# Patient Record
Sex: Female | Born: 1960 | Race: Black or African American | Hispanic: No | Marital: Single | Smoking: Current every day smoker
Health system: Southern US, Community
[De-identification: ages and names within clinical notes are randomized; demographics above are authoritative.]

## PROBLEM LIST (undated history)

## (undated) DIAGNOSIS — F329 Major depressive disorder, single episode, unspecified: Secondary | ICD-10-CM

## (undated) DIAGNOSIS — F419 Anxiety disorder, unspecified: Secondary | ICD-10-CM

## (undated) DIAGNOSIS — C50212 Malignant neoplasm of upper-inner quadrant of left female breast: Secondary | ICD-10-CM

## (undated) DIAGNOSIS — C50919 Malignant neoplasm of unspecified site of unspecified female breast: Secondary | ICD-10-CM

## (undated) DIAGNOSIS — F32A Depression, unspecified: Secondary | ICD-10-CM

## (undated) DIAGNOSIS — I10 Essential (primary) hypertension: Secondary | ICD-10-CM

## (undated) HISTORY — DX: Malignant neoplasm of unspecified site of unspecified female breast: C50.919

## (undated) HISTORY — DX: Malignant neoplasm of upper-inner quadrant of left female breast: C50.212

## (undated) HISTORY — PX: BREAST SURGERY: SHX581

---

## 1997-09-09 ENCOUNTER — Inpatient Hospital Stay (HOSPITAL_COMMUNITY): Admission: AD | Admit: 1997-09-09 | Discharge: 1997-09-09 | Payer: Self-pay | Admitting: Obstetrics & Gynecology

## 1997-11-27 ENCOUNTER — Other Ambulatory Visit: Admission: RE | Admit: 1997-11-27 | Discharge: 1997-11-27 | Payer: Self-pay | Admitting: Obstetrics and Gynecology

## 1997-11-29 ENCOUNTER — Inpatient Hospital Stay (HOSPITAL_COMMUNITY): Admission: AD | Admit: 1997-11-29 | Discharge: 1997-11-29 | Payer: Self-pay

## 1997-12-13 ENCOUNTER — Inpatient Hospital Stay (HOSPITAL_COMMUNITY): Admission: AD | Admit: 1997-12-13 | Discharge: 1997-12-13 | Payer: Self-pay | Admitting: Obstetrics and Gynecology

## 1998-02-13 ENCOUNTER — Other Ambulatory Visit: Admission: RE | Admit: 1998-02-13 | Discharge: 1998-02-13 | Payer: Self-pay | Admitting: Obstetrics and Gynecology

## 1998-02-27 ENCOUNTER — Ambulatory Visit (HOSPITAL_COMMUNITY): Admission: RE | Admit: 1998-02-27 | Discharge: 1998-02-27 | Payer: Self-pay | Admitting: Obstetrics and Gynecology

## 1998-03-25 ENCOUNTER — Inpatient Hospital Stay (HOSPITAL_COMMUNITY): Admission: AD | Admit: 1998-03-25 | Discharge: 1998-03-25 | Payer: Self-pay | Admitting: Obstetrics and Gynecology

## 1998-04-02 ENCOUNTER — Inpatient Hospital Stay (HOSPITAL_COMMUNITY): Admission: AD | Admit: 1998-04-02 | Discharge: 1998-04-02 | Payer: Self-pay | Admitting: Obstetrics and Gynecology

## 1998-04-09 ENCOUNTER — Inpatient Hospital Stay (HOSPITAL_COMMUNITY): Admission: AD | Admit: 1998-04-09 | Discharge: 1998-04-13 | Payer: Self-pay | Admitting: Obstetrics and Gynecology

## 1998-11-03 ENCOUNTER — Other Ambulatory Visit: Admission: RE | Admit: 1998-11-03 | Discharge: 1998-11-03 | Payer: Self-pay | Admitting: Obstetrics & Gynecology

## 2000-11-17 ENCOUNTER — Emergency Department (HOSPITAL_COMMUNITY): Admission: EM | Admit: 2000-11-17 | Discharge: 2000-11-17 | Payer: Self-pay | Admitting: Emergency Medicine

## 2000-11-17 ENCOUNTER — Encounter: Payer: Self-pay | Admitting: Emergency Medicine

## 2001-03-21 ENCOUNTER — Emergency Department (HOSPITAL_COMMUNITY): Admission: EM | Admit: 2001-03-21 | Discharge: 2001-03-21 | Payer: Self-pay | Admitting: Emergency Medicine

## 2002-03-09 ENCOUNTER — Encounter (INDEPENDENT_AMBULATORY_CARE_PROVIDER_SITE_OTHER): Payer: Self-pay | Admitting: *Deleted

## 2002-03-09 ENCOUNTER — Encounter (INDEPENDENT_AMBULATORY_CARE_PROVIDER_SITE_OTHER): Payer: Self-pay | Admitting: Family Medicine

## 2002-11-27 ENCOUNTER — Emergency Department (HOSPITAL_COMMUNITY): Admission: EM | Admit: 2002-11-27 | Discharge: 2002-11-27 | Payer: Self-pay | Admitting: *Deleted

## 2002-11-27 ENCOUNTER — Encounter: Payer: Self-pay | Admitting: *Deleted

## 2003-02-22 ENCOUNTER — Encounter: Admission: RE | Admit: 2003-02-22 | Discharge: 2003-02-22 | Payer: Self-pay | Admitting: Family Medicine

## 2003-12-12 ENCOUNTER — Inpatient Hospital Stay (HOSPITAL_COMMUNITY): Admission: AD | Admit: 2003-12-12 | Discharge: 2003-12-12 | Payer: Self-pay | Admitting: Obstetrics & Gynecology

## 2003-12-16 ENCOUNTER — Inpatient Hospital Stay (HOSPITAL_COMMUNITY): Admission: AD | Admit: 2003-12-16 | Discharge: 2003-12-16 | Payer: Self-pay | Admitting: Obstetrics and Gynecology

## 2004-01-03 ENCOUNTER — Inpatient Hospital Stay (HOSPITAL_COMMUNITY): Admission: AD | Admit: 2004-01-03 | Discharge: 2004-01-03 | Payer: Self-pay | Admitting: *Deleted

## 2004-06-09 ENCOUNTER — Inpatient Hospital Stay (HOSPITAL_COMMUNITY): Admission: AD | Admit: 2004-06-09 | Discharge: 2004-06-09 | Payer: Self-pay | Admitting: *Deleted

## 2005-08-11 ENCOUNTER — Emergency Department (HOSPITAL_COMMUNITY): Admission: EM | Admit: 2005-08-11 | Discharge: 2005-08-11 | Payer: Self-pay | Admitting: Emergency Medicine

## 2006-10-06 DIAGNOSIS — M2390 Unspecified internal derangement of unspecified knee: Secondary | ICD-10-CM

## 2006-10-07 ENCOUNTER — Encounter (INDEPENDENT_AMBULATORY_CARE_PROVIDER_SITE_OTHER): Payer: Self-pay | Admitting: *Deleted

## 2006-11-10 ENCOUNTER — Ambulatory Visit: Payer: Self-pay | Admitting: Internal Medicine

## 2006-12-12 ENCOUNTER — Ambulatory Visit: Payer: Self-pay | Admitting: Internal Medicine

## 2006-12-19 ENCOUNTER — Ambulatory Visit: Payer: Self-pay | Admitting: Internal Medicine

## 2006-12-19 DIAGNOSIS — E785 Hyperlipidemia, unspecified: Secondary | ICD-10-CM

## 2006-12-19 DIAGNOSIS — E782 Mixed hyperlipidemia: Secondary | ICD-10-CM | POA: Insufficient documentation

## 2006-12-19 LAB — CONVERTED CEMR LAB
Cholesterol: 189 mg/dL
Creatinine, Ser: 0.91 mg/dL
HDL: 52 mg/dL
Triglycerides: 173 mg/dL

## 2007-01-06 ENCOUNTER — Ambulatory Visit: Payer: Self-pay | Admitting: Internal Medicine

## 2007-01-06 LAB — CONVERTED CEMR LAB: Platelets: 285 10*3/uL

## 2007-03-09 ENCOUNTER — Encounter (INDEPENDENT_AMBULATORY_CARE_PROVIDER_SITE_OTHER): Payer: Self-pay | Admitting: Internal Medicine

## 2007-03-09 DIAGNOSIS — F3160 Bipolar disorder, current episode mixed, unspecified: Secondary | ICD-10-CM | POA: Insufficient documentation

## 2007-03-09 DIAGNOSIS — F17200 Nicotine dependence, unspecified, uncomplicated: Secondary | ICD-10-CM | POA: Insufficient documentation

## 2007-03-09 DIAGNOSIS — R030 Elevated blood-pressure reading, without diagnosis of hypertension: Secondary | ICD-10-CM | POA: Insufficient documentation

## 2007-06-22 ENCOUNTER — Encounter (INDEPENDENT_AMBULATORY_CARE_PROVIDER_SITE_OTHER): Payer: Self-pay | Admitting: Family Medicine

## 2008-05-22 ENCOUNTER — Ambulatory Visit: Payer: Self-pay | Admitting: Family Medicine

## 2008-05-22 DIAGNOSIS — N644 Mastodynia: Secondary | ICD-10-CM

## 2008-05-22 DIAGNOSIS — M7120 Synovial cyst of popliteal space [Baker], unspecified knee: Secondary | ICD-10-CM | POA: Insufficient documentation

## 2008-05-23 ENCOUNTER — Encounter (INDEPENDENT_AMBULATORY_CARE_PROVIDER_SITE_OTHER): Payer: Self-pay | Admitting: Family Medicine

## 2008-05-23 ENCOUNTER — Ambulatory Visit (HOSPITAL_COMMUNITY): Admission: RE | Admit: 2008-05-23 | Discharge: 2008-05-23 | Payer: Self-pay | Admitting: Family Medicine

## 2008-05-24 ENCOUNTER — Ambulatory Visit: Payer: Self-pay | Admitting: Vascular Surgery

## 2008-05-24 ENCOUNTER — Encounter (INDEPENDENT_AMBULATORY_CARE_PROVIDER_SITE_OTHER): Payer: Self-pay | Admitting: Family Medicine

## 2008-05-24 ENCOUNTER — Ambulatory Visit (HOSPITAL_COMMUNITY): Admission: RE | Admit: 2008-05-24 | Discharge: 2008-05-24 | Payer: Self-pay | Admitting: Family Medicine

## 2008-05-24 ENCOUNTER — Telehealth (INDEPENDENT_AMBULATORY_CARE_PROVIDER_SITE_OTHER): Payer: Self-pay | Admitting: *Deleted

## 2008-06-11 ENCOUNTER — Encounter (INDEPENDENT_AMBULATORY_CARE_PROVIDER_SITE_OTHER): Payer: Self-pay | Admitting: Family Medicine

## 2008-06-13 ENCOUNTER — Telehealth (INDEPENDENT_AMBULATORY_CARE_PROVIDER_SITE_OTHER): Payer: Self-pay | Admitting: Internal Medicine

## 2008-06-27 ENCOUNTER — Emergency Department (HOSPITAL_COMMUNITY): Admission: EM | Admit: 2008-06-27 | Discharge: 2008-06-27 | Payer: Self-pay | Admitting: Emergency Medicine

## 2008-10-21 ENCOUNTER — Telehealth (INDEPENDENT_AMBULATORY_CARE_PROVIDER_SITE_OTHER): Payer: Self-pay | Admitting: *Deleted

## 2015-06-09 ENCOUNTER — Encounter: Payer: Self-pay | Admitting: *Deleted

## 2015-06-09 ENCOUNTER — Telehealth: Payer: Self-pay | Admitting: *Deleted

## 2015-06-09 DIAGNOSIS — Z170 Estrogen receptor positive status [ER+]: Secondary | ICD-10-CM

## 2015-06-09 DIAGNOSIS — C50212 Malignant neoplasm of upper-inner quadrant of left female breast: Secondary | ICD-10-CM

## 2015-06-09 HISTORY — DX: Malignant neoplasm of upper-inner quadrant of left female breast: C50.212

## 2015-06-09 NOTE — Telephone Encounter (Signed)
Left vm for pt to return call to give detailed instructions for [facility] on 11/2. Janett Billow at [place] confirmed appt with pt on 10/28 for 11/2 at 0830.

## 2015-06-10 ENCOUNTER — Telehealth: Payer: Self-pay | Admitting: *Deleted

## 2015-06-10 NOTE — Telephone Encounter (Signed)
Confirmed appt for [facility] on 11/2 at 0830. Gave instructions and directions.

## 2015-06-11 ENCOUNTER — Other Ambulatory Visit: Payer: Self-pay | Admitting: *Deleted

## 2015-06-11 ENCOUNTER — Encounter: Payer: Self-pay | Admitting: *Deleted

## 2015-06-11 ENCOUNTER — Ambulatory Visit (HOSPITAL_BASED_OUTPATIENT_CLINIC_OR_DEPARTMENT_OTHER): Payer: Medicaid Other | Admitting: Oncology

## 2015-06-11 ENCOUNTER — Encounter: Payer: Self-pay | Admitting: Oncology

## 2015-06-11 ENCOUNTER — Ambulatory Visit: Payer: Medicaid Other | Admitting: Physical Therapy

## 2015-06-11 ENCOUNTER — Ambulatory Visit
Admission: RE | Admit: 2015-06-11 | Discharge: 2015-06-11 | Disposition: A | Discharge status: Home / Self Care | Payer: Medicaid Other | Source: Ambulatory Visit | Attending: Radiation Oncology | Admitting: Radiation Oncology

## 2015-06-11 ENCOUNTER — Other Ambulatory Visit: Payer: Self-pay | Admitting: General Surgery

## 2015-06-11 ENCOUNTER — Other Ambulatory Visit (HOSPITAL_BASED_OUTPATIENT_CLINIC_OR_DEPARTMENT_OTHER): Payer: Medicaid Other

## 2015-06-11 ENCOUNTER — Encounter: Payer: Self-pay | Admitting: Skilled Nursing Facility1

## 2015-06-11 VITALS — BP 136/78 | HR 84 | Temp 98.5°F | Resp 18 | Ht 67.0 in | Wt 134.4 lb

## 2015-06-11 DIAGNOSIS — C50212 Malignant neoplasm of upper-inner quadrant of left female breast: Secondary | ICD-10-CM

## 2015-06-11 DIAGNOSIS — Z720 Tobacco use: Secondary | ICD-10-CM | POA: Diagnosis not present

## 2015-06-11 DIAGNOSIS — Z170 Estrogen receptor positive status [ER+]: Secondary | ICD-10-CM | POA: Diagnosis not present

## 2015-06-11 DIAGNOSIS — F3160 Bipolar disorder, current episode mixed, unspecified: Secondary | ICD-10-CM

## 2015-06-11 DIAGNOSIS — F319 Bipolar disorder, unspecified: Secondary | ICD-10-CM | POA: Diagnosis not present

## 2015-06-11 DIAGNOSIS — C50112 Malignant neoplasm of central portion of left female breast: Secondary | ICD-10-CM

## 2015-06-11 DIAGNOSIS — F17200 Nicotine dependence, unspecified, uncomplicated: Secondary | ICD-10-CM

## 2015-06-11 LAB — CBC WITH DIFFERENTIAL/PLATELET
BASO%: 0.4 % (ref 0.0–2.0)
Basophils Absolute: 0 10*3/uL (ref 0.0–0.1)
EOS%: 1.1 % (ref 0.0–7.0)
Eosinophils Absolute: 0.1 10*3/uL (ref 0.0–0.5)
HEMATOCRIT: 40.9 % (ref 34.8–46.6)
HEMOGLOBIN: 13.8 g/dL (ref 11.6–15.9)
LYMPH#: 3.5 10*3/uL — AB (ref 0.9–3.3)
LYMPH%: 46.9 % (ref 14.0–49.7)
MCH: 30 pg (ref 25.1–34.0)
MCHC: 33.8 g/dL (ref 31.5–36.0)
MCV: 88.6 fL (ref 79.5–101.0)
MONO#: 0.6 10*3/uL (ref 0.1–0.9)
MONO%: 8.6 % (ref 0.0–14.0)
NEUT#: 3.2 10*3/uL (ref 1.5–6.5)
NEUT%: 43 % (ref 38.4–76.8)
Platelets: 247 10*3/uL (ref 145–400)
RBC: 4.62 10*6/uL (ref 3.70–5.45)
RDW: 14.4 % (ref 11.2–14.5)
WBC: 7.4 10*3/uL (ref 3.9–10.3)

## 2015-06-11 LAB — COMPREHENSIVE METABOLIC PANEL (CC13)
ALBUMIN: 4.4 g/dL (ref 3.5–5.0)
ALK PHOS: 94 U/L (ref 40–150)
ALT: 17 U/L (ref 0–55)
AST: 16 U/L (ref 5–34)
Anion Gap: 9 mEq/L (ref 3–11)
BUN: 15.6 mg/dL (ref 7.0–26.0)
CALCIUM: 10.5 mg/dL — AB (ref 8.4–10.4)
CHLORIDE: 108 meq/L (ref 98–109)
CO2: 26 mEq/L (ref 22–29)
CREATININE: 0.9 mg/dL (ref 0.6–1.1)
EGFR: 83 mL/min/{1.73_m2} — ABNORMAL LOW (ref >90)
GLUCOSE: 122 mg/dL (ref 70–140)
Potassium: 3.7 mEq/L (ref 3.5–5.1)
Sodium: 143 mEq/L (ref 136–145)
Total Bilirubin: 0.37 mg/dL (ref 0.20–1.20)
Total Protein: 7.4 g/dL (ref 6.4–8.3)

## 2015-06-11 NOTE — Progress Notes (Signed)
[place]  Telephone:([phone] Fax:([phone]) [phone]     ID: LAKEYA MULKA DOB: Oct 07, 1960  MR#: [id number]  EGB#:[id number]  Patient Care Team: Mack Hook, MD as PCP - General [name], MD as Consulting Physician (General Surgery) Chauncey Cruel, MD as Consulting Physician (Oncology) Arloa Koh, MD as Consulting Physician (Radiation Oncology) Mauro Kaufmann, RN as Registered Nurse Rockwell Germany, RN as Registered Nurse PCP: Mack Hook, MD GYN: OTHER MD: Lucianne Lei MD  CHIEF COMPLAINT: Locally advanced breast cancer  CURRENT TREATMENT: Awaiting definitive surgery   BREAST CANCER HISTORY: Chloee tells me she has always had many breast cysts, and that the 1 in the left breast that turned out to be cancer didn't seem any different from any of the other ones. She did notice some skin dimpling so after a few months she brought it to the attention of Dr. Criss Rosales and on 05/28/2015 she underwent bilateral diagnostic mammography with tomosynthesis and left breast ultrasonography at [facility]. The most recent mammogram prior to this was 2009. The breast density was category C. In the left breast upper inner quadrant there was a 4 cm irregular mass associated with architectural distortion and nipple retraction. By ultrasound this measured 4 cm, and had micro-lobulated margins. There was a lymph node in the left axilla with a focus of cortical thickening.  On 06/05/2015 the patient underwent biopsy of the breast mass in question. This showed ([id number] [id number]) and invasive ductal carcinoma, grade 2 or 3, estrogen receptor 95% positive, progesterone receptor 40% positive, both with strong staining intensity, with an MIB-1 of 20%, and HER-2 equivocal for amplification, the signals ratio being 1.43 but the number per cell 4.38. Additional studies are pending to clarify the HER-2 status.  Biopsy of the abnormal lymph node in the left axilla was also scheduled for  06/05/2015 but the patient refused  The patient's subsequent history is as detailed below  INTERVAL HISTORY: Shauna was evaluated in the multidisciplinary breast cancer clinic 06/11/2015 accompanied by her parents and by her son Dallas Breeding.Her case was also presented in the multidisciplinary breast cancer conference that same morning. At that time a preliminary plan was proposed: Consideration of an MRI, neoadjuvant chemotherapy, and port placement with lymph node biopsy  REVIEW OF SYSTEMS: Aside from the mass itself, there were no specific symptoms leading to the original mammogram, which was routinely scheduled. The patient denies unusual headaches, visual changes, nausea, vomiting, stiff neck, dizziness, or gait imbalance. There has been no cough, phlegm production, or pleurisy, no chest pain or pressure, and no change in bowel or bladder habits. The patient denies fever, rash, bleeding, unexplained fatigue or unexplained weight loss. She does complain of hot flashes. She does not exercise regularly. She continues to smoke greater than a pack per day. A detailed review of systems was otherwise entirely negative.   PAST MEDICAL HISTORY: Past Medical History  Diagnosis Date  . Breast cancer of upper-inner quadrant of left female breast ([place]) 06/09/2015  . Breast cancer ([place])   . Bipolar disorder ([place])     PAST SURGICAL HISTORY: History reviewed. No pertinent past surgical history.  FAMILY HISTORY History reviewed. No pertinent family history. The patient's parents are in their mid to late 1s. [name] had 4 brothers, 2 sisters. There is a history of prostate and brain cancer on the maternal side but no history of ovarian or breast cancer  GYNECOLOGIC HISTORY:  No LMP recorded. Menarche age 3, first live birth age 70. The patient is  [place] P2. She stopped having periods at age 26. She did not take hormone replacement. She never used oral contraceptives.  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 At home is just she and her "baby" [name], 58, who is going to school. Son Osnabrock, 20, works for the [employer] and lives in [place]. The patient has 2 grandchildren. She had dense a local [employer] (harvest gathering".    ADVANCED DIRECTIVES: Not in place. At the initial clinic visit 06/11/2015 the patient was given the appropriate forms to complete and notarize at her discretion.     HEALTH MAINTENANCE: Social History  Substance Use Topics  . Smoking status: Current Every Day Smoker -- 1.00 packs/day    Types: Cigarettes  . Smokeless tobacco: None  . Alcohol Use: 0.6 oz/week    1 Glasses of wine per week     Colonoscopy: Never  PAP:  Bone density: Never  Lipid panel:  Allergies  Allergen Reactions  . Codeine     REACTION: unknown reaction    No current outpatient prescriptions on file.   No current facility-administered medications for this visit.    OBJECTIVE: Middle-aged African-American woman who appears stated age 54 Vitals:   06/11/15 0902  BP: 136/78  Pulse: 84  Temp: 98.5 F (36.9 C)  Resp: 18     Body mass index is 21.04 kg/(m^2).    ECOG FS:1 - Symptomatic but completely ambulatory  Ocular: Sclerae unicteric, extraocular movements intact Ear-nose-throat: Oropharynx clear and moist Lymphatic: No cervical or supraclavicular adenopathy Lungs no rales or rhonchi, good excursion bilaterally Heart regular rate and rhythm, no murmur appreciated Abd soft, nontender, positive bowel sounds, no masses appreciated MSK no focal spinal tenderness, no joint edema Neuro: non-focal, well-oriented, wary and distrustful alternating with appropriate affect Breasts: The right breast is unremarkable. There is an easily palpable mass in the left breast upper inner quadrant, measuring approximately 4 cm. There is no overlying erythema. There is mild skin dimpling. I do not palpate an axillary  lymph node.   LAB RESULTS:  CMP     Component Value Date/Time   NA 143 06/11/2015 0849   K 3.7 06/11/2015 0849   CO2 26 06/11/2015 0849   GLUCOSE 122 06/11/2015 0849   BUN 15.6 06/11/2015 0849   CREATININE 0.9 06/11/2015 0849   CREATININE 0.91 12/19/2006   CALCIUM 10.5* 06/11/2015 0849   PROT 7.4 06/11/2015 0849   ALBUMIN 4.4 06/11/2015 0849   AST 16 06/11/2015 0849   ALT 17 06/11/2015 0849   ALKPHOS 94 06/11/2015 0849   BILITOT 0.37 06/11/2015 0849    INo results found for: SPEP, UPEP  Lab Results  Component Value Date   WBC 7.4 06/11/2015   NEUTROABS 3.2 06/11/2015   HGB 13.8 06/11/2015   HCT 40.9 06/11/2015   MCV 88.6 06/11/2015   PLT 247 06/11/2015      Chemistry      Component Value Date/Time   NA 143 06/11/2015 0849   K 3.7 06/11/2015 0849   CO2 26 06/11/2015 0849   BUN 15.6 06/11/2015 0849   CREATININE 0.9 06/11/2015 0849   CREATININE 0.91 12/19/2006      Component Value Date/Time   CALCIUM 10.5* 06/11/2015 0849   ALKPHOS 94 06/11/2015 0849   AST 16 06/11/2015 0849   ALT 17 06/11/2015 0849   BILITOT 0.37 06/11/2015 0849       No results  found for: LABCA2  No components found for: WTUUE280  No results for input(s): INR in the last 168 hours.  Urinalysis No results found for: COLORURINE, APPEARANCEUR, LABSPEC, PHURINE, GLUCOSEU, HGBUR, BILIRUBINUR, KETONESUR, PROTEINUR, UROBILINOGEN, NITRITE, LEUKOCYTESUR  STUDIES: No results found.  ASSESSMENT: 54 y.o. [place] woman status post left breast biopsy 06/05/2015 for a clinical T2 NX, stage II or 3 invasive ductal carcinoma, estrogen and progesterone receptor positive, with an MIB-1 of 20%, and equivocal HER-2 amplification  (a) the patient refused biopsy of a suspicious left axillary lymph node  (1) definitive surgery planned as left mastectomy with sentinel lymph node sampling with concurrent port placement  (2) adjuvant chemotherapy with trastuzuma/ pertuzumab as appropriate to follow  surgery  (3) adjuvant radiation to follow chemotherapy as appropriate  (4) adjuvant anti-estrogens to follow local treatment  (5) bipolar disorder: The patient refuses medication at this time  (6) tobacco abuse disorder: The patient has been strongly advised to quit  PLAN: We spent the better part of today's hour-long appointment discussing the biology of breast cancer in general, and the specifics of the patient's tumor in particular. Shirlean Mylar understands she has locally advanced breast cancer. If she would allow [place] to biopsy her axilla, we would be able to give her a more definitive stage. However she is reluctant to have any procedures including needle sticks for access performed. This I think is going to impact our planning.   In general, we recommend lumpectomy if at all possible, because there is no survival advantage as compared to mastectomy so long as radiation follows. If there are positive margins we go back and do additional surgery. With Tuana I think were going to have one chance to cure her. If she has a lumpectomy with positive margins and refuses further surgery or if she has lumpectomy and refuses radiation, then the chance of cure would be compromised. Taking also into account the fact that the mass in her breast is large, and her breast itself is small, so that cosmetically we would not get a good result from a wide lumpectomy, my recommendation to her is to proceed with mastectomy and sentinel lymph node sampling. She can have the port placed at the same time This would be followed by chemotherapy, which assuming her echocardiogram is normal would consist of the standard doxorubicin/ cyclophosphamide in dose dense fashion followed by weekly paclitaxel 12. This would be followed by radiation is appropriate.  Once local treatment has been completed, we would start anti-estrogens, most likely with anastrozole. That will be continued for a minimum of 5 years.  In addition I think  she would benefit from a PET scan to make sure we are not dealing with stage IV disease. Again she is reluctant to undergo any further testing or any procedure that involves pain even as minimal as needle access. Nevertheless my recommendation to her is that we start by making sure she does not have stage IV disease.  I discussed antidepressants with Saniyyah. She tells me she has tried many in the past and not of the more. They make things worse instead of better. That is why she does not take them in why she does not want to see a psychiatrist or counselor. We also discussed smoking cessation. This is not something she feels she can do now because she is too anxious given her recent diagnosis. She understands this may affect her healing from the upcoming surgery  The patient has a good understanding of the overall plan,  Which was given to her in writing. At this point she agrees with it. She knows the goal of treatment in her case is cure. She will call with any problems that may develop before her next visit here.  Chauncey Cruel, MD   06/11/2015 12:34 PM Medical Oncology and Hematology [facility] [address] [place], [place] [address] Tel. [phone]    Fax. [phone]

## 2015-06-11 NOTE — Progress Notes (Signed)
Subjective:     Patient ID: Catherine [name], female   DOB: Sep 28, 1960, 54 y.o.   MRN: [id number]  HPI   Review of Systems     Objective:   Physical Exam For the patient to understand and be given the tools to implement a healthy plant based diet during their cancer diagnosis.     Assessment:     Dr. Barry Dienes indicates the pt would not allow her to examine her and the physical therapist also states the pt would not allow her to examine her. The pt is not in a place of understanding or listening. Upon entering the room the pt continued to stare off into the corner and had her body turned toward the corner. Pts labs: calcium 10.5. Pts ht 5'7'', 134 pounds, BMI 21.1.      Plan:     Dietitian offered the folder of nutrition information to the pts son which he accepted.

## 2015-06-11 NOTE — Progress Notes (Signed)
Chart note: The patient declined to see me in consultation.  I spoke briefly with her son and father, and they understand recommendations for surgery to be followed by adjuvant chemotherapy.  Post mastectomy radiation therapy may or may not be indicated.  This will depend on her pathologic stage.

## 2015-06-11 NOTE — Progress Notes (Signed)
Clinical Social Work [place] Psychosocial Distress Screening [place]  Patient did not completed distress screening protocol, but presented very anxious and overwhelmed.  Clinical Social Worker met with patient and patients family in [facility] to assess for distress and other psychosocial needs. Patient did not communicate with CSW, but her family was very open to resources education and information. CSW offered support to patient and family and discussed common feeling and emotions when being diagnosed with cancer, and the importance of support during treatment. CSW informed patient of the support team and support services at [facility]. CSW provided contact information and encouraged patient to call with any questions or concerns.  Johnnye Lana, MSW, LCSW, OSW-C Clinical Social Worker [facility] [phone]

## 2015-06-12 ENCOUNTER — Telehealth: Payer: Self-pay | Admitting: *Deleted

## 2015-06-12 NOTE — Telephone Encounter (Signed)
Pt called to express a deep apology for her actions during clinic yesterday. Relate "my mind was in the wrong place". Validated pt actions and expressed receiving a cancer dx is upsetting and we are here to take very good care of her. Pt relate she would like to speak to Dr. Barry Dienes concerning surgery. I have reached out to Dr. Barry Dienes for her to call pt at number [phone] Gave pt encouragement and emotional support. Informed pt to call with needs or questions. Received verbal understanding.

## 2015-06-16 ENCOUNTER — Telehealth: Payer: Self-pay | Admitting: *Deleted

## 2015-06-16 NOTE — Telephone Encounter (Signed)
Pt called in question of scan. Discussed PET scan and need. Pt relate she would like to speak to Dr. Barry Dienes concerning "my tumor is a lot small than when I had my mammagram'". Spoke to Dr. Barry Dienes with pt concerns. Called central scheduling to discuss calling pt at new number to schedule her PET scan. Denies further needs at this time. Encourage pt to call with questions or concerns. Received verbal understanding.

## 2015-06-17 ENCOUNTER — Telehealth: Payer: Self-pay | Admitting: Oncology

## 2015-06-17 NOTE — Telephone Encounter (Signed)
Appointments made for echo and called and left a message with date and time

## 2015-06-19 ENCOUNTER — Ambulatory Visit (HOSPITAL_COMMUNITY): Admission: RE | Admit: 2015-06-19 | Payer: Medicaid Other | Source: Ambulatory Visit

## 2015-06-20 ENCOUNTER — Telehealth: Payer: Self-pay | Admitting: *Deleted

## 2015-06-20 NOTE — Telephone Encounter (Signed)
Called pt to assess needs relate doing well and without complaints.

## 2015-06-23 ENCOUNTER — Telehealth: Payer: Self-pay | Admitting: *Deleted

## 2015-06-23 NOTE — Telephone Encounter (Signed)
Called pt to schedule Echo. Pt relate she would like to have it on the 18th around the time of her pre-op appt. Pt request return call tomorrow with appt.

## 2015-06-24 ENCOUNTER — Telehealth: Payer: Self-pay | Admitting: *Deleted

## 2015-06-24 NOTE — Telephone Encounter (Signed)
Scheduled and confirmed echo appt with pt for 11/18 at 10:00 at [facility]. Gave instructions and directions. Denies further needs at this time.

## 2015-06-27 ENCOUNTER — Encounter (HOSPITAL_COMMUNITY)
Admission: RE | Admit: 2015-06-27 | Discharge: 2015-06-27 | Disposition: A | Discharge status: Home / Self Care | Payer: Medicaid Other | Source: Ambulatory Visit | Attending: General Surgery | Admitting: General Surgery

## 2015-06-27 ENCOUNTER — Ambulatory Visit (HOSPITAL_COMMUNITY)
Admission: RE | Admit: 2015-06-27 | Discharge: 2015-06-27 | Disposition: A | Discharge status: Home / Self Care | Payer: Medicaid Other | Source: Ambulatory Visit | Attending: Oncology | Admitting: Oncology

## 2015-06-27 ENCOUNTER — Encounter (HOSPITAL_COMMUNITY): Payer: Self-pay

## 2015-06-27 ENCOUNTER — Encounter: Payer: Self-pay | Admitting: *Deleted

## 2015-06-27 DIAGNOSIS — Z01812 Encounter for preprocedural laboratory examination: Secondary | ICD-10-CM | POA: Insufficient documentation

## 2015-06-27 DIAGNOSIS — C50912 Malignant neoplasm of unspecified site of left female breast: Secondary | ICD-10-CM | POA: Diagnosis not present

## 2015-06-27 DIAGNOSIS — C50112 Malignant neoplasm of central portion of left female breast: Secondary | ICD-10-CM

## 2015-06-27 HISTORY — DX: Anxiety disorder, unspecified: F41.9

## 2015-06-27 HISTORY — DX: Depression, unspecified: F32.A

## 2015-06-27 HISTORY — DX: Major depressive disorder, single episode, unspecified: F32.9

## 2015-06-27 LAB — BASIC METABOLIC PANEL
Anion gap: 7 (ref 5–15)
BUN: 14 mg/dL (ref 6–20)
CHLORIDE: 110 mmol/L (ref 101–111)
CO2: 26 mmol/L (ref 22–32)
CREATININE: 0.94 mg/dL (ref 0.44–1.00)
Calcium: 10.1 mg/dL (ref 8.9–10.3)
GFR calc Af Amer: >60 mL/min (ref >60)
GFR calc non Af Amer: >60 mL/min (ref >60)
GLUCOSE: 122 mg/dL — AB (ref 65–99)
POTASSIUM: 3.6 mmol/L (ref 3.5–5.1)
SODIUM: 143 mmol/L (ref 135–145)

## 2015-06-27 LAB — CBC
HCT: 41.4 % (ref 36.0–46.0)
HEMOGLOBIN: 14.2 g/dL (ref 12.0–15.0)
MCH: 30.4 pg (ref 26.0–34.0)
MCHC: 34.3 g/dL (ref 30.0–36.0)
MCV: 88.7 fL (ref 78.0–100.0)
Platelets: 244 10*3/uL (ref 150–400)
RBC: 4.67 MIL/uL (ref 3.87–5.11)
RDW: 14.9 % (ref 11.5–15.5)
WBC: 8.6 10*3/uL (ref 4.0–10.5)

## 2015-06-27 NOTE — Pre-Procedure Instructions (Signed)
    Catherine [name]  06/27/2015      [facility], [place] [address] Phone: [phone] Fax: [phone]    Your procedure is scheduled on 07/01/15.  Report to [facility] Admitting at 8 A.M.  Call this number if you have problems the morning of surgery:  [phone]   Remember:  Do not eat food or drink liquids after midnight.  Take these medicines the morning of surgery with A SIP OF WATER --none   Do not wear jewelry, make-up or nail polish.  Do not wear lotions, powders, or perfumes.  You may wear deodorant.  Do not shave 48 hours prior to surgery.  Men may shave face and neck.  Do not bring valuables to the hospital.  [facility] is not responsible for any belongings or valuables.  Contacts, dentures or bridgework may not be worn into surgery.  Leave your suitcase in the car.  After surgery it may be brought to your room.  For patients admitted to the hospital, discharge time will be determined by your treatment team.  Patients discharged the day of surgery will not be allowed to drive home.   Name and phone number of your driver:   Special instructions:    Please read over the following fact sheets that you were given. Pain Booklet, Coughing and Deep Breathing and Surgical Site Infection Prevention

## 2015-06-27 NOTE — Progress Notes (Signed)
Pt called to relay her pre-admission testing was moved to 9:00AM and that she needed to make sure she would be able to have echo right afterwards. Relayed that I still saw her appt for 1100 but would call and make sure appt did indeed get changed. Pt relay she would not come back. Called pre-admitting and echo to discuss working pt in. Departments relayed they would try and work her in. Called pt to let her know that they were trying to work her in.

## 2015-06-28 NOTE — Progress Notes (Signed)
Patient called requesting wording on consent. Patient then got upset and started to cry on phone. I listed to patient and what she was currently going through. I advised patient that she was not alone in her journey. Patient mentioned she was very comfortable talking with her navigator Dawn and I advised patient to contact her or Vernie Shanks if she was feeling overwhelmed. Patient reports that she will call [place].

## 2015-06-30 ENCOUNTER — Encounter (HOSPITAL_COMMUNITY): Payer: Medicaid Other

## 2015-06-30 MED ORDER — CEFAZOLIN SODIUM-DEXTROSE 2-3 GM-% IV SOLR
2.0000 g | INTRAVENOUS | Status: AC
  Administered 2015-07-01: 2 g via INTRAVENOUS
  Filled 2015-06-30: qty 50

## 2015-07-01 ENCOUNTER — Encounter (HOSPITAL_COMMUNITY): Payer: Self-pay | Admitting: *Deleted

## 2015-07-01 ENCOUNTER — Ambulatory Visit (HOSPITAL_COMMUNITY): Payer: Medicaid Other | Admitting: Anesthesiology

## 2015-07-01 ENCOUNTER — Encounter (HOSPITAL_COMMUNITY)
Admission: RE | Disposition: A | Discharge status: Home / Self Care | Payer: Self-pay | Source: Ambulatory Visit | Attending: General Surgery

## 2015-07-01 ENCOUNTER — Inpatient Hospital Stay (HOSPITAL_COMMUNITY)
Admission: RE | Admit: 2015-07-01 | Discharge: 2015-07-04 | DRG: 580 | Disposition: A | Discharge status: Home / Self Care | Payer: Medicaid Other | Source: Ambulatory Visit | Attending: General Surgery | Admitting: General Surgery

## 2015-07-01 ENCOUNTER — Ambulatory Visit (HOSPITAL_COMMUNITY): Payer: Medicaid Other

## 2015-07-01 DIAGNOSIS — Y838 Other surgical procedures as the cause of abnormal reaction of the patient, or of later complication, without mention of misadventure at the time of the procedure: Secondary | ICD-10-CM | POA: Diagnosis not present

## 2015-07-01 DIAGNOSIS — E7800 Pure hypercholesterolemia, unspecified: Secondary | ICD-10-CM | POA: Diagnosis present

## 2015-07-01 DIAGNOSIS — F319 Bipolar disorder, unspecified: Secondary | ICD-10-CM | POA: Diagnosis present

## 2015-07-01 DIAGNOSIS — C50212 Malignant neoplasm of upper-inner quadrant of left female breast: Principal | ICD-10-CM | POA: Diagnosis present

## 2015-07-01 DIAGNOSIS — N63 Unspecified lump in unspecified breast: Secondary | ICD-10-CM

## 2015-07-01 DIAGNOSIS — N6453 Retraction of nipple: Secondary | ICD-10-CM | POA: Diagnosis present

## 2015-07-01 DIAGNOSIS — Z8249 Family history of ischemic heart disease and other diseases of the circulatory system: Secondary | ICD-10-CM

## 2015-07-01 DIAGNOSIS — F17210 Nicotine dependence, cigarettes, uncomplicated: Secondary | ICD-10-CM | POA: Diagnosis present

## 2015-07-01 DIAGNOSIS — D500 Iron deficiency anemia secondary to blood loss (chronic): Secondary | ICD-10-CM | POA: Diagnosis not present

## 2015-07-01 DIAGNOSIS — Z170 Estrogen receptor positive status [ER+]: Secondary | ICD-10-CM

## 2015-07-01 DIAGNOSIS — C50912 Malignant neoplasm of unspecified site of left female breast: Secondary | ICD-10-CM | POA: Diagnosis present

## 2015-07-01 DIAGNOSIS — I959 Hypotension, unspecified: Secondary | ICD-10-CM | POA: Diagnosis not present

## 2015-07-01 DIAGNOSIS — M96841 Postprocedural hematoma of a musculoskeletal structure following other procedure: Secondary | ICD-10-CM

## 2015-07-01 HISTORY — PX: MASTECTOMY W/ SENTINEL NODE BIOPSY: SHX2001

## 2015-07-01 HISTORY — PX: PORTACATH PLACEMENT: SHX2246

## 2015-07-01 SURGERY — MASTECTOMY WITH SENTINEL LYMPH NODE BIOPSY
Anesthesia: Regional

## 2015-07-01 MED ORDER — SUCCINYLCHOLINE CHLORIDE 20 MG/ML IJ SOLN
INTRAMUSCULAR | Status: DC | PRN
  Administered 2015-07-01: 100 mg via INTRAVENOUS

## 2015-07-01 MED ORDER — PHENYLEPHRINE HCL 10 MG/ML IJ SOLN
10.0000 mg | INTRAVENOUS | Status: DC | PRN
  Administered 2015-07-01: 20 ug/min via INTRAVENOUS

## 2015-07-01 MED ORDER — PHENYLEPHRINE 40 MCG/ML (10ML) SYRINGE FOR IV PUSH (FOR BLOOD PRESSURE SUPPORT)
PREFILLED_SYRINGE | INTRAVENOUS | Status: AC
  Filled 2015-07-01: qty 10

## 2015-07-01 MED ORDER — SODIUM CHLORIDE 0.9 % IJ SOLN
INTRAMUSCULAR | Status: DC | PRN
  Administered 2015-07-01: 70 mL via INTRAVENOUS

## 2015-07-01 MED ORDER — DEXAMETHASONE SODIUM PHOSPHATE 10 MG/ML IJ SOLN
INTRAMUSCULAR | Status: DC | PRN
  Administered 2015-07-01: 10 mg via INTRAVENOUS

## 2015-07-01 MED ORDER — SUCCINYLCHOLINE CHLORIDE 20 MG/ML IJ SOLN
INTRAMUSCULAR | Status: AC
  Filled 2015-07-01: qty 1

## 2015-07-01 MED ORDER — KETAMINE HCL 10 MG/ML IJ SOLN
INTRAMUSCULAR | Status: DC | PRN
  Administered 2015-07-01: 10 mg via INTRAVENOUS
  Administered 2015-07-01: 50 mg via INTRAVENOUS
  Administered 2015-07-01 (×2): 20 mg via INTRAVENOUS

## 2015-07-01 MED ORDER — ONDANSETRON HCL 4 MG/2ML IJ SOLN
4.0000 mg | Freq: Four times a day (QID) | INTRAMUSCULAR | Status: DC | PRN
  Administered 2015-07-01: 4 mg via INTRAVENOUS

## 2015-07-01 MED ORDER — FENTANYL CITRATE (PF) 250 MCG/5ML IJ SOLN
INTRAMUSCULAR | Status: AC
  Filled 2015-07-01: qty 5

## 2015-07-01 MED ORDER — ZOLPIDEM TARTRATE 5 MG PO TABS: 5.0000 mg | ORAL_TABLET | Freq: Every evening | ORAL | Status: DC | PRN

## 2015-07-01 MED ORDER — METHYLENE BLUE 1 % INJ SOLN
INTRAMUSCULAR | Status: AC
  Filled 2015-07-01: qty 10

## 2015-07-01 MED ORDER — LIDOCAINE HCL 4 % MT SOLN
OROMUCOSAL | Status: DC | PRN
  Administered 2015-07-01: 4 mL via TOPICAL

## 2015-07-01 MED ORDER — HYDROMORPHONE HCL 1 MG/ML IJ SOLN
INTRAMUSCULAR | Status: AC
  Filled 2015-07-01: qty 1

## 2015-07-01 MED ORDER — BUPIVACAINE-EPINEPHRINE 0.25% -1:200000 IJ SOLN
INTRAMUSCULAR | Status: DC | PRN
  Administered 2015-07-01: 30 mL

## 2015-07-01 MED ORDER — ACETAMINOPHEN 650 MG RE SUPP: 650.0000 mg | Freq: Four times a day (QID) | RECTAL | Status: DC | PRN

## 2015-07-01 MED ORDER — BUPIVACAINE-EPINEPHRINE (PF) 0.5% -1:200000 IJ SOLN
INTRAMUSCULAR | Status: DC | PRN
  Administered 2015-07-01: 30 mL

## 2015-07-01 MED ORDER — KCL IN DEXTROSE-NACL 20-5-0.45 MEQ/L-%-% IV SOLN
INTRAVENOUS | Status: AC
  Filled 2015-07-01: qty 1000

## 2015-07-01 MED ORDER — KCL IN DEXTROSE-NACL 20-5-0.45 MEQ/L-%-% IV SOLN
INTRAVENOUS | Status: DC
  Administered 2015-07-01: 100 mL/h via INTRAVENOUS
  Filled 2015-07-01 (×5): qty 1000

## 2015-07-01 MED ORDER — HYDROMORPHONE HCL 1 MG/ML IJ SOLN
0.2500 mg | INTRAMUSCULAR | Status: DC | PRN
Start: 2015-07-01 — End: 2015-07-01

## 2015-07-01 MED ORDER — LIDOCAINE HCL (CARDIAC) 20 MG/ML IV SOLN
INTRAVENOUS | Status: DC | PRN
  Administered 2015-07-01: 50 mg via INTRAVENOUS

## 2015-07-01 MED ORDER — DOCUSATE SODIUM 100 MG PO CAPS
100.0000 mg | ORAL_CAPSULE | Freq: Two times a day (BID) | ORAL | Status: DC
  Administered 2015-07-03: 100 mg via ORAL
  Filled 2015-07-01 (×4): qty 1

## 2015-07-01 MED ORDER — MEPERIDINE HCL 25 MG/ML IJ SOLN: 6.2500 mg | INTRAMUSCULAR | Status: DC | PRN

## 2015-07-01 MED ORDER — 0.9 % SODIUM CHLORIDE (POUR BTL) OPTIME
TOPICAL | Status: DC | PRN
  Administered 2015-07-01: 1000 mL

## 2015-07-01 MED ORDER — HEPARIN SOD (PORK) LOCK FLUSH 100 UNIT/ML IV SOLN
INTRAVENOUS | Status: DC | PRN
  Administered 2015-07-01: 500 [IU] via INTRAVENOUS

## 2015-07-01 MED ORDER — ONDANSETRON 4 MG PO TBDP
4.0000 mg | ORAL_TABLET | Freq: Four times a day (QID) | ORAL | Status: DC | PRN
  Filled 2015-07-01: qty 1

## 2015-07-01 MED ORDER — DIPHENHYDRAMINE HCL 12.5 MG/5ML PO ELIX: 12.5000 mg | ORAL_SOLUTION | Freq: Four times a day (QID) | ORAL | Status: DC | PRN

## 2015-07-01 MED ORDER — MIDAZOLAM HCL 2 MG/2ML IJ SOLN
INTRAMUSCULAR | Status: AC
  Filled 2015-07-01: qty 2

## 2015-07-01 MED ORDER — SODIUM CHLORIDE 0.9 % IV SOLN
INTRAVENOUS | Status: DC | PRN
  Administered 2015-07-01: 500 mL

## 2015-07-01 MED ORDER — OXYCODONE-ACETAMINOPHEN 5-325 MG PO TABS: 1.0000 | ORAL_TABLET | ORAL | Status: DC | PRN

## 2015-07-01 MED ORDER — LACTATED RINGERS IV SOLN
INTRAVENOUS | Status: DC
  Administered 2015-07-01: 09:00:00 via INTRAVENOUS

## 2015-07-01 MED ORDER — MIDAZOLAM HCL 2 MG/2ML IJ SOLN
INTRAMUSCULAR | Status: AC
  Administered 2015-07-01: 2 mg via INTRAVENOUS
  Filled 2015-07-01: qty 2

## 2015-07-01 MED ORDER — ONDANSETRON HCL 4 MG/2ML IJ SOLN
INTRAMUSCULAR | Status: AC
  Filled 2015-07-01: qty 2

## 2015-07-01 MED ORDER — GLYCOPYRROLATE 0.2 MG/ML IJ SOLN
INTRAMUSCULAR | Status: DC | PRN
  Administered 2015-07-01 (×2): 0.1 mg via INTRAVENOUS

## 2015-07-01 MED ORDER — MIDAZOLAM HCL 5 MG/5ML IJ SOLN
INTRAMUSCULAR | Status: DC | PRN
  Administered 2015-07-01: 2 mg via INTRAVENOUS

## 2015-07-01 MED ORDER — SODIUM CHLORIDE 0.9 % IJ SOLN
INTRAMUSCULAR | Status: DC | PRN
  Administered 2015-07-01: 2 mL via SUBCUTANEOUS

## 2015-07-01 MED ORDER — PROMETHAZINE HCL 25 MG/ML IJ SOLN
INTRAMUSCULAR | Status: AC
Start: 2015-07-01 — End: 2015-07-01
  Administered 2015-07-01: 6.25 mg via INTRAVENOUS
  Filled 2015-07-01: qty 1

## 2015-07-01 MED ORDER — HYDRALAZINE HCL 20 MG/ML IJ SOLN: 10.0000 mg | INTRAMUSCULAR | Status: DC | PRN

## 2015-07-01 MED ORDER — CEFAZOLIN SODIUM-DEXTROSE 2-3 GM-% IV SOLR
2.0000 g | Freq: Three times a day (TID) | INTRAVENOUS | Status: AC
  Administered 2015-07-01: 2 g via INTRAVENOUS
  Filled 2015-07-01: qty 50

## 2015-07-01 MED ORDER — PROPOFOL 10 MG/ML IV BOLUS
INTRAVENOUS | Status: AC
  Filled 2015-07-01: qty 20

## 2015-07-01 MED ORDER — FENTANYL CITRATE (PF) 100 MCG/2ML IJ SOLN
INTRAMUSCULAR | Status: AC
  Administered 2015-07-01: 100 ug via INTRAVENOUS
  Filled 2015-07-01: qty 2

## 2015-07-01 MED ORDER — PHENYLEPHRINE HCL 10 MG/ML IJ SOLN
INTRAMUSCULAR | Status: DC | PRN
  Administered 2015-07-01 (×5): 80 ug via INTRAVENOUS

## 2015-07-01 MED ORDER — MORPHINE SULFATE (PF) 2 MG/ML IV SOLN: 1.0000 mg | INTRAVENOUS | Status: DC | PRN

## 2015-07-01 MED ORDER — METHOCARBAMOL 500 MG PO TABS: 500.0000 mg | ORAL_TABLET | Freq: Four times a day (QID) | ORAL | Status: DC | PRN

## 2015-07-01 MED ORDER — BUPIVACAINE-EPINEPHRINE (PF) 0.25% -1:200000 IJ SOLN
INTRAMUSCULAR | Status: AC
  Filled 2015-07-01: qty 30

## 2015-07-01 MED ORDER — ACETAMINOPHEN 325 MG PO TABS: 650.0000 mg | ORAL_TABLET | Freq: Four times a day (QID) | ORAL | Status: DC | PRN

## 2015-07-01 MED ORDER — KETOROLAC TROMETHAMINE 15 MG/ML IJ SOLN
15.0000 mg | Freq: Four times a day (QID) | INTRAMUSCULAR | Status: DC
  Administered 2015-07-01 – 2015-07-02 (×3): 15 mg via INTRAVENOUS
  Filled 2015-07-01 (×4): qty 1

## 2015-07-01 MED ORDER — PROPOFOL 10 MG/ML IV BOLUS
INTRAVENOUS | Status: AC
  Filled 2015-07-01: qty 40

## 2015-07-01 MED ORDER — LACTATED RINGERS IV SOLN
INTRAVENOUS | Status: DC | PRN
  Administered 2015-07-01 (×2): via INTRAVENOUS

## 2015-07-01 MED ORDER — PROPOFOL 10 MG/ML IV BOLUS
INTRAVENOUS | Status: DC | PRN
  Administered 2015-07-01 (×2): 50 mg via INTRAVENOUS
  Administered 2015-07-01: 150 mg via INTRAVENOUS
  Administered 2015-07-01: 50 mg via INTRAVENOUS

## 2015-07-01 MED ORDER — FENTANYL CITRATE (PF) 100 MCG/2ML IJ SOLN
INTRAMUSCULAR | Status: DC | PRN
  Administered 2015-07-01: 100 ug via INTRAVENOUS
  Administered 2015-07-01 (×2): 50 ug via INTRAVENOUS
  Administered 2015-07-01: 100 ug via INTRAVENOUS

## 2015-07-01 MED ORDER — PROMETHAZINE HCL 25 MG/ML IJ SOLN
6.2500 mg | INTRAMUSCULAR | Status: DC | PRN
Start: 2015-07-01 — End: 2015-07-01
  Administered 2015-07-01: 6.25 mg via INTRAVENOUS

## 2015-07-01 MED ORDER — KETAMINE HCL 100 MG/ML IJ SOLN
INTRAMUSCULAR | Status: AC
  Filled 2015-07-01: qty 1

## 2015-07-01 MED ORDER — ONDANSETRON HCL 4 MG/2ML IJ SOLN
INTRAMUSCULAR | Status: DC | PRN
  Administered 2015-07-01 (×2): 4 mg via INTRAVENOUS

## 2015-07-01 MED ORDER — SIMETHICONE 80 MG PO CHEW: 40.0000 mg | CHEWABLE_TABLET | Freq: Four times a day (QID) | ORAL | Status: DC | PRN

## 2015-07-01 MED ORDER — HEPARIN SOD (PORK) LOCK FLUSH 100 UNIT/ML IV SOLN
INTRAVENOUS | Status: AC
  Filled 2015-07-01: qty 5

## 2015-07-01 MED ORDER — DIPHENHYDRAMINE HCL 50 MG/ML IJ SOLN: 12.5000 mg | Freq: Four times a day (QID) | INTRAMUSCULAR | Status: DC | PRN

## 2015-07-01 SURGICAL SUPPLY — 91 items
ADH SKN CLS LQ APL DERMABOND (GAUZE/BANDAGES/DRESSINGS) ×2
BAG DECANTER FOR FLEXI CONT (MISCELLANEOUS) ×4 IMPLANT
BINDER BREAST LRG (GAUZE/BANDAGES/DRESSINGS) ×2 IMPLANT
BINDER BREAST XLRG (GAUZE/BANDAGES/DRESSINGS) IMPLANT
BLADE SURG 11 STRL SS (BLADE) ×4 IMPLANT
BLADE SURG 15 STRL LF DISP TIS (BLADE) ×2 IMPLANT
BLADE SURG 15 STRL SS (BLADE) ×4
BNDG COHESIVE 4X5 TAN STRL (GAUZE/BANDAGES/DRESSINGS) ×4 IMPLANT
CANISTER SUCTION 2500CC (MISCELLANEOUS) ×8 IMPLANT
CHLORAPREP W/TINT 10.5 ML (MISCELLANEOUS) ×4 IMPLANT
CHLORAPREP W/TINT 26ML (MISCELLANEOUS) ×4 IMPLANT
CLIP TI LARGE 6 (CLIP) ×4 IMPLANT
CLIP TI MEDIUM 6 (CLIP) ×6 IMPLANT
CLIP TI WIDE RED SMALL 6 (CLIP) ×4 IMPLANT
CLOSURE WOUND 1/2 X4 (GAUZE/BANDAGES/DRESSINGS) ×1
CONT SPEC 4OZ CLIKSEAL STRL BL (MISCELLANEOUS) ×10 IMPLANT
COVER SURGICAL LIGHT HANDLE (MISCELLANEOUS) ×4 IMPLANT
COVER TRANSDUCER ULTRASND GEL (DRAPE) ×4 IMPLANT
CRADLE DONUT ADULT HEAD (MISCELLANEOUS) ×4 IMPLANT
DECANTER SPIKE VIAL GLASS SM (MISCELLANEOUS) ×8 IMPLANT
DERMABOND ADHESIVE PROPEN (GAUZE/BANDAGES/DRESSINGS) ×2
DERMABOND ADVANCED .7 DNX6 (GAUZE/BANDAGES/DRESSINGS) IMPLANT
DEVICE DISSECT PLASMABLAD 3.0S (MISCELLANEOUS) ×2 IMPLANT
DRAIN CHANNEL 19F RND (DRAIN) ×4 IMPLANT
DRAPE C-ARM 42X72 X-RAY (DRAPES) ×4 IMPLANT
DRAPE CHEST BREAST 15X10 FENES (DRAPES) ×4 IMPLANT
DRAPE UTILITY XL STRL (DRAPES) ×8 IMPLANT
DRAPE WARM FLUID 44X44 (DRAPE) IMPLANT
DRSG PAD ABDOMINAL 8X10 ST (GAUZE/BANDAGES/DRESSINGS) ×2 IMPLANT
ELECT CAUTERY BLADE 6.4 (BLADE) ×4 IMPLANT
ELECT COATED BLADE 2.86 ST (ELECTRODE) ×4 IMPLANT
ELECT REM PT RETURN 9FT ADLT (ELECTROSURGICAL) ×8
ELECTRODE REM PT RTRN 9FT ADLT (ELECTROSURGICAL) ×4 IMPLANT
EVACUATOR SILICONE 100CC (DRAIN) ×4 IMPLANT
GAUZE SPONGE 4X4 16PLY XRAY LF (GAUZE/BANDAGES/DRESSINGS) ×4 IMPLANT
GEL ULTRASOUND 20GR AQUASONIC (MISCELLANEOUS) IMPLANT
GLOVE BIO SURGEON STRL SZ 6 (GLOVE) ×6 IMPLANT
GLOVE BIO SURGEON STRL SZ8.5 (GLOVE) ×2 IMPLANT
GLOVE BIOGEL PI IND STRL 6.5 (GLOVE) ×2 IMPLANT
GLOVE BIOGEL PI IND STRL 7.5 (GLOVE) IMPLANT
GLOVE BIOGEL PI IND STRL 8.5 (GLOVE) IMPLANT
GLOVE BIOGEL PI INDICATOR 6.5 (GLOVE) ×4
GLOVE BIOGEL PI INDICATOR 7.5 (GLOVE) ×2
GLOVE BIOGEL PI INDICATOR 8.5 (GLOVE) ×2
GLOVE ECLIPSE 7.5 STRL STRAW (GLOVE) ×2 IMPLANT
GOWN STRL REUS W/ TWL LRG LVL3 (GOWN DISPOSABLE) ×4 IMPLANT
GOWN STRL REUS W/TWL 2XL LVL3 (GOWN DISPOSABLE) ×4 IMPLANT
GOWN STRL REUS W/TWL LRG LVL3 (GOWN DISPOSABLE) ×8
KIT BASIN OR (CUSTOM PROCEDURE TRAY) ×4 IMPLANT
KIT PORT POWER 8FR ISP CVUE (Catheter) ×2 IMPLANT
KIT ROOM TURNOVER OR (KITS) ×4 IMPLANT
LIGHT WAVEGUIDE WIDE FLAT (MISCELLANEOUS) ×2 IMPLANT
LIQUID BAND (GAUZE/BANDAGES/DRESSINGS) ×4 IMPLANT
MARKER SKIN DUAL TIP RULER LAB (MISCELLANEOUS) ×4 IMPLANT
NDL 18GX1X1/2 (RX/OR ONLY) (NEEDLE) ×2 IMPLANT
NDL HYPO 25GX1X1/2 BEV (NEEDLE) ×2 IMPLANT
NDL SPNL 22GX3.5 QUINCKE BK (NEEDLE) ×2 IMPLANT
NEEDLE 18GX1X1/2 (RX/OR ONLY) (NEEDLE) ×4 IMPLANT
NEEDLE HYPO 25GX1X1/2 BEV (NEEDLE) ×4 IMPLANT
NEEDLE SPNL 22GX3.5 QUINCKE BK (NEEDLE) ×4 IMPLANT
NS IRRIG 1000ML POUR BTL (IV SOLUTION) ×4 IMPLANT
PACK GENERAL/GYN (CUSTOM PROCEDURE TRAY) ×4 IMPLANT
PACK SURGICAL SETUP 50X90 (CUSTOM PROCEDURE TRAY) ×4 IMPLANT
PACK UNIVERSAL I (CUSTOM PROCEDURE TRAY) ×4 IMPLANT
PAD ARMBOARD 7.5X6 YLW CONV (MISCELLANEOUS) ×4 IMPLANT
PENCIL BUTTON HOLSTER BLD 10FT (ELECTRODE) ×4 IMPLANT
PLASMABLADE 3.0S (MISCELLANEOUS) ×4
SPECIMEN JAR X LARGE (MISCELLANEOUS) ×4 IMPLANT
SPONGE GAUZE 4X4 12PLY STER LF (GAUZE/BANDAGES/DRESSINGS) ×4 IMPLANT
SPONGE LAP 18X18 X RAY DECT (DISPOSABLE) ×2 IMPLANT
STAPLER VISISTAT 35W (STAPLE) ×4 IMPLANT
STOCKINETTE IMPERVIOUS 9X36 MD (GAUZE/BANDAGES/DRESSINGS) ×4 IMPLANT
STRIP CLOSURE SKIN 1/2X4 (GAUZE/BANDAGES/DRESSINGS) ×3 IMPLANT
SUT ETHILON 2 0 FS 18 (SUTURE) ×4 IMPLANT
SUT MON AB 4-0 PC3 18 (SUTURE) ×4 IMPLANT
SUT PROLENE 2 0 SH DA (SUTURE) ×8 IMPLANT
SUT SILK 2 0 (SUTURE) ×4
SUT SILK 2 0 FS (SUTURE) ×4 IMPLANT
SUT SILK 2-0 18XBRD TIE 12 (SUTURE) ×2 IMPLANT
SUT VIC AB 3-0 SH 27 (SUTURE) ×4
SUT VIC AB 3-0 SH 27X BRD (SUTURE) ×2 IMPLANT
SUT VIC AB 3-0 SH 8-18 (SUTURE) ×6 IMPLANT
SYR 20ML ECCENTRIC (SYRINGE) ×8 IMPLANT
SYR 50ML LL SCALE MARK (SYRINGE) ×4 IMPLANT
SYR 5ML LUER SLIP (SYRINGE) ×4 IMPLANT
SYR CONTROL 10ML LL (SYRINGE) ×4 IMPLANT
TOWEL OR 17X24 6PK STRL BLUE (TOWEL DISPOSABLE) ×4 IMPLANT
TOWEL OR 17X26 10 PK STRL BLUE (TOWEL DISPOSABLE) ×4 IMPLANT
TUBE CONNECTING 12'X1/4 (SUCTIONS) ×1
TUBE CONNECTING 12X1/4 (SUCTIONS) ×3 IMPLANT
YANKAUER SUCT BULB TIP NO VENT (SUCTIONS) IMPLANT

## 2015-07-01 NOTE — Interval H&P Note (Signed)
History and Physical Interval Note:  07/01/2015 9:22 AM  Catherine [name]  has presented today for surgery, with the diagnosis of LEFT BREAST CANCER  The various methods of treatment have been discussed with the patient and family. After consideration of risks, benefits and other options for treatment, the patient has consented to  Procedure(s): LEFT MASTECTOMY WITH SENTINEL LYMPH NODE BIOPSY AND POSSIBLE LYMPH NODE DISECTION (Left) INSERTION PORT-A-CATH (N/A) as a surgical intervention .  The patient's history has been reviewed, patient examined, no change in status, stable for surgery.  I have reviewed the patient's chart and labs.  Questions were answered to the patient's satisfaction.     Theus Espin

## 2015-07-01 NOTE — Op Note (Addendum)
Left Mastectomy with Sentinel Node Biopsy, Right subclavian port placement,   Indications: This patient presents with history of locally advanced left breast cancer with clinically suspicious axillary lymph node exam.  Pre-operative Diagnosis: left breast cancer, cT2N1M0  Post-operative Diagnosis: left breast cancer, same  Surgeon: [name]   Assistant:  Judyann Munson, RNFA  Anesthesia: General endotracheal anesthesia and Local anesthesia 0.25.% bupivacaine, with epinephrine  ASA Class: 2  Procedure Details  The patient was seen in the Holding Room. The risks, benefits, complications, treatment options, and expected outcomes were discussed with the patient. The possibilities of reaction to medication, pulmonary aspiration, bleeding, infection, the need for additional procedures, failure to diagnose a condition, and creating a complication requiring transfusion or operation were discussed with the patient. The patient concurred with the proposed plan, giving informed consent.  The site of surgery properly noted/marked. The patient was taken to Operating Room # 9, identified as Catherine [name] and the procedure verified as Left Mastectomy and Sentinel Node Biopsy, possible ALND, port placement. A Time Out was held and the above information confirmed.    Local anesthetic was administered over this   area at the angle of the clavicle.  The vein was accessed with 1 pass of the needle. There was good venous return and the wire passed easily with no ectopy.   Fluoroscopy was used to confirm that the wire was in the vena cava.      The patient was placed back level and the area for the pocket was anethetized   with local anesthetic.  A 3-cm transverse incision was made with a #15   blade.  Cautery was used to divide the subcutaneous tissues down to the   pectoralis muscle.  An Army[employer]Nav[employer] retractor was used to elevate the skin   while a pocket was created on top of the pectoralis fascia.  The  port   was placed into the pocket to confirm that it was of adequate size.  The   catheter was preattached to the port.  The port was then secured to the   pectoralis fascia with four 2-0 Prolene sutures.  These were clamped and   not tied down yet.    The catheter was tunneled through to the wire exit   site.  The catheter was placed along the wire to determine what length it should be to be in the SVC.  The catheter was cut at 17.5 cm.  The tunneler sheath and dilator were passed over the wire and the dilator and wire were removed.  The catheter was advanced through the tunneler sheath and the tunneler sheath was pulled away.  Care was taken to keep the catheter in the tunneler sheath as this occurred. This was advanced and the tunneler sheath was removed.  There was good venous   return and easy flush of the catheter.  The Prolene sutures were tied   down to the pectoral fascia.  The skin was reapproximated using 3-0   Vicryl interrupted deep dermal sutures.    Fluoroscopy was used to re-confirm good position of the catheter.  The skin   was then closed using 4-0 Monocryl in a subcuticular fashion.  The port was flushed with concentrated heparin flush as well.  The wounds were then cleaned, dried, and dressed with Dermabond.  The methylene blue was injected into the breast.  It was discovered that the technetium was not injected into the breast.  The borders of the breast were identified and marked.  The incisions of the breast was drawn out to make sure incision lines were equidistant in length.    The superior incision was made with the #10 blade.  The marcaine/saline mixture was infiltrated into the superior flap.  Mastectomy hooks were used to provide elevation of the skin edges, and the curved Mayo scissors were used to create the mastectomy flaps.  The dissection was taken to the fascia of the pectoralis major.  The penetrating vessels were clipped.  The superior flap was taken medially to  the lateral sternal border, superiorly to the inferior border of the clavicle.  The inferior flap was similarly created, inferiorly to the inframammary fold and laterally to the border of the latissimus.  The breast was taken off including the pectoralis fascia and the axillary tail marked.    Using palpation and visual inspection, 5 axillary sentinel nodes were identified.  Three level 2 axillary sentinel nodes were removed and submitted to pathology.  These were palpable.  Touch prep was negative.  Two additional SLN that were blue were located.  The findings are below.  The lymphovascular channels were clipped with metal clips.        The wound was irrigated.  One 19 Blake drain was placed laterally.   Hemostasis was achieved with cautery.  The wound was irrigated and closed with a 3-0 Vicryl deep dermal interrupted sutures and 4-0 Vicryl subcuticular closure in layers.    Sterile dressings were applied. At the end of the operation, all sponge, instrument, and needle counts were correct.  Findings: grossly clear surgical margins  Estimated Blood Loss:  less than 50 mL         Drains: One 19 Blake drain                Specimens: L breast and five axillary sentinel nodes         Complications:  None; patient tolerated the procedure well.         Disposition: PACU - hemodynamically stable.         Condition: stable

## 2015-07-01 NOTE — H&P (Signed)
Catherine [name] 06/11/2015 8:27 AM Location: [facility] Patient #: [id number] DOB: 05-15-1961 Undefined / Language: Catherine [name] / Race: Black or African American Female   History of Present Illness Catherine Klein MD; 06/11/2015 12:10 PM) The patient is a 54 year old female who presents with breast cancer. Pt is a 54 yo F referred by Dr. Amil [name] for consultation for a new diagnosis of a left breast cancer. The patient presented with a palpable mass on the left. She underwent diagnostic imaging that showed a 4 cm mass in the upper inner quadrant with nipple retraction. She also has a suspicious axillary lymph node. She was extraordinarily anxious and refused biopsy of the left axillary lymph node. She is very overwhelmed with her diagnosis and is refusing physical examination of her breast. She does not have any known family history of cancer.   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 ER/PR staining was positive. Her 2 was equivocal and has been reflexed to IHC. Ki 67 was 20%.   dx mammogram shows 4 cm irregular high density mass wtih spiculated margin and calcifications in the upper inner quadrant. Architectural distortion and nipple retraction is present.  She is accompanied by her adult son and her father. her parents are alive in their 80s. She smokes 1 ppd cigarettes and drinks beer occasionally. Menarche was at age 34. She is post menopausal. she has 2 children, with the first at age 52. She has not had a colonoscopy or bone density study. she has not had a Pap smear for quite a while.   History of bipolar reported, but patient not on medications. Son and father are unsure of this diagnosis.    Other Problems Catherine Slipper, RN; 06/11/2015 8:27 AM) Back Pain Breast Cancer Hypercholesterolemia Lump In Breast  Past Surgical History Catherine Slipper, RN; 06/11/2015 8:27 AM) Breast Biopsy Left. Cesarean Section -  1  Diagnostic Studies History Catherine Slipper, RN; 06/11/2015 8:27 AM) Colonoscopy never Mammogram within last year Pap Smear 1-5 years ago  Medication History Catherine Slipper, RN; 06/11/2015 8:28 AM) No Current Medications Medications Reconciled  Social History Catherine Slipper, RN; 06/11/2015 8:27 AM) Alcohol use Occasional alcohol use. Caffeine use Coffee. No drug use Tobacco use Current every day smoker.  Family History Catherine Slipper, RN; 06/11/2015 8:27 AM) Hypertension Mother. Seizure disorder Brother.  Pregnancy / Birth History Catherine Slipper, RN; 06/11/2015 8:27 AM) Age at menarche 30 years. Age of menopause 51-55 Contraceptive History Oral contraceptives. Gravida 5 Irregular periods Maternal age <15 Para 2    Review of Systems Catherine Slipper RN; 06/11/2015 8:27 AM) General Present- Night Sweats. Not Present- Appetite Loss, Chills, Fatigue, Fever, Weight Gain and Weight Loss. Skin Not Present- Change in Wart/Mole, Dryness, Hives, Jaundice, New Lesions, Non-Healing Wounds, Rash and Ulcer. HEENT Not Present- Earache, Hearing Loss, Hoarseness, Nose Bleed, Oral Ulcers, Ringing in the Ears, Seasonal Allergies, Sinus Pain, Sore Throat, Visual Disturbances, Wears glasses/contact lenses and Yellow Eyes. Respiratory Not Present- Bloody sputum, Chronic Cough, Difficulty Breathing, Snoring and Wheezing. Breast Present- Breast Mass, Breast Pain and Skin Changes. Not Present- Nipple Discharge. Cardiovascular Not Present- Chest Pain, Difficulty Breathing Lying Down, Leg Cramps, Palpitations, Rapid Heart Rate, Shortness of Breath and Swelling of Extremities. Gastrointestinal Present- Indigestion. Not Present- Abdominal Pain, Bloating, Bloody Stool, Change in Bowel Habits, Chronic diarrhea, Constipation, Difficulty Swallowing, Excessive gas, Gets full quickly at meals, Hemorrhoids, Nausea, Rectal Pain and Vomiting. Female Genitourinary Not Present- Frequency, Nocturia, Painful  Urination, Pelvic Pain  and Urgency. Musculoskeletal Present- Swelling of Extremities. Not Present- Back Pain, Joint Pain, Joint Stiffness, Muscle Pain and Muscle Weakness. Neurological Not Present- Decreased Memory, Fainting, Headaches, Numbness, Seizures, Tingling, Tremor, Trouble walking and Weakness. Psychiatric Present- Anxiety. Not Present- Bipolar, Change in Sleep Pattern, Depression, Fearful and Frequent crying. Endocrine Present- Hot flashes. Not Present- Cold Intolerance, Excessive Hunger, Hair Changes, Heat Intolerance and New Diabetes. Hematology Not Present- Easy Bruising, Excessive bleeding, Gland problems, HIV and Persistent Infections.  Vitals Catherine Klein MD; 06/11/2015 12:06 PM) 06/11/2015 12:05 PM Weight: 134.4 lb Height: 67in Body Surface Area: 1.71 m Body Mass Index: 21.05 kg/m  Temp.: 98.63F  Pulse: 84 (Regular)  Resp.: 18 (Unlabored)  BP: 136/78 (Sitting, Left Arm, Standard)       Physical Exam Catherine Klein MD; 06/11/2015 12:04 PM) General Mental Status-Alert. General Appearance-Consistent with stated age. Hydration-Well hydrated. Voice-Normal.  Head and Neck Note: normocephalic, atraumatic.   Eye Note: sclera anicteric, conjunctiva not injected.   Chest and Lung Exam Note: breathing comfortably.   Breast Note: not examined due to patient refusal.   Cardiovascular Note: rate regular   Neurologic Note: walks normally wtih no difficulty with gait.   Neuropsychiatric Note: patient very anxious. Not currently engaged in care.     Assessment & Plan Catherine Klein MD; 06/11/2015 12:09 PM) BREAST CANCER OF UPPER-INNER QUADRANT OF LEFT FEMALE BREAST (C50.212) Impression: Patient could have multiple options for care, but given the difficulty she has with follow up, transportation, and anxiety, we recommend proceeding directly with surgery for local control. We will plan left mastectomy with SLN bx and ALND if positive  as we expect. I will also place a port at the same time. I reviewed the risks with the patient and family. I discussed bleeding, infection, pain, possible need for additional surgery, possible pneumothorax.  She would need chemotherapy post operatively. Recommendation for radiation will be based on final pathology.  I advised the patient that I am happy to see her again before surgery if she has additional questions.  45 min spent in evaluation, examination, counseling, and coordination of care. >50% spent in counseling. Current Plans Pt Education - flb breast cancer surgery: discussed with patient and provided information. You are being scheduled for surgery - Our schedulers will call you.  You should hear from our office's scheduling department within 5 working days about the location, date, and time of surgery. We try to make accommodations for patient's preferences in scheduling surgery, but sometimes the OR schedule or the surgeon's schedule prevents [place] from making those accommodations.  If you have not heard from our office [phone]) in 5 working days, call the office and ask for your surgeon's nurse.  If you have other questions about your diagnosis, plan, or surgery, call the office and ask for your surgeon's nurse.    Signed by Catherine Klein, MD (06/11/2015 12:10 PM)

## 2015-07-01 NOTE — Anesthesia Procedure Notes (Addendum)
Anesthesia Regional Block:  Pectoralis block  Pre-Anesthetic Checklist: ,, timeout performed, Correct Patient, Correct Site, Correct Laterality, Correct Procedure, Correct Position, site marked, Risks and benefits discussed, Surgical consent,  Pre-op evaluation,  Post-op pain management  Laterality: Left  Prep: chloraprep       Needles:   Needle Type: Stimiplex     Needle Length: 9cm 9 cm     Additional Needles:  Procedures: ultrasound guided (picture in chart) Pectoralis block Narrative:  Injection made incrementally with aspirations every 5 mL.  Performed by: Personally  Anesthesiologist: GERMEROTH, JOHN  Additional Notes: Patient tolerated well. Good fascial spread noted.   Procedure Name: Intubation Date/Time: 07/01/2015 10:17 AM Performed by: O'LAUGHLIN, KAREN H Pre-anesthesia Checklist: Patient identified, Timeout performed, Emergency Drugs available, Suction available and Patient being monitored Patient Re-evaluated:Patient Re-evaluated prior to inductionOxygen Delivery Method: Circle system utilized Preoxygenation: Pre-oxygenation with 100% oxygen Intubation Type: IV induction Ventilation: Mask ventilation without difficulty Laryngoscope Size: Mac and 3 Grade View: Grade I Tube type: Oral Tube size: 7.0 mm Number of attempts: 1 Airway Equipment and Method: Stylet and LTA kit utilized Placement Confirmation: ETT inserted through vocal cords under direct vision,  positive ETCO2 and breath sounds checked- equal and bilateral Secured at: 22 cm Tube secured with: Tape Dental Injury: Teeth and Oropharynx as per pre-operative assessment      

## 2015-07-01 NOTE — Transfer of Care (Signed)
Immediate Anesthesia Transfer of Care Note  Patient: Catherine [name]  Procedure(s) Performed: Procedure(s): LEFT MASTECTOMY WITH SENTINEL LYMPH NODE BIOPSY (Left) INSERTION PORT-A-CATH (N/A)  Patient Location: PACU  Anesthesia Type:General  Level of Consciousness: awake and alert   Airway & Oxygen Therapy: Patient Spontanous Breathing and Patient connected to nasal cannula oxygen  Post-op Assessment: Report given to RN and Post -op Vital signs reviewed and stable  Post vital signs: Reviewed and stable  Last Vitals:  Filed Vitals:   07/01/15 1000 07/01/15 1300  BP:    Pulse: 82   Temp:  36.5 C  Resp: 14     Complications: No apparent anesthesia complications

## 2015-07-01 NOTE — Discharge Instructions (Signed)
[facility] °[phone] ° °MASTECTOMY: POST OP INSTRUCTIONS ° °Always review your discharge instruction sheet given to you by the facility where your surgery was performed. °IF YOU HAVE DISABILITY OR FAMILY LEAVE FORMS, YOU MUST BRING THEM TO THE OFFICE FOR PROCESSING.   °DO NOT GIVE THEM TO YOUR DOCTOR. °A prescription for pain medication may be given to you upon discharge.  Take your pain medication as prescribed, if needed.  If narcotic pain medicine is not needed, then you may take acetaminophen (Tylenol) or ibuprofen (Advil) as needed. °1. Take your usually prescribed medications unless otherwise directed. °2. If you need a refill on your pain medication, please contact your pharmacy.  They will contact our office to request authorization.  Prescriptions will not be filled after 5pm or on week-ends. °3. You should follow a light diet the first few days after arrival home, such as soup and crackers, etc.  Resume your normal diet the day after surgery. °4. Most patients will experience some swelling and bruising on the chest and underarm.  Ice packs will help.  Swelling and bruising can take several days to resolve.  °5. It is common to experience some constipation if taking pain medication after surgery.  Increasing fluid intake and taking a stool softener (such as Colace) will usually help or prevent this problem from occurring.  A mild laxative (Milk of Magnesia or Miralax) should be taken according to package instructions if there are no bowel movements after 48 hours. °6. Unless discharge instructions indicate otherwise, leave your bandage dry and in place until your next appointment in 3-5 days.  You may take a limited sponge bath.  No tube baths or showers until the drains are removed.  You may have steri-strips (small skin tapes) in place directly over the incision.  These strips should be left on the skin for 7-10 days.  If your surgeon used skin glue on the incision, you may  shower in 24 hours.  The glue will flake off over the next 2-3 weeks.  Any sutures or staples will be removed at the office during your follow-up visit. °7. DRAINS:  If you have drains in place, it is important to keep a list of the amount of drainage produced each day in your drains.  Before leaving the hospital, you should be instructed on drain care.  Call our office if you have any questions about your drains. °8. ACTIVITIES:  You may resume regular (light) daily activities beginning the next day--such as daily self-care, walking, climbing stairs--gradually increasing activities as tolerated.  You may have sexual intercourse when it is comfortable.  Refrain from any heavy lifting or straining until approved by your doctor. °a. You may drive when you are no longer taking prescription pain medication, you can comfortably wear a seatbelt, and you can safely maneuver your car and apply brakes. °b. RETURN TO WORK:  __________________________________________________________ °9. You should see your doctor in the office for a follow-up appointment approximately 3-5 days after your surgery.  Your doctor’s nurse will typically make your follow-up appointment when she calls you with your pathology report.  Expect your pathology report 2-3 business days after your surgery.  You may call to check if you do not hear from us after three days.   °10. OTHER INSTRUCTIONS: ______________________________________________________________________________________________ ____________________________________________________________________________________________ °WHEN TO CALL YOUR DOCTOR: °1. Fever over 101.0 °2. Nausea and/or vomiting °3. Extreme swelling or bruising °4. Continued bleeding from incision. °5. Increased pain, redness, or drainage from the incision. °  The clinic staff is available to answer your questions during regular business hours.  Please don’t hesitate to call and ask to speak to one of the nurses for clinical  concerns.  If you have a medical emergency, go to the nearest emergency room or call 911.  A surgeon from [facility] is always on call at the hospital. °[address], [address], [place], [place]  [address] ? [address] [address], [place], [place]   [address] °[phone] ? [phone] ? FAX [phone] °Web site: [email or web address] °

## 2015-07-01 NOTE — Anesthesia Preprocedure Evaluation (Addendum)
Anesthesia Evaluation  Patient identified by MRN, date of birth, ID band Patient awake    Reviewed: Allergy & Precautions, NPO status , Patient's Chart, lab work & pertinent test results  Airway Mallampati: II  TM Distance: >3 FB Neck ROM: Full    Dental no notable dental hx.    Pulmonary Current Smoker,    Pulmonary exam normal breath sounds clear to auscultation       Cardiovascular negative cardio ROS Normal cardiovascular exam Rhythm:Regular Rate:Normal     Neuro/Psych PSYCHIATRIC DISORDERS Anxiety Depression Bipolar Disorder negative neurological ROS     GI/Hepatic negative GI ROS, Neg liver ROS,   Endo/Other  negative endocrine ROS  Renal/GU negative Renal ROS     Musculoskeletal negative musculoskeletal ROS (+)   Abdominal   Peds  Hematology negative hematology ROS (+)   Anesthesia Other Findings   Reproductive/Obstetrics negative OB ROS                             Anesthesia Physical Anesthesia Plan  ASA: II  Anesthesia Plan: General and Regional   Post-op Pain Management:    Induction: Intravenous  Airway Management Planned: Oral ETT  Additional Equipment: None  Intra-op Plan:   Post-operative Plan: Extubation in OR  Informed Consent: I have reviewed the patients History and Physical, chart, labs and discussed the procedure including the risks, benefits and alternatives for the proposed anesthesia with the patient or authorized representative who has indicated his/her understanding and acceptance.   Dental advisory given  Plan Discussed with: CRNA  Anesthesia Plan Comments:        Anesthesia Quick Evaluation

## 2015-07-01 NOTE — Anesthesia Postprocedure Evaluation (Signed)
Anesthesia Post Note  Patient: Catherine [name]  Procedure(s) Performed: Procedure(s) (LRB): LEFT MASTECTOMY WITH SENTINEL LYMPH NODE BIOPSY (Left) INSERTION PORT-A-CATH (N/A)  Patient location during evaluation: PACU Anesthesia Type: General Level of consciousness: awake Pain management: pain level controlled Vital Signs Assessment: post-procedure vital signs reviewed and stable Respiratory status: spontaneous breathing Cardiovascular status: stable Anesthetic complications: no    Last Vitals:  Filed Vitals:   07/01/15 1330 07/01/15 1400  BP: 132/88 135/85  Pulse: 80 71  Temp:    Resp: 18 16    Last Pain:  Filed Vitals:   07/01/15 1407  PainSc: Asleep                 EDWARDS,Andren Bethea

## 2015-07-02 ENCOUNTER — Ambulatory Visit (HOSPITAL_COMMUNITY): Payer: Medicaid Other | Admitting: Certified Registered"

## 2015-07-02 ENCOUNTER — Other Ambulatory Visit: Payer: Self-pay | Admitting: *Deleted

## 2015-07-02 ENCOUNTER — Encounter (HOSPITAL_COMMUNITY)
Admission: RE | Disposition: A | Discharge status: Home / Self Care | Payer: Self-pay | Source: Ambulatory Visit | Attending: General Surgery

## 2015-07-02 HISTORY — PX: EVACUATION BREAST HEMATOMA: SHX1537

## 2015-07-02 LAB — POCT I-STAT 4, (NA,K, GLUC, HGB,HCT)
GLUCOSE: 130 mg/dL — AB (ref 65–99)
GLUCOSE: 134 mg/dL — AB (ref 65–99)
HCT: 10 % — ABNORMAL LOW (ref 36.0–46.0)
HEMATOCRIT: 11 % — AB (ref 36.0–46.0)
HEMOGLOBIN: 3.4 g/dL — AB (ref 12.0–15.0)
Hemoglobin: 3.7 g/dL — CL (ref 12.0–15.0)
Potassium: 2.9 mmol/L — ABNORMAL LOW (ref 3.5–5.1)
Potassium: 3.1 mmol/L — ABNORMAL LOW (ref 3.5–5.1)
SODIUM: 143 mmol/L (ref 135–145)
Sodium: 143 mmol/L (ref 135–145)

## 2015-07-02 LAB — BASIC METABOLIC PANEL
Anion gap: 7 (ref 5–15)
BUN: 10 mg/dL (ref 6–20)
CHLORIDE: 109 mmol/L (ref 101–111)
CO2: 24 mmol/L (ref 22–32)
CREATININE: 0.91 mg/dL (ref 0.44–1.00)
Calcium: 9.5 mg/dL (ref 8.9–10.3)
GFR calc Af Amer: >60 mL/min (ref >60)
GLUCOSE: 137 mg/dL — AB (ref 65–99)
POTASSIUM: 4.1 mmol/L (ref 3.5–5.1)
Sodium: 140 mmol/L (ref 135–145)

## 2015-07-02 LAB — PREPARE RBC (CROSSMATCH)

## 2015-07-02 LAB — CBC WITH DIFFERENTIAL/PLATELET
BASOS ABS: 0 10*3/uL (ref 0.0–0.1)
BASOS PCT: 0 %
EOS ABS: 0 10*3/uL (ref 0.0–0.7)
Eosinophils Relative: 0 %
HEMATOCRIT: 14.1 % — AB (ref 36.0–46.0)
HEMOGLOBIN: 4.9 g/dL — AB (ref 12.0–15.0)
Lymphocytes Relative: 36 %
Lymphs Abs: 2.4 10*3/uL (ref 0.7–4.0)
MCH: 30.2 pg (ref 26.0–34.0)
MCHC: 34.8 g/dL (ref 30.0–36.0)
MCV: 87 fL (ref 78.0–100.0)
MONOS PCT: 7 %
Monocytes Absolute: 0.5 10*3/uL (ref 0.1–1.0)
NEUTROS ABS: 3.8 10*3/uL (ref 1.7–7.7)
NEUTROS PCT: 57 %
Platelets: 88 10*3/uL — ABNORMAL LOW (ref 150–400)
RBC: 1.62 MIL/uL — AB (ref 3.87–5.11)
RDW: 15.4 % (ref 11.5–15.5)
WBC: 6.7 10*3/uL (ref 4.0–10.5)

## 2015-07-02 LAB — CBC
HCT: 31.7 % — ABNORMAL LOW (ref 36.0–46.0)
HCT: 27.7 % — ABNORMAL LOW (ref 36.0–46.0)
Hemoglobin: 10.8 g/dL — ABNORMAL LOW (ref 12.0–15.0)
Hemoglobin: 9.3 g/dL — ABNORMAL LOW (ref 12.0–15.0)
MCH: 30.3 pg (ref 26.0–34.0)
MCH: 29.8 pg (ref 26.0–34.0)
MCHC: 34.1 g/dL (ref 30.0–36.0)
MCHC: 33.6 g/dL (ref 30.0–36.0)
MCV: 88.8 fL (ref 78.0–100.0)
MCV: 88.8 fL (ref 78.0–100.0)
PLATELETS: 198 10*3/uL (ref 150–400)
PLATELETS: 119 10*3/uL — AB (ref 150–400)
RBC: 3.57 MIL/uL — AB (ref 3.87–5.11)
RBC: 3.12 MIL/uL — AB (ref 3.87–5.11)
RDW: 15.3 % (ref 11.5–15.5)
RDW: 14.3 % (ref 11.5–15.5)
WBC: 15.7 10*3/uL — ABNORMAL HIGH (ref 4.0–10.5)
WBC: 15.6 10*3/uL — AB (ref 4.0–10.5)

## 2015-07-02 LAB — PROTIME-INR
INR: 1.47 (ref 0.00–1.49)
PROTHROMBIN TIME: 17.9 s — AB (ref 11.6–15.2)

## 2015-07-02 LAB — APTT: APTT: 31 s (ref 24–37)

## 2015-07-02 LAB — HEMOGLOBIN AND HEMATOCRIT, BLOOD
HEMATOCRIT: 26.6 % — AB (ref 36.0–46.0)
HEMOGLOBIN: 9 g/dL — AB (ref 12.0–15.0)

## 2015-07-02 LAB — ABO/RH: ABO/RH(D): B POS

## 2015-07-02 LAB — GLUCOSE, CAPILLARY: GLUCOSE-CAPILLARY: 184 mg/dL — AB (ref 65–99)

## 2015-07-02 SURGERY — EVACUATION, HEMATOMA, BREAST
Anesthesia: General | Site: Chest | Laterality: Left

## 2015-07-02 MED ORDER — ROCURONIUM BROMIDE 100 MG/10ML IV SOLN
INTRAVENOUS | Status: DC | PRN
  Administered 2015-07-02: 20 mg via INTRAVENOUS
  Administered 2015-07-02: 10 mg via INTRAVENOUS

## 2015-07-02 MED ORDER — SUCCINYLCHOLINE CHLORIDE 20 MG/ML IJ SOLN
INTRAMUSCULAR | Status: DC | PRN
  Administered 2015-07-02: 60 mg via INTRAVENOUS

## 2015-07-02 MED ORDER — SODIUM CHLORIDE 0.9 % IV SOLN: Freq: Once | INTRAVENOUS | Status: DC

## 2015-07-02 MED ORDER — ACETAMINOPHEN 160 MG/5ML PO SOLN: 325.0000 mg | ORAL | Status: DC | PRN

## 2015-07-02 MED ORDER — ACETAMINOPHEN 325 MG PO TABS: 325.0000 mg | ORAL_TABLET | ORAL | Status: DC | PRN

## 2015-07-02 MED ORDER — HYDROMORPHONE HCL 1 MG/ML IJ SOLN: 0.2500 mg | INTRAMUSCULAR | Status: DC | PRN

## 2015-07-02 MED ORDER — 0.9 % SODIUM CHLORIDE (POUR BTL) OPTIME
TOPICAL | Status: DC | PRN
  Administered 2015-07-02: 1000 mL

## 2015-07-02 MED ORDER — PHENYLEPHRINE 40 MCG/ML (10ML) SYRINGE FOR IV PUSH (FOR BLOOD PRESSURE SUPPORT)
PREFILLED_SYRINGE | INTRAVENOUS | Status: AC
  Filled 2015-07-02: qty 10

## 2015-07-02 MED ORDER — OXYCODONE HCL 5 MG/5ML PO SOLN: 5.0000 mg | Freq: Once | ORAL | Status: DC | PRN

## 2015-07-02 MED ORDER — SODIUM CHLORIDE 0.9 % IV SOLN
INTRAVENOUS | Status: DC
  Administered 2015-07-02 – 2015-07-03 (×4): via INTRAVENOUS

## 2015-07-02 MED ORDER — FENTANYL CITRATE (PF) 250 MCG/5ML IJ SOLN
INTRAMUSCULAR | Status: AC
  Filled 2015-07-02: qty 5

## 2015-07-02 MED ORDER — PROPOFOL 10 MG/ML IV BOLUS
INTRAVENOUS | Status: AC
  Filled 2015-07-02: qty 20

## 2015-07-02 MED ORDER — SUGAMMADEX SODIUM 200 MG/2ML IV SOLN
INTRAVENOUS | Status: DC | PRN
  Administered 2015-07-02: 120.4 mg via INTRAVENOUS

## 2015-07-02 MED ORDER — OXYCODONE HCL 5 MG PO TABS: 5.0000 mg | ORAL_TABLET | Freq: Once | ORAL | Status: DC | PRN

## 2015-07-02 MED ORDER — MIDAZOLAM HCL 2 MG/2ML IJ SOLN
INTRAMUSCULAR | Status: AC
  Filled 2015-07-02: qty 2

## 2015-07-02 MED ORDER — MORPHINE SULFATE 15 MG PO TABS: 15.0000 mg | ORAL_TABLET | Freq: Four times a day (QID) | ORAL | Status: DC | PRN

## 2015-07-02 MED ORDER — FENTANYL CITRATE (PF) 100 MCG/2ML IJ SOLN
INTRAMUSCULAR | Status: DC | PRN
  Administered 2015-07-02: 100 ug via INTRAVENOUS
  Administered 2015-07-02: 50 ug via INTRAVENOUS

## 2015-07-02 MED ORDER — LACTATED RINGERS IV SOLN
INTRAVENOUS | Status: DC | PRN
  Administered 2015-07-02: 18:00:00 via INTRAVENOUS

## 2015-07-02 MED ORDER — POVIDONE-IODINE 10 % EX OINT
TOPICAL_OINTMENT | CUTANEOUS | Status: AC
  Filled 2015-07-02: qty 28.35

## 2015-07-02 MED ORDER — PHENYLEPHRINE HCL 10 MG/ML IJ SOLN
INTRAMUSCULAR | Status: DC | PRN
  Administered 2015-07-02 (×2): 80 ug via INTRAVENOUS
  Administered 2015-07-02 (×2): 120 ug via INTRAVENOUS

## 2015-07-02 MED ORDER — SODIUM CHLORIDE 0.9 % IV SOLN
Freq: Once | INTRAVENOUS | Status: AC
  Administered 2015-07-02: 20:00:00 via INTRAVENOUS

## 2015-07-02 MED ORDER — PROPOFOL 10 MG/ML IV BOLUS
INTRAVENOUS | Status: DC | PRN
  Administered 2015-07-02: 140 mg via INTRAVENOUS

## 2015-07-02 MED ORDER — SUGAMMADEX SODIUM 200 MG/2ML IV SOLN
INTRAVENOUS | Status: AC
  Filled 2015-07-02: qty 2

## 2015-07-02 MED ORDER — MIDAZOLAM HCL 5 MG/5ML IJ SOLN
INTRAMUSCULAR | Status: DC | PRN
  Administered 2015-07-02: 2 mg via INTRAVENOUS

## 2015-07-02 MED ORDER — ALBUMIN HUMAN 5 % IV SOLN
INTRAVENOUS | Status: DC | PRN
  Administered 2015-07-02 (×2): via INTRAVENOUS

## 2015-07-02 MED ORDER — ONDANSETRON HCL 4 MG/2ML IJ SOLN
INTRAMUSCULAR | Status: DC | PRN
  Administered 2015-07-02: 4 mg via INTRAVENOUS

## 2015-07-02 MED ORDER — KETOROLAC TROMETHAMINE 10 MG PO TABS
10.0000 mg | ORAL_TABLET | Freq: Four times a day (QID) | ORAL | Status: DC | PRN
  Administered 2015-07-02: 10 mg via ORAL
  Filled 2015-07-02 (×3): qty 1

## 2015-07-02 MED ORDER — CEFAZOLIN SODIUM-DEXTROSE 2-3 GM-% IV SOLR
INTRAVENOUS | Status: DC | PRN
  Administered 2015-07-02: 2 g via INTRAVENOUS

## 2015-07-02 MED ORDER — SUCCINYLCHOLINE CHLORIDE 20 MG/ML IJ SOLN
INTRAMUSCULAR | Status: AC
  Filled 2015-07-02: qty 1

## 2015-07-02 MED ORDER — SODIUM CHLORIDE 0.9 % IV SOLN
INTRAVENOUS | Status: DC | PRN
  Administered 2015-07-02: 18:00:00 via INTRAVENOUS

## 2015-07-02 SURGICAL SUPPLY — 74 items
APL SKNCLS STERI-STRIP NONHPOA (GAUZE/BANDAGES/DRESSINGS)
APPLIER CLIP 9.375 MED OPEN (MISCELLANEOUS)
APR CLP MED 9.3 20 MLT OPN (MISCELLANEOUS)
BENZOIN TINCTURE PRP APPL 2/3 (GAUZE/BANDAGES/DRESSINGS) ×1 IMPLANT
BINDER BREAST LRG (GAUZE/BANDAGES/DRESSINGS) IMPLANT
BINDER BREAST MEDIUM (GAUZE/BANDAGES/DRESSINGS) ×3 IMPLANT
BINDER BREAST XLRG (GAUZE/BANDAGES/DRESSINGS) IMPLANT
CANISTER SUCTION 2500CC (MISCELLANEOUS) ×4 IMPLANT
CHLORAPREP W/TINT 26ML (MISCELLANEOUS) ×2 IMPLANT
CLEANER TIP ELECTROSURG 2X2 (MISCELLANEOUS) ×1 IMPLANT
CLIP APPLIE 9.375 MED OPEN (MISCELLANEOUS) IMPLANT
CLOSURE WOUND 1/4X4 (GAUZE/BANDAGES/DRESSINGS)
CONT SPEC 4OZ CLIKSEAL STRL BL (MISCELLANEOUS) ×1 IMPLANT
COVER SURGICAL LIGHT HANDLE (MISCELLANEOUS) ×8 IMPLANT
DECANTER SPIKE VIAL GLASS SM (MISCELLANEOUS) ×1 IMPLANT
DEVICE DUBIN SPECIMEN MAMMOGRA (MISCELLANEOUS) IMPLANT
DRAIN CHANNEL 19F RND (DRAIN) ×4 IMPLANT
DRAPE LAPAROSCOPIC ABDOMINAL (DRAPES) ×1 IMPLANT
DRAPE PED LAPAROTOMY (DRAPES) ×1 IMPLANT
DRAPE PROXIMA HALF (DRAPES) ×4 IMPLANT
DRAPE UTILITY XL STRL (DRAPES) ×4 IMPLANT
DRSG OPSITE 4X5.5 SM (GAUZE/BANDAGES/DRESSINGS) IMPLANT
DRSG PAD ABDOMINAL 8X10 ST (GAUZE/BANDAGES/DRESSINGS) ×3 IMPLANT
ELECT CAUTERY BLADE 6.4 (BLADE) ×1 IMPLANT
ELECT REM PT RETURN 9FT ADLT (ELECTROSURGICAL) ×8
ELECTRODE REM PT RTRN 9FT ADLT (ELECTROSURGICAL) ×4 IMPLANT
EVACUATOR SILICONE 100CC (DRAIN) ×4 IMPLANT
GAUZE SPONGE 4X4 12PLY STRL (GAUZE/BANDAGES/DRESSINGS) ×4 IMPLANT
GAUZE SPONGE 4X4 16PLY XRAY LF (GAUZE/BANDAGES/DRESSINGS) ×1 IMPLANT
GAUZE XEROFORM 5X9 LF (GAUZE/BANDAGES/DRESSINGS) ×3 IMPLANT
GLOVE BIOGEL PI IND STRL 8 (GLOVE) ×4 IMPLANT
GLOVE BIOGEL PI INDICATOR 8 (GLOVE) ×4
GLOVE ECLIPSE 7.5 STRL STRAW (GLOVE) ×8 IMPLANT
GOWN STRL REUS W/ TWL LRG LVL3 (GOWN DISPOSABLE) ×7 IMPLANT
GOWN STRL REUS W/TWL LRG LVL3 (GOWN DISPOSABLE) ×8
HEMOSTAT SNOW SURGICEL 2X4 (HEMOSTASIS) ×6 IMPLANT
KIT BASIN OR (CUSTOM PROCEDURE TRAY) ×8 IMPLANT
KIT ROOM TURNOVER OR (KITS) ×8 IMPLANT
LIQUID BAND (GAUZE/BANDAGES/DRESSINGS) ×1 IMPLANT
MARGIN MAP 10MM (MISCELLANEOUS) IMPLANT
MARKER SKIN DUAL TIP RULER LAB (MISCELLANEOUS) ×1 IMPLANT
NDL 18GX1X1/2 (RX/OR ONLY) (NEEDLE) ×1 IMPLANT
NDL HYPO 25GX1X1/2 BEV (NEEDLE) ×1 IMPLANT
NEEDLE 18GX1X1/2 (RX/OR ONLY) (NEEDLE) IMPLANT
NEEDLE 22X1 1/2 (OR ONLY) (NEEDLE) IMPLANT
NEEDLE 27GAX1X1/2 (NEEDLE) IMPLANT
NEEDLE HYPO 25GX1X1/2 BEV (NEEDLE) IMPLANT
NS IRRIG 1000ML POUR BTL (IV SOLUTION) ×5 IMPLANT
PACK GENERAL/GYN (CUSTOM PROCEDURE TRAY) ×4 IMPLANT
PACK SURGICAL SETUP 50X90 (CUSTOM PROCEDURE TRAY) ×1 IMPLANT
PAD ARMBOARD 7.5X6 YLW CONV (MISCELLANEOUS) ×16 IMPLANT
PENCIL BUTTON HOLSTER BLD 10FT (ELECTRODE) ×1 IMPLANT
SPECIMEN JAR LARGE (MISCELLANEOUS) ×1 IMPLANT
SPONGE GAUZE 4X4 12PLY STER LF (GAUZE/BANDAGES/DRESSINGS) ×3 IMPLANT
STAPLER VISISTAT 35W (STAPLE) ×4 IMPLANT
STRIP CLOSURE SKIN 1/4X4 (GAUZE/BANDAGES/DRESSINGS) ×1 IMPLANT
SUT ETHILON 3 0 FSL (SUTURE) ×4 IMPLANT
SUT MON AB 4-0 PC3 18 (SUTURE) ×1 IMPLANT
SUT VIC AB 3-0 54X BRD REEL (SUTURE) ×1 IMPLANT
SUT VIC AB 3-0 BRD 54 (SUTURE)
SUT VIC AB 3-0 SH 27 (SUTURE) ×4
SUT VIC AB 3-0 SH 27XBRD (SUTURE) ×2 IMPLANT
SUT VIC AB 3-0 SH 8-18 (SUTURE) ×1 IMPLANT
SUT VIC AB 4-0 SH 27 (SUTURE)
SUT VIC AB 4-0 SH 27XBRD (SUTURE) ×1 IMPLANT
SUT VIC AB 5-0 P-3 18XBRD (SUTURE) ×1 IMPLANT
SUT VIC AB 5-0 P3 18 (SUTURE)
SYR CONTROL 10ML LL (SYRINGE) ×1 IMPLANT
TOWEL OR 17X24 6PK STRL BLUE (TOWEL DISPOSABLE) ×5 IMPLANT
TOWEL OR 17X26 10 PK STRL BLUE (TOWEL DISPOSABLE) ×8 IMPLANT
TUBE CONNECTING 12'X1/4 (SUCTIONS)
TUBE CONNECTING 12X1/4 (SUCTIONS) IMPLANT
WATER STERILE IRR 1000ML POUR (IV SOLUTION) ×1 IMPLANT
YANKAUER SUCT BULB TIP NO VENT (SUCTIONS) IMPLANT

## 2015-07-02 NOTE — Progress Notes (Signed)
Initiated first unit of FFP at 2050. Verified with 2 RNs at bedside. Will rapidly infuse both units of FFP due to low hgb. Patient stable, tolerating infusion well

## 2015-07-02 NOTE — Significant Event (Signed)
Rapid Response Event Note  Overview: Time Called: [place] Time: 1645 Event Type: Other (Comment) (bleeding from wound site)  Initial Focused Assessment: Patient with large hematoma at wound site, and bleeding from wound and into JP drains. Patient alert and oriented.  She is very dramatic with her movements and behavior. BP 137/102  ST 130  RR 20  O2 sat 98% on RA.  Interventions: IV team placed PIV, patient refused port accessed. Dr Brantley Stage and Dr Hulen Skains at bedside to speak with patient regarding surgery. HR decreased to 110 when patient resting quietly. Type and Cross ordered for blood transfusion, awaiting lab draw. Dr Hulen Skains spoke with patient son as well. Patient transported to OR   Event Summary: Name of Physician Notified: Dr Hulen Skains at [facility]    at          [place], Kelli Churn

## 2015-07-02 NOTE — Progress Notes (Signed)
Patient fell in room and called me. She said she was getting up to turn down the heat and she thinks she passed out.  Patient's call bell was beside her in the bed and she had been instructed on how to call for help.  Rapid response called. Dressing applied to JP site and incision site. New breast binder placed. JP drain stripped and emptied 350 cc. Dr. Brantley Stage notified and orders received.

## 2015-07-02 NOTE — Op Note (Signed)
OPERATIVE REPORT  DATE OF OPERATION:  07/02/2015  PATIENT:  Catherine [name]  54 y.o. female  PRE-OPERATIVE DIAGNOSIS:  Hematoma Left Breast  POST-OPERATIVE DIAGNOSIS:  Hematoma Left Breast  PROCEDURE:  Procedure(s): EVACUATION HEMATOMA LEFT Mastectomy Site and control of bleeding  SURGEON:  Surgeon(s): Judeth Horn, MD  ASSISTANT: None  ANESTHESIA:   general  EBL: <50 ml, but the patient had close to two units of clotted blood in the wound when we opened  BLOOD ADMINISTERED: 700 CC PRBC  DRAINS: (One) Blake drain(s) in the left axilla and breast flap   SPECIMEN:  No Specimen  COUNTS CORRECT:  YES  PROCEDURE DETAILS: The patient was taken to the operating room and placed on the table in supine position. After an adequate general endotracheal anesthetic was administered her left arm was placed on home towards writing maternal body the left chest wall was prepped and draped in usual sterile manner.  The [place] drain which was in place was removed prior to prepping the patient. A proper timeout was performed identifying the patient and procedure to be performed.  Her skin is being closed using a running subcuticular stitch of Monocryl. This suture was cut on his reopen the wound widely. Approximately 2 units of clotted blood within the mastectomy flaps superiorly and inferiorly and also into the left axilla.  We evacuated the hematoma then irrigated and saw the most bleeding was coming from the lateral aspect of her pectoralis muscle on the left side in the superior portion of the chest wall on the left side near the axilla. This was cauterized, a 3-0 Vicryl suture ligature was placed, and ultimately a piece of Surgicel snow was placed on the raw surface to help control bleeding. The second most common site of bleeding was on the medial aspect on the anterior chest wall and the pectoralis muscle itself. This was cauterized and Surgicel snow was placed serosal control bleeding.  A 19  mm Blake drain was replaced through the old drain site and secured in place with a 3-0 nylon suture. Was placed up towards the axilla then wrapped around the superior flap medially. All counts were correct including needles sponges and instruments. The skin was closed using stainless steel staples. A sterile dressing was applied.  PATIENT DISPOSITION:  PACU - guarded condition.   Catherine [name] 11/23/20167:20 PM

## 2015-07-02 NOTE — Progress Notes (Signed)
Dr. Hulen Skains called and Hgb results given, Hgb 9.3 at this time, per Dr. Hulen Skains, no need for further blood transfusions at this time, FFP completed in PACU, VSS, will continue to monitor.

## 2015-07-02 NOTE — Transfer of Care (Signed)
Immediate Anesthesia Transfer of Care Note  Patient: Catherine [name]  Procedure(s) Performed: Procedure(s): EVACUATION HEMATOMA LEFT Mastectomy Site (Left)  Patient Location: PACU  Anesthesia Type:General  Level of Consciousness: sedated and patient cooperative  Airway & Oxygen Therapy: Patient Spontanous Breathing and Patient connected to face mask oxygen  Post-op Assessment: Report given to RN and Post -op Vital signs reviewed and stable  Post vital signs: Reviewed and stable  Last Vitals:  Filed Vitals:   07/02/15 1718 07/02/15 1944  BP: 137/102 156/77  Pulse: 130   Temp:  36.4 C  Resp: 20 19    Complications: No apparent anesthesia complications

## 2015-07-02 NOTE — Progress Notes (Signed)
Called to see patient after  A fall in her room and to evaluate left mastectomy wound and hematoma.  Apparently she got up and fell.  She has been abusive to staff all day and upset.  She was quite appropriate and pleasant upon evaluation.  Her vitals were reviewed.  She has a moderate size hematoma left mastectomy wound noted.  Somme blood at  The incision noted but no active bleeding.  Vitals reviewed.  Hgb dropped from 10.8 to 9.0 tonight.  I recommended evacuation of the hematoma to the patient but she was unsure she wanted to do that.  I explained it would not heal well and she would continue to bleed.  She voiced understanding and said she would think about it.

## 2015-07-02 NOTE — Anesthesia Preprocedure Evaluation (Signed)
Anesthesia Evaluation  Patient identified by MRN, date of birth, ID band Patient awake    Reviewed: Unable to perform ROS - Chart review onlyPreop documentation limited or incomplete due to emergent nature of procedure.  Airway Mallampati: II  TM Distance: >3 FB Neck ROM: Full    Dental  (+) Teeth Intact   Pulmonary Current Smoker,    breath sounds clear to auscultation       Cardiovascular negative cardio ROS   Rate:Tachycardia     Neuro/Psych PSYCHIATRIC DISORDERS Anxiety Depression Bipolar Disorder negative neurological ROS     GI/Hepatic negative GI ROS, Neg liver ROS,   Endo/Other  negative endocrine ROS  Renal/GU negative Renal ROS     Musculoskeletal negative musculoskeletal ROS (+)   Abdominal   Peds  Hematology negative hematology ROS (+)   Anesthesia Other Findings   Reproductive/Obstetrics                             Anesthesia Physical Anesthesia Plan  ASA: III and emergent  Anesthesia Plan: General   Post-op Pain Management:    Induction: Intravenous, Rapid sequence and Cricoid pressure planned  Airway Management Planned: Oral ETT  Additional Equipment: Arterial line  Intra-op Plan:   Post-operative Plan: Extubation in OR  Informed Consent: I have reviewed the patients History and Physical, chart, labs and discussed the procedure including the risks, benefits and alternatives for the proposed anesthesia with the patient or authorized representative who has indicated his/her understanding and acceptance.   Dental advisory given and Only emergency history available  Plan Discussed with: CRNA and Surgeon  Anesthesia Plan Comments:         Anesthesia Quick Evaluation

## 2015-07-02 NOTE — Progress Notes (Signed)
Patient arrived to floor from PACU, verbally abusive to staff, patient stating she needed to use bathroom, however, began yelling and refused to allow staff to ambulate to bathroom, risks of fall discussed with patient, staff assisted to bathroom, however, patient pulling at IV pole and throwing up her hands very aggressively, refusing night medications and VS monitoring upon arrival, patient finally agreed to have BP taken and agreeable to cardiac monitoring, patient with bouts of yelling and anger followed by please and thank you with smiles, at this time patient requesting all staff to leave room and her alone, small amount of bleeding noted at incision site, dressing reinforced, dry and intact at this time, bed alarm on and maintained, though, patient upset about having alarm on, curtain closed for privacy per request, will continue frequent checks and close monitoring.

## 2015-07-02 NOTE — Progress Notes (Signed)
Dr. Brantley Stage notifed Patient's hematoma is getting larger and bruised.  I marked the hematoma.  And order for H and H received.

## 2015-07-02 NOTE — Progress Notes (Signed)
Patient constantly yelling aloud. Voicing her frustrations about this hospital stay, leaving AMA, and wanting a doctor to come see her. Per son "she's upset that she doesn't know the plan". Multiple attempts by healthcare workers to update patient,  provide care, and share the plan. Son is agreeable to another attempt for communication.  The DD and AD in to see patient. Extensive conversation with patient and patient's son re: the need for continued nursing care. Patient states she did not know a a hematoma could develop. Apologized for her not knowing and the change in plan re: discharge today. Reviewed with patient and son, Dr. Gala Lewandowsky progress note. The plan to remain in the hospital today, NPO after MN and Dr. Barry Dienes to examine tomorrow for further treatment. Patient able to verbalize understanding. Also explained to patient the necessity of nursing staff being allowed to complete assessments, do vital signs, and have her cooperation throughout this process. Patient tearful, apologetic and agreeable at this moment. Will continue to monitor.

## 2015-07-02 NOTE — Progress Notes (Signed)
Called to pt.'s. room..moderate size hematoma l chest above incision site. Non-tender however discolored. Pt. very upset, tearful. MD on call notified. Requested VS not be taken @ this time.

## 2015-07-02 NOTE — Progress Notes (Signed)
Patient ID: Catherine [name], female   DOB: 1961/02/13, 54 y.o.   MRN: [id number]  [facility]  POD#: 1  Subjective: Patient very agitated, angry.  Has dismissed more than one nurse.  Pain well controlled with morphine IV.  Very concerned about post op hematoma that has formed.  Objective: Vital signs in last 24 hours: Temp:  [97.7 F (36.5 C)-98.1 F (36.7 C)] 98 F (36.7 C) (11/22 2117) Pulse Rate:  [64-90] 85 (11/22 2117) Resp:  [14-24] 18 (11/22 2117) BP: (124-174)/(77-109) 144/83 mmHg (11/22 2150) SpO2:  [95 %-100 %] 100 % (11/22 2117) Weight:  [60.192 kg (132 lb 11.2 oz)] 60.192 kg (132 lb 11.2 oz) (11/22 1541)    Intake/Output from previous day: 11/22 0701 - 11/23 0700 In: [id number] [P.O.:120; I.V.:1500] Out: 1430 [Urine:1340; Drains:40; Blood:50] Intake/Output this shift:    Physical Exam: HEENT - sclerae clear, mucous membranes moist Neck - soft Chest - clear bilaterally; port site right upper chest wall clear, dry , and intact Cor - RRR Breast - binder on; dressings dry; wound intact with steristrips in place; moderate soft hematoma along entire anterior chest wall and upper axilla; soft, not tight; skin viable; serosanguinous in JP drain Ext - no edema, non-tender  Lab Results:   Recent Labs  07/02/15 0630  WBC 15.7*  HGB 10.8*  HCT 31.7*  PLT 198   BMET  Recent Labs  07/02/15 0630  NA 140  K 4.1  CL 109  CO2 24  GLUCOSE 137*  BUN 10  CREATININE 0.91  CALCIUM 9.5   PT/INR No results for input(s): LABPROT, INR in the last 72 hours. Comprehensive Metabolic Panel:    Component Value Date/Time   NA 140 07/02/2015 0630   NA 143 06/27/2015 0907   NA 143 06/11/2015 0849   K 4.1 07/02/2015 0630   K 3.6 06/27/2015 0907   K 3.7 06/11/2015 0849   CL 109 07/02/2015 0630   CL 110 06/27/2015 0907   CO2 24 07/02/2015 0630   CO2 26 06/27/2015 0907   CO2 26 06/11/2015 0849   BUN 10 07/02/2015 0630   BUN 14 06/27/2015  0907   BUN 15.6 06/11/2015 0849   CREATININE 0.91 07/02/2015 0630   CREATININE 0.94 06/27/2015 0907   CREATININE 0.9 06/11/2015 0849   GLUCOSE 137* 07/02/2015 0630   GLUCOSE 122* 06/27/2015 0907   GLUCOSE 122 06/11/2015 0849   CALCIUM 9.5 07/02/2015 0630   CALCIUM 10.1 06/27/2015 0907   CALCIUM 10.5* 06/11/2015 0849   AST 16 06/11/2015 0849   ALT 17 06/11/2015 0849   ALKPHOS 94 06/11/2015 0849   BILITOT 0.37 06/11/2015 0849   PROT 7.4 06/11/2015 0849   ALBUMIN 4.4 06/11/2015 0849    Studies/Results: Dg Fluoro Guide Cv Line-no Report  07/01/2015  CLINICAL DATA:  FLOURO GUIDE CV LINE Fluoroscopy was utilized by the requesting physician.  No radiographic interpretation.    Anti-infectives: Anti-infectives    Start     Dose/Rate Route Frequency Ordered Stop   07/01/15 1830  ceFAZolin (ANCEF) IVPB 2 g/50 mL premix     2 g 100 mL/hr over 30 Minutes Intravenous 3 times per day 07/01/15 1543 07/01/15 1926   07/01/15 0930  ceFAZolin (ANCEF) IVPB 2 g/50 mL premix     2 g 100 mL/hr over 30 Minutes Intravenous To [facility] 06/30/15 1309 07/01/15 1040      Assessment & Plans: Status post left mastectomy  Moderate hematoma formation  Patient upset and agitated  Pain well controlled  Will contact Dr. Barry Dienes - decision about return to OR versus observation - patient desires discharge home today  Discussed with nursing staff in the patient's room during my exam  Earnstine Regal, MD, [facility] Office: [place] 07/02/2015

## 2015-07-02 NOTE — Anesthesia Postprocedure Evaluation (Signed)
Anesthesia Post Note  Patient: Catherine [name]  Procedure(s) Performed: Procedure(s) (LRB): EVACUATION HEMATOMA LEFT Mastectomy Site (Left)  Anesthesia Post Evaluation  Last Vitals:  Filed Vitals:   07/02/15 1718 07/02/15 1944  BP: 137/102 156/77  Pulse: 130   Temp:  36.4 C  Resp: 20 19    Last Pain:  Filed Vitals:   07/02/15 1947  PainSc: Asleep                 Rynn Markiewicz

## 2015-07-02 NOTE — Anesthesia Procedure Notes (Signed)
Procedure Name: Intubation Date/Time: 07/02/2015 6:03 PM Performed by: Trixie Deis A Pre-anesthesia Checklist: Patient identified, Timeout performed, Emergency Drugs available, Suction available and Patient being monitored Patient Re-evaluated:Patient Re-evaluated prior to inductionOxygen Delivery Method: Circle system utilized Preoxygenation: Pre-oxygenation with 100% oxygen Intubation Type: IV induction, Cricoid Pressure applied and Rapid sequence Laryngoscope Size: Mac and 3 Grade View: Grade I Tube type: Oral Tube size: 7.0 mm Number of attempts: 1 Airway Equipment and Method: Stylet Placement Confirmation: ETT inserted through vocal cords under direct vision,  breath sounds checked- equal and bilateral and positive ETCO2 Secured at: 21 cm Tube secured with: Tape Dental Injury: Teeth and Oropharynx as per pre-operative assessment

## 2015-07-02 NOTE — Progress Notes (Signed)
Patient repeatedly yelled at and fired nurses.  Refused all assessments and care.  Left AMA, refused to sign AMA form, son signed on her behalf with RN witness. Security called for assistance if needed.  IV removed.  MD notified, will contact patient outpatient.

## 2015-07-02 NOTE — Progress Notes (Signed)
Patient's belongings placed where tele boxes are. Shantel,  patient's friend saw where the belongings are.

## 2015-07-02 NOTE — Progress Notes (Signed)
Patient ID: Catherine [name], female   DOB: 05-21-1961, 54 y.o.   MRN: [id number]  [facility]  I was able to contact Dr. Barry Dienes by telephone this morning.  Dr. Barry Dienes would like for the patient to remain in the hospital today. We will monitor her hematoma on the left chest wall and axilla.  We will make the patient nothing by mouth after midnight.  Dr. Barry Dienes will be in to assess the patient on Thursday morning, July 03, 2015.  At that time she will make a decision regarding possible operative exploration and evacuation of the hematoma versus discharge and continued observation. We will present this option to the patient.  Hopefully she will remain in the hospital.  If the patient insisted on discharge home we will give her a prescription for pain medication and instructions for wound care.  The patient will then return to see Dr. Barry Dienes at the [facility] surgery office early next week.  Earnstine Regal, MD, [facility] Office: [phone]

## 2015-07-02 NOTE — Progress Notes (Signed)
Patient is heavily bleeding with > 350cc out of her Keenan Bachelor drain in the last two hours.  HR 130.  BP a bit soft.  Patient had a fall also.  Will have blood available and likely give one unit in the OR.  Try to get to the OR ASAP.  Patient typed and crossmatched for two units of PRBCs for the OR.  Kathryne Eriksson. Dahlia Bailiff, MD, [place] [phone] [facility]

## 2015-07-02 NOTE — Progress Notes (Signed)
JP drain stripped and returned to suction.

## 2015-07-02 NOTE — Progress Notes (Signed)
1040 Nursing Note:  After security arrived to the department, patient still has not left the unit.  Spoke with the son who thinks his mom now wants to stay and be transferred to [facility].  However, leadership expressed that she can not Lobbyist.

## 2015-07-02 NOTE — Progress Notes (Signed)
Patient very agitated this am. Developed a hematoma. Dr. Harlow Asa assessed patient. Patient refused an assessment by me. Patient requested i not go in the room. Report given to another nurse CJ, RN.

## 2015-07-02 NOTE — Anesthesia Postprocedure Evaluation (Signed)
Anesthesia Post Note  Patient: Catherine [name]  Procedure(s) Performed: Procedure(s) (LRB): EVACUATION HEMATOMA LEFT Mastectomy Site (Left)  Patient location during evaluation: PACU Anesthesia Type: General Level of consciousness: awake and alert Pain management: pain level controlled Vital Signs Assessment: post-procedure vital signs reviewed and stable Respiratory status: spontaneous breathing, nonlabored ventilation, respiratory function stable and patient connected to nasal cannula oxygen Cardiovascular status: blood pressure returned to baseline and stable Postop Assessment: No signs of nausea or vomiting Anesthetic complications: no    Last Vitals:  Filed Vitals:   07/02/15 2111 07/02/15 2115  BP:    Pulse:  102  Temp: 36.6 C 36.6 C  Resp: 17 13    Last Pain:  Filed Vitals:   07/02/15 2118  PainSc: Asleep                 Jacinto Keil,W. EDMOND

## 2015-07-02 NOTE — Progress Notes (Signed)
Called Dr Hulen Skains regarding patients transfusions and current VS. Will draw stat CBC and give  2 units of FFP when ready. Will continue to monitor.

## 2015-07-02 NOTE — Progress Notes (Signed)
Patient feel in room and called me. She said she was getting up to turn down the heat and she thinks she passed out. Incision bleeding and bleeding around JP site. BP 95/81 pulse 124 Dr. Brantley Stage notified and orders received. Iv fluids started.

## 2015-07-02 NOTE — Progress Notes (Signed)
Dr. Brantley Stage notified of patient's hematoma getting larger and busied looking. Since 1200 JP drained 140. H and H ordered.

## 2015-07-03 DIAGNOSIS — D500 Iron deficiency anemia secondary to blood loss (chronic): Secondary | ICD-10-CM | POA: Diagnosis not present

## 2015-07-03 DIAGNOSIS — I959 Hypotension, unspecified: Secondary | ICD-10-CM | POA: Diagnosis not present

## 2015-07-03 DIAGNOSIS — M96841 Postprocedural hematoma of a musculoskeletal structure following other procedure: Secondary | ICD-10-CM | POA: Diagnosis not present

## 2015-07-03 DIAGNOSIS — F17210 Nicotine dependence, cigarettes, uncomplicated: Secondary | ICD-10-CM | POA: Diagnosis present

## 2015-07-03 DIAGNOSIS — Z8249 Family history of ischemic heart disease and other diseases of the circulatory system: Secondary | ICD-10-CM | POA: Diagnosis not present

## 2015-07-03 DIAGNOSIS — F319 Bipolar disorder, unspecified: Secondary | ICD-10-CM | POA: Diagnosis present

## 2015-07-03 DIAGNOSIS — N6453 Retraction of nipple: Secondary | ICD-10-CM | POA: Diagnosis present

## 2015-07-03 DIAGNOSIS — C50212 Malignant neoplasm of upper-inner quadrant of left female breast: Secondary | ICD-10-CM | POA: Diagnosis present

## 2015-07-03 DIAGNOSIS — Z170 Estrogen receptor positive status [ER+]: Secondary | ICD-10-CM | POA: Diagnosis not present

## 2015-07-03 DIAGNOSIS — C50912 Malignant neoplasm of unspecified site of left female breast: Secondary | ICD-10-CM | POA: Diagnosis present

## 2015-07-03 DIAGNOSIS — Y838 Other surgical procedures as the cause of abnormal reaction of the patient, or of later complication, without mention of misadventure at the time of the procedure: Secondary | ICD-10-CM | POA: Diagnosis not present

## 2015-07-03 DIAGNOSIS — E7800 Pure hypercholesterolemia, unspecified: Secondary | ICD-10-CM | POA: Diagnosis present

## 2015-07-03 LAB — CBC WITH DIFFERENTIAL/PLATELET
BASOS PCT: 0 %
Basophils Absolute: 0 10*3/uL (ref 0.0–0.1)
EOS ABS: 0 10*3/uL (ref 0.0–0.7)
EOS PCT: 0 %
HCT: 24.3 % — ABNORMAL LOW (ref 36.0–46.0)
HEMOGLOBIN: 8.3 g/dL — AB (ref 12.0–15.0)
LYMPHS ABS: 4.2 10*3/uL — AB (ref 0.7–4.0)
Lymphocytes Relative: 37 %
MCH: 29.9 pg (ref 26.0–34.0)
MCHC: 34.2 g/dL (ref 30.0–36.0)
MCV: 87.4 fL (ref 78.0–100.0)
MONOS PCT: 6 %
Monocytes Absolute: 0.7 10*3/uL (ref 0.1–1.0)
NEUTROS PCT: 57 %
Neutro Abs: 6.4 10*3/uL (ref 1.7–7.7)
PLATELETS: 110 10*3/uL — AB (ref 150–400)
RBC: 2.78 MIL/uL — AB (ref 3.87–5.11)
RDW: 14.5 % (ref 11.5–15.5)
WBC: 11.3 10*3/uL — AB (ref 4.0–10.5)

## 2015-07-03 LAB — PREPARE FRESH FROZEN PLASMA
UNIT DIVISION: 0
Unit division: 0

## 2015-07-03 NOTE — Progress Notes (Signed)
1 Day Post-Op  Subjective: No complaints today. Went back to OR last night for bleeding  Objective: Vital signs in last 24 hours: Temp:  [97.5 F (36.4 C)-99.3 F (37.4 C)] 99 F (37.2 C) (11/24 0700) Pulse Rate:  [85-130] 95 (11/24 0500) Resp:  [11-25] 20 (11/24 0500) BP: (95-156)/(77-102) 125/88 mmHg (11/24 0500) SpO2:  [92 %-100 %] 100 % (11/24 0500) Arterial Line BP: (95-192)/(49-102) 95/49 mmHg (11/24 0500) Last BM Date: 07/02/15 Transfused last PM 2 units PRBC and 2 units of FFP Drain:  805 Afebrile, VSS H/H 8.3/24.3  Intake/Output from previous day: 11/23 0701 - 11/24 0700 In: 5616 [P.O.:480; I.V.:3966; Blood:670; IV Piggyback:500] Out: 2680 [Urine:1525; Emesis/NG output:350; Drains:805] Intake/Output this shift:    Resp: clear to auscultation bilaterally Chest wall: skin flaps look ok. minimal swelling. drain output serosanguinous Cardio: regular rate and rhythm GI: soft, non-tender; bowel sounds normal; no masses,  no organomegaly  Lab Results:   Recent Labs  07/02/15 2005 07/03/15 0508  WBC 15.6* 11.3*  HGB 9.3* 8.3*  HCT 27.7* 24.3*  PLT 119* 110*    BMET  Recent Labs  07/02/15 0630 07/02/15 1838 07/02/15 1850  NA 140 143 143  K 4.1 2.9* 3.1*  CL 109  --   --   CO2 24  --   --   GLUCOSE 137* 134* 130*  BUN 10  --   --   CREATININE 0.91  --   --   CALCIUM 9.5  --   --    PT/INR  Recent Labs  07/02/15 1856  LABPROT 17.9*  INR 1.47    No results for input(s): AST, ALT, ALKPHOS, BILITOT, PROT, ALBUMIN in the last 168 hours.   Lipase  No results found for: LIPASE   Studies/Results: Dg Fluoro Guide Cv Line-no Report  07/01/2015  CLINICAL DATA:  FLOURO GUIDE CV LINE Fluoroscopy was utilized by the requesting physician.  No radiographic interpretation.    Medications: . docusate sodium  100 mg Oral BID    Assessment/Plan Right breast cancer, [id number] S/p Left Mastectomy with Sentinel Node Biopsy, Right subclavian port placement,  07/01/15, Dr Barry Dienes Post op hematoma Signed out AMA/then decided to stay security called Fall in room with heavy bleeding after that [place] LEFT Mastectomy Site and control of bleeding, 06/22/15 Dr. Hulen Skains 7PM Anemia secondary to blood loss  Continue to monitor for bleeding in stepdown today Advance diet      Catherine [name],Catherine [name] 07/03/2015

## 2015-07-03 NOTE — Progress Notes (Signed)
0800:  Patient angry and agitated during dayshift.  Patient given RN phone number so patient needs could be met quicker and more efficiently.     1100:  Patient was tearful when Surgeon came by, RN was present and consoled patient by holding hand and wiping tears.  RN showed Surgeon Patient's stained chest wall binder, JP drain also emptied at this time.   Patient made comments to family about "trash" that works at [employer], how her family "knows how she is".  Attempts to not engage were made.  Patient's family offered refreshments.  This occurred multiple times throughout shift.  1500: Patients son called and wanted information.  Son was educated on Building services engineer and privacy and instructed to call mother for update.    1600: went to check on patient and she started yelling that I was rude to her son.  Proceeded to call me "white trash" appeared to be getting out of bed to approach me, in which I felt threatened. I informed her I would get her another nurse. She told me to not come back.  I notified Sherle Poe, RN, [facility] who came to speak with patient.  Patient could be heard screaming at [place] in hallway.

## 2015-07-04 MED ORDER — OXYCODONE-ACETAMINOPHEN 5-325 MG PO TABS: 1.0000 | ORAL_TABLET | ORAL | Status: DC | PRN

## 2015-07-04 NOTE — Progress Notes (Signed)
Pt states she left her dress on [place], Network engineer called [place] and PACU to try to locate dress.  Unable to find dress. Pt very upset and left with her son. Pt did not want to wait for wheelchair. Consuelo Pandy RN

## 2015-07-04 NOTE — Progress Notes (Signed)
Pt is still verbally aggressive requesting to see the MD. She states " I am leaving out of here today, i'll sign myself out". Pt upset stating she had to pee in cups due to her not using her call bell because the one that answered the call bell had an attitude. So she did not call out. Pt requested not to be bothered throughout the night earlier in shift and agreed to call out with needs. Pt called out at 0530 and RN was sitting outside of room. This RN went right into room and pt was assisted to the [facility] and was set up for bath at her request. Pt used the [facility] and bathed herself and denied needing any help. Dayshift RN updated on pt behavior. This RN and Emergency planning/management officer went into patients room at shift change, pt requesting to see the MD. Pt informed RN will reach out to the MD and make him aware of her request.

## 2015-07-04 NOTE — Progress Notes (Signed)
Pt continuing to ask for MD, she states she does not feel safe here. Continue to be supportive to pt with frequent contact. Family at bedside visiting with pt. Pt is smiling and laughing with mother. Consuelo Pandy RN

## 2015-07-04 NOTE — Progress Notes (Signed)
2 Days Post-Op  Subjective: No complaints. Feels much better  Objective: Vital signs in last 24 hours: Temp:  [99 F (37.2 C)-99.5 F (37.5 C)] 99 F (37.2 C) (11/24 1929) Pulse Rate:  [80-95] 80 (11/25 0400) Resp:  [17-22] 19 (11/25 0400) BP: (137-164)/(91-93) 164/91 mmHg (11/24 1929) SpO2:  [97 %-100 %] 98 % (11/25 0400) Arterial Line BP: (113-189)/(65-102) 123/65 mmHg (11/25 0400) Last BM Date: 07/02/15 630 PO recorded Limited VS Multiple notes from nursing, pt angry, abusive, and aggressive H/H 8.3/24 Platelets 110K No films Intake/Output from previous day: 11/24 0701 - 11/25 0700 In: 3390 [P.O.:630; I.V.:2760] Out: 1920 [Urine:1650; Drains:270] Intake/Output this shift: Total I/O In: 480 [P.O.:120; I.V.:360] Out: 400 [Urine:400]  Resp: clear to auscultation bilaterally Chest wall: skin flaps are viable. drain output serosanguinous Cardio: regular rate and rhythm GI: soft, non-tender; bowel sounds normal; no masses,  no organomegaly  Lab Results:   Recent Labs  07/02/15 2005 07/03/15 0508  WBC 15.6* 11.3*  HGB 9.3* 8.3*  HCT 27.7* 24.3*  PLT 119* 110*    BMET  Recent Labs  07/02/15 0630 07/02/15 1838 07/02/15 1850  NA 140 143 143  K 4.1 2.9* 3.1*  CL 109  --   --   CO2 24  --   --   GLUCOSE 137* 134* 130*  BUN 10  --   --   CREATININE 0.91  --   --   CALCIUM 9.5  --   --    PT/INR  Recent Labs  07/02/15 1856  LABPROT 17.9*  INR 1.47    No results for input(s): AST, ALT, ALKPHOS, BILITOT, PROT, ALBUMIN in the last 168 hours.   Lipase  No results found for: LIPASE   Studies/Results: No results found.  Medications: . docusate sodium  100 mg Oral BID   . sodium chloride 120 mL/hr at 07/04/15 0900  . dextrose 5 % and 0.45 % NaCl with KCl 20 mEq/L 100 mL/hr at 07/01/15 1500   Prior to Admission medications   Medication Sig Start Date End Date Taking? Authorizing Provider  morphine (MSIR) 15 MG tablet Take 1 tablet (15 mg total) by  mouth every 6 (six) hours as needed for severe pain. 07/02/15   Armandina Gemma, MD     Assessment/Plan S/p Left Mastectomy with Sentinel Node Biopsy, Right subclavian port placement, 07/01/15, Dr Barry Dienes Post op hematoma Signed out AMA/then decided to stay security called Fall in room with heavy bleeding after that EVACUATION HEMATOMA LEFT Mastectomy Site and control of bleeding, 06/22/15 Dr. Hulen Skains 7PM Anxiety/Biopolar behavior?  Anemia secondary to blood loss  Discharge today   LOS: 1 day    JENNINGS,WILLARD 07/04/2015

## 2015-07-04 NOTE — Progress Notes (Signed)
At [place] went in to assess IV site. Pt called out requesting RN to come look. Pt verbally abusive when entered room stating no one cares about her and she had a bad day, also requesting to be moved to another unit because she does not feel like she is properly taken care of. Pt also states she wants to rip everything off and go home. This RN told pt my shift just started and I was here to help her with her needs. Pt told this RN not to come back in room. Refused to be assessed at this time. Requested to talk to a doctor now. This RN communicated the need to fix the IV dressing. Pt agreed. Pt then became very tearful and stated she was depressed. Pt agreed to be assessed after consoled. Pt requested not to be bothered anymore. She wished to go to sleep. Will continue to monitor.

## 2015-07-05 NOTE — Progress Notes (Signed)
Quick Note:  Please let patient know that margins and LN are negative. ______

## 2015-07-06 LAB — TYPE AND SCREEN
ABO/RH(D): B POS
ANTIBODY SCREEN: NEGATIVE
UNIT DIVISION: 0
UNIT DIVISION: 0
UNIT DIVISION: 0
UNIT DIVISION: 0
UNIT DIVISION: 0
UNIT DIVISION: 0
UNIT DIVISION: 0
Unit division: 0
Unit division: 0
Unit division: 0

## 2015-07-07 ENCOUNTER — Encounter (HOSPITAL_COMMUNITY): Payer: Self-pay | Admitting: General Surgery

## 2015-07-07 ENCOUNTER — Ambulatory Visit: Payer: Medicaid Other | Admitting: Oncology

## 2015-07-07 ENCOUNTER — Telehealth: Payer: Self-pay | Admitting: *Deleted

## 2015-07-07 NOTE — Telephone Encounter (Signed)
Pt called very upset with her stay at [facility]. Pt relayed she wanted to speak to someone about her experience "so no one else would go through what I did". Pt stated the staff and nurses were "horrible", her "dress was stolen" and she "almost died". Expressed concern and attempted to calm and console pt. Relate she was unable to really talk to me b/c she had people around her and she couldn't "talk to you the way I want to". Expressed concern to pt and apologized for her experience.  Informed pt that I would find the numbers that she can call to discuss her experience.   Called DD of [phone] to discuss pt want to discuss her experience. Gave her pt contact information. Left vm for pt with contact information of the DD and pt experience to discuss her experience. Request a return call to discuss f/u appt with Dr. Jana Hakim today (11/28).

## 2015-07-08 ENCOUNTER — Telehealth: Payer: Self-pay | Admitting: *Deleted

## 2015-07-08 ENCOUNTER — Encounter: Payer: Self-pay | Admitting: Oncology

## 2015-07-08 ENCOUNTER — Telehealth: Payer: Self-pay | Admitting: Oncology

## 2015-07-08 ENCOUNTER — Ambulatory Visit (HOSPITAL_COMMUNITY): Payer: Medicaid Other

## 2015-07-08 ENCOUNTER — Other Ambulatory Visit: Payer: Self-pay | Admitting: Oncology

## 2015-07-08 DIAGNOSIS — C50911 Malignant neoplasm of unspecified site of right female breast: Secondary | ICD-10-CM

## 2015-07-08 DIAGNOSIS — C50212 Malignant neoplasm of upper-inner quadrant of left female breast: Secondary | ICD-10-CM

## 2015-07-08 NOTE — Telephone Encounter (Signed)
[facility] will call re scan appointments. No other orders per either 11/29 pof.

## 2015-07-08 NOTE — Telephone Encounter (Signed)
Pt called upset d/t discussion with FC concerning receiving assistance. Calmed pt and discussed process. Pt was deescalated and able to speak calmly.  Scheduled a new f/u appt with Dr. Jana Hakim on 12/23 at 1030 to discuss final pathology, staging scans and next steps in treatment. Gave pt emotional support and encouragement. Pt relate gratitude for assistance in navigating her through her journey.   Continue to encourage pt to call with needs, questions and concerns. Received verbal understanding.

## 2015-07-08 NOTE — Telephone Encounter (Signed)
Received order for oncotype testing per Dr. Jana Hakim. Requisition sent to pathology. Received by Tammy. Called pt to discuss testing. Received verbal understanding.

## 2015-07-08 NOTE — Progress Notes (Signed)
Pt called today requesting financial assistance.  After reviewing her chart I tried to explain to her in order to qualify for financial assistance she has to be in active treatment and once she begins active treatment we can proceed further.  She mentioned something about her surgeon so I advised if she needed assistance with her surgeon she has to call his office to see what programs they have.  She wasn't happy to hear that and became very irate and began yelling on the phone.  Despite her behavior, I remained respectful and she eventually hung up on me.

## 2015-07-09 NOTE — Telephone Encounter (Addendum)
"  Lilia Pro with [employer] [phone] opt 2) called.  Voicemail that information received yesterday is coming up invalid."

## 2015-07-11 ENCOUNTER — Encounter: Payer: Self-pay | Admitting: *Deleted

## 2015-07-11 NOTE — Progress Notes (Signed)
Catherine [name]  Clinical Social [name] was referred by patient navigator for assessment of psychosocial needs due to finances and emotional support needs.  Clinical Social Worker contacted patient at home to offer support and assess for needs.  Pt declined talking with CSW, stating she "had already talked to everyone". CSW explained role of CSW team and pt stated she didn't want to talk. CSW explained we were here if she changed her mind. She said "thanks and have a good weekend" and hung up. CSW available if additional needs arise.   Clinical Social [name] interventions: Resource education  Catherine [name], [name]  [name] Phone: [phone] Fax: [phone]

## 2015-07-14 ENCOUNTER — Encounter: Payer: Self-pay | Admitting: Oncology

## 2015-07-14 ENCOUNTER — Encounter: Payer: Self-pay | Admitting: *Deleted

## 2015-07-14 NOTE — Progress Notes (Signed)
Contacted pt per request of Abigail(SW) to introduce myself as Estate manager/land agent. Also advised pt after her next visit on 12/23, I will review her treatment plan and contact her for follow up to see if she has any financial questions or concerns at that time and what may be available to her. Patient said ok and she will see me after her visit.

## 2015-07-16 ENCOUNTER — Encounter: Payer: Self-pay | Admitting: *Deleted

## 2015-07-16 ENCOUNTER — Encounter (HOSPITAL_COMMUNITY): Payer: Medicaid Other

## 2015-07-16 ENCOUNTER — Ambulatory Visit (HOSPITAL_COMMUNITY): Payer: Medicaid Other

## 2015-07-21 ENCOUNTER — Encounter (HOSPITAL_COMMUNITY): Payer: Self-pay

## 2015-07-21 ENCOUNTER — Telehealth: Payer: Self-pay | Admitting: *Deleted

## 2015-07-21 NOTE — Telephone Encounter (Signed)
Received Oncotype score of 16/10%. Left vm for pt to return call to give results.

## 2015-07-22 ENCOUNTER — Encounter (HOSPITAL_COMMUNITY): Admission: RE | Admit: 2015-07-22 | Payer: Medicaid Other | Source: Ambulatory Visit

## 2015-07-22 ENCOUNTER — Encounter: Payer: Self-pay | Admitting: *Deleted

## 2015-07-22 ENCOUNTER — Encounter (HOSPITAL_COMMUNITY): Payer: Medicaid Other

## 2015-07-22 ENCOUNTER — Ambulatory Visit (HOSPITAL_COMMUNITY): Payer: Medicaid Other

## 2015-07-22 NOTE — Progress Notes (Signed)
Pt stopped by to inform me that she decided not to have her CT/Bone scan d/t her oncotype score came back low and didn't need chemo. Pt in good spirits and thankful for helping her. Confirmed appt with Dr. Jana Hakim on 12/23. Encourage pt to call with needs or questions. Received verbal understanding.

## 2015-07-24 ENCOUNTER — Encounter: Payer: Self-pay | Admitting: *Deleted

## 2015-07-24 ENCOUNTER — Other Ambulatory Visit (HOSPITAL_COMMUNITY): Payer: Medicaid Other

## 2015-07-24 NOTE — Progress Notes (Signed)
Called pt to inform that I reached out to Dr. Barry Dienes and her nurse about removing her port d/t she will not need chemo. Jearld Fenton, will reach out to her and discuss this with the pt.  Pt relate she has great frustrations that she has not received any f/u from patient experience or from "Collie Siad' concerning reimbursement for her "stolen dress", I reached out to [facility] in patient experience to request a f/u with pt concerning letter or call to pt for f/u about her experience. I was told that her case was also given to the grievance department in risk management. I reached out to risk management and request they f/u with her about her case as well.

## 2015-07-28 ENCOUNTER — Encounter: Payer: Self-pay | Admitting: *Deleted

## 2015-07-28 ENCOUNTER — Ambulatory Visit (HOSPITAL_COMMUNITY): Payer: Medicaid Other

## 2015-07-28 ENCOUNTER — Encounter (HOSPITAL_COMMUNITY): Payer: Medicaid Other

## 2015-07-29 ENCOUNTER — Encounter: Payer: Self-pay | Admitting: *Deleted

## 2015-07-30 ENCOUNTER — Telehealth: Payer: Self-pay | Admitting: *Deleted

## 2015-07-30 NOTE — Telephone Encounter (Signed)
Pt called to request an appt with Dr. Barry Dienes to have her port removed. Informed pt that I have left her nurse a msg for a return call to scheduled the appt. Pt would like to get some mastectomy supplies (bra and prosthesis). Informed pt that I would call [facility] and see what is available. Discussed I would call pt to give updates on her requests. Received verbal understanding.

## 2015-07-31 ENCOUNTER — Encounter: Payer: Self-pay | Admitting: *Deleted

## 2015-07-31 NOTE — Progress Notes (Signed)
Pt called to relate she will be unable to make her appt tomorrow d/t child care. She wishes to be called with an appt tomorrow for Jan in the morning.

## 2015-08-01 ENCOUNTER — Ambulatory Visit: Payer: Medicaid Other | Admitting: Oncology

## 2015-08-05 ENCOUNTER — Other Ambulatory Visit: Payer: Self-pay | Admitting: General Surgery

## 2015-08-06 NOTE — Discharge Summary (Signed)
Physician Discharge Summary  Patient ID: Catherine [name] MRN: [id number] DOB/AGE: 1961-02-10 54 y.o.  Admit date: 07/01/2015 Discharge date: 08/06/2015  Admission Diagnoses: Left breast cancer, upper inner quadrant Bipolar disorder, untreated Tobacco abuse  Discharge Diagnoses:  Active Problems:   Breast cancer, left ([place]) bipolar disorder Post operative bleeding  Discharged Condition: stable  Hospital Course:  Patient is a 54 year old female who was admitted to the floor following a left mastectomy. Patient has significant psychiatric issues and is very anxious. She developed postoperative bleeding and hypotension. She was taken back to the OR on postop day 1 by Dr. Hulen Skains. She did well postoperatively after surgery and after getting some blood. She was kept in stepdown or several days and then transferred back upstairs. She is extremely frustrated regarding a dress that went missing with her transfer. She wanted to sign out AMA but then decided to stay. She was very angry with her nursing staff and fired multiple of her caregivers. I think most all of this is related to her bipolar disorder. With physicians she is usually very direct and nice. She is also quite anxious regarding her diagnosis.  Consults: None  Significant Diagnostic Studies: labs: HCT 24.3 prior to d/c.    Treatments: surgery: left mastectomy, SLN bx, port, followed by takeback for bleeding.    Discharge Exam: Blood pressure 164/91, pulse 80, temperature 99 F (37.2 C), temperature source Oral, resp. rate 19, height 5\' 6"  (1.676 m), weight 60.192 kg (132 lb 11.2 oz), last menstrual period 06/30/2013, SpO2 98 %. General appearance: alert, cooperative and mild distress Resp: breathing comfortably Chest wall: left sided chest wall tenderness, as expected.   Cardio: regular rate and rhythm Drains serosang.  Disposition: 01-Home or Self Care  Discharge Instructions    Call MD for:  difficulty breathing, headache  or visual disturbances    Complete by:  As directed      Call MD for:  extreme fatigue    Complete by:  As directed      Call MD for:  hives    Complete by:  As directed      Call MD for:  persistant dizziness or light-headedness    Complete by:  As directed      Call MD for:  persistant nausea and vomiting    Complete by:  As directed      Call MD for:  redness, tenderness, or signs of infection (pain, swelling, redness, odor or green/yellow discharge around incision site)    Complete by:  As directed      Call MD for:  severe uncontrolled pain    Complete by:  As directed      Call MD for:  temperature >100.4    Complete by:  As directed      Diet - low sodium heart healthy    Complete by:  As directed      Discharge instructions    Complete by:  As directed   Sponge bathe while drains are in. No overhead activity. Diet as tolerated. Empty drain, record output, and recharge bulb twice a day     Increase activity slowly    Complete by:  As directed      No wound care    Complete by:  As directed             Medication List    TAKE these medications        morphine 15 MG tablet  Commonly known as:  MSIR  Take 1 tablet (15 mg total) by mouth every 6 (six) hours as needed for severe pain.     oxyCODONE-acetaminophen 5-325 MG tablet  Commonly known as:  ROXICET  Take 1-2 tablets by mouth every 4 (four) hours as needed.           Follow-up Information    Follow up with [facility], MD In 2 weeks.   Specialty:  General Surgery   Contact information:   [address] [place] [address] [phone]       Signed: [name] 08/06/2015, 9:34 AM

## 2015-08-07 ENCOUNTER — Telehealth: Payer: Self-pay | Admitting: *Deleted

## 2015-08-07 NOTE — Telephone Encounter (Signed)
Left a vm for pt to give update on requests she made. Informed [facility] scheduling will call with port removal- pt request not a [facility] and to be a morning appt. Pt also request mastectomy supplies that are without cost. Left msg stating I have reached out to [facility] [place] regarding donated supplied. Informed pt that I will continue to update her on these requests.

## 2015-08-13 ENCOUNTER — Encounter: Payer: Self-pay | Admitting: *Deleted

## 2015-08-13 NOTE — Progress Notes (Signed)
Pt called relate doing well and without complaints. Unable to make appt with Dr. Jana Hakim r/s to 1/25 at 0900.

## 2015-08-15 ENCOUNTER — Ambulatory Visit: Payer: Medicaid Other | Admitting: Oncology

## 2015-08-21 ENCOUNTER — Encounter: Payer: Self-pay | Admitting: *Deleted

## 2015-09-01 ENCOUNTER — Encounter (HOSPITAL_BASED_OUTPATIENT_CLINIC_OR_DEPARTMENT_OTHER): Payer: Self-pay | Admitting: *Deleted

## 2015-09-02 ENCOUNTER — Other Ambulatory Visit: Payer: Medicaid Other | Admitting: Family Medicine

## 2015-09-02 ENCOUNTER — Other Ambulatory Visit (HOSPITAL_COMMUNITY)
Admission: RE | Admit: 2015-09-02 | Discharge: 2015-09-02 | Disposition: A | Discharge status: Home / Self Care | Payer: Medicaid Other | Source: Ambulatory Visit | Attending: Family Medicine | Admitting: Family Medicine

## 2015-09-02 DIAGNOSIS — N760 Acute vaginitis: Secondary | ICD-10-CM | POA: Insufficient documentation

## 2015-09-02 DIAGNOSIS — Z113 Encounter for screening for infections with a predominantly sexual mode of transmission: Secondary | ICD-10-CM | POA: Diagnosis present

## 2015-09-03 ENCOUNTER — Telehealth: Payer: Self-pay | Admitting: Oncology

## 2015-09-03 ENCOUNTER — Other Ambulatory Visit: Payer: Self-pay | Admitting: Oncology

## 2015-09-03 ENCOUNTER — Encounter: Payer: Self-pay | Admitting: *Deleted

## 2015-09-03 ENCOUNTER — Ambulatory Visit (HOSPITAL_BASED_OUTPATIENT_CLINIC_OR_DEPARTMENT_OTHER): Payer: Medicaid Other | Admitting: Oncology

## 2015-09-03 VITALS — BP 143/92 | HR 83 | Temp 98.4°F | Resp 18 | Ht 66.0 in | Wt 133.5 lb

## 2015-09-03 DIAGNOSIS — F319 Bipolar disorder, unspecified: Secondary | ICD-10-CM | POA: Diagnosis not present

## 2015-09-03 DIAGNOSIS — Z79810 Long term (current) use of selective estrogen receptor modulators (SERMs): Secondary | ICD-10-CM | POA: Diagnosis not present

## 2015-09-03 DIAGNOSIS — Z170 Estrogen receptor positive status [ER+]: Secondary | ICD-10-CM

## 2015-09-03 DIAGNOSIS — Z720 Tobacco use: Secondary | ICD-10-CM

## 2015-09-03 DIAGNOSIS — C50212 Malignant neoplasm of upper-inner quadrant of left female breast: Secondary | ICD-10-CM | POA: Diagnosis not present

## 2015-09-03 DIAGNOSIS — F3160 Bipolar disorder, current episode mixed, unspecified: Secondary | ICD-10-CM

## 2015-09-03 MED ORDER — TAMOXIFEN CITRATE 20 MG PO TABS: 20.0000 mg | ORAL_TABLET | Freq: Every day | ORAL | Status: AC

## 2015-09-03 NOTE — Progress Notes (Signed)
[facility] [place]  [phone]) [phone] Fax:(336) [phone]     ID: Catherine [name] DOB: 06-01-1961  MR#: [id number]  JYN#:[id number]  Patient Care Team: Lucianne Lei, MD as PCP - General (Family Medicine) [name], MD as Consulting Physician (General Surgery) Chauncey Cruel, MD as Consulting Physician (Oncology) Arloa Koh, MD as Consulting Physician (Radiation Oncology) Mauro Kaufmann, RN as Registered Nurse Rockwell Germany, RN as Registered Nurse PCP: Elyn Peers, MD GYN: OTHER MD:   CHIEF COMPLAINT: Locally advanced breast cancer  CURRENT TREATMENT: tamoxifen   BREAST CANCER HISTORY: From the original intake note:  Catherine [name] tells me she has always had many breast cysts, and that the 1 in the left breast that turned out to be cancer didn't seem any different from any of the other ones. She did notice some skin dimpling so after a few months she brought it to the attention of Dr. Criss Rosales and on 05/28/2015 she underwent bilateral diagnostic mammography with tomosynthesis and left breast ultrasonography at [facility]. The most recent mammogram prior to this was 2009. The breast density was category C. In the left breast upper inner quadrant there was a 4 cm irregular mass associated with architectural distortion and nipple retraction. By ultrasound this measured 4 cm, and had micro-lobulated margins. There was a lymph node in the left axilla with a focus of cortical thickening.  On 06/05/2015 the patient underwent biopsy of the breast mass in question. This showed (SAA [id number]) and invasive ductal carcinoma, grade 2 or 3, estrogen receptor 95% positive, progesterone receptor 40% positive, both with strong staining intensity, with an MIB-1 of 20%, and Catherine-2 equivocal for amplification, the signals ratio being 1.43 but the number per cell 4.38. Additional studies are pending to clarify the Catherine-2 status.  Biopsy of the abnormal lymph node in the left axilla was also scheduled  for 06/05/2015 but the patient refused  The patient's subsequent history is as detailed below  INTERVAL HISTORY: [employer] today for follow-up of Catherine locally advanced breast cancer accompanied by Catherine [name]. Since Catherine last visit here she underwent left mastectomy with sentinel lymph node sampling. The procedure, on 07/01/2015 found a 6.7 cm invasive ductal carcinoma, grade 2, with ample margins. All 4 Sentinel lymph nodes removed were clear. Prognostic panel showed the tumor to be estrogen receptor positive at 100%, progesterone receptor positive at 20%, both with strong staining intensity, with an MIB-1 of 30%, and no Catherine-2 amplification, the signals ratio being 1.25 and the number per cell 3.30.  We obtained an Oncotype on this tumor which showed a score of 16,predicting a risk of outside the breast recurrence of 10% within 10 years if robins only systemic therapy is tamoxifen for 5 years. She is here today to discuss anti-estrogens   REVIEW OF SYSTEMS: Catherine [name] tells me she had a "terrible experience" at [facility]. She says the staff were not dealing with Catherine drains correctly, did not take care of Catherine blood pressure, did not know she had passed out in Catherine room, and as a result she had bleeding when she fell, and had to go back to surgery and have a hematoma drained. Otherwise she did well with the surgery, and takes some hydrocodone that a friend offered for pain for a few days. She is currently on no medications. She denies unusual headaches, visual changes, nausea, vomiting, dizziness, or gait imbalance. A detailed review of systems today was otherwise negative.   PAST MEDICAL HISTORY: Past Medical History  Diagnosis  Date  . Breast cancer of upper-inner quadrant of left female breast ([place]) 06/09/2015  . Breast cancer ([place])   . Depression   . Anxiety     pt very anxious on phone    PAST SURGICAL HISTORY: Past Surgical History  Procedure Laterality Date  . Evacuation breast  hematoma Left 07/02/2015    Procedure: EVACUATION HEMATOMA LEFT Mastectomy Site;  Surgeon: Judeth Horn, MD;  Location: [place];  Service: General;  Laterality: Left;  Marland Kitchen Mastectomy w/ sentinel node biopsy Left 07/01/2015    Procedure: LEFT MASTECTOMY WITH SENTINEL LYMPH NODE BIOPSY;  Surgeon: [name], MD;  Location: [place];  Service: General;  Laterality: Left;  . Portacath placement N/A 07/01/2015    Procedure: INSERTION PORT-A-CATH;  Surgeon: [name], MD;  Location: [place];  Service: General;  Laterality: N/A;    FAMILY HISTORY No family history on file. The patient's parents are in their mid to late 33s. Catherine [name] had 4 brothers, 2 sisters. There is a history of prostate and brain cancer on the maternal side but no history of ovarian or breast cancer  GYNECOLOGIC HISTORY:  Patient's last menstrual period was 06/30/2013. Menarche age 56, first live birth age 17. The patient is GX P2. She stopped having periods at age 38. She did not take hormone replacement. She never used oral contraceptives.  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 At home is just she and Catherine "baby" Catherine [name], 45, who is going to school. Catherine [name], 89, works for the [employer] and lives in [place]. The patient has 2 grandchildren. She had dense a local [employer] (harvest gathering".    ADVANCED DIRECTIVES: Not in place. At the initial clinic visit the patient was given the appropriate forms to complete and notarize at Catherine discretion.     HEALTH MAINTENANCE: Social History  Substance Use Topics  . Smoking status: Current Every Day Smoker -- 1.00 packs/day for 30 years    Types: Cigarettes  . Smokeless tobacco: Not on file  . Alcohol Use: 0.6 oz/week    1 Glasses of wine per week     Comment: daily     Colonoscopy: Never  PAP:  Bone density: Never  Lipid panel:  Allergies  Allergen Reactions  . Codeine Nausea And Vomiting    . Metronidazole Nausea And Vomiting    Current Outpatient Prescriptions  Medication Sig Dispense Refill  . oxyCODONE-acetaminophen (ROXICET) 5-325 MG tablet Take 1-2 tablets by mouth every 4 (four) hours as needed. 50 tablet 0   No current facility-administered medications for this visit.    OBJECTIVE: Middle-aged African-American [name] in no acute distress Filed Vitals:   09/03/15 0929  BP: 143/92  Pulse: 83  Temp: 98.4 F (36.9 C)  Resp: 18     Body mass index is 21.56 kg/(m^2).    ECOG FS:0 - Asymptomatic  Sclerae unicteric, pupils round and equal Oropharynx clear and moist-- no thrush or other lesions No cervical or supraclavicular adenopathy Lungs no rales or rhonchi Heart regular rate and rhythm Abd soft, nontender, positive bowel sounds MSK no focal spinal tenderness, no upper extremity lymphedema Neuro: nonfocal, well oriented, appropriate affect Breasts: the right breast is unremarkable per the left breast is status post mastectomy. The incision has healed very nicely. There is no significant irregularity. There is no tenderness to palpation, swelling, or erythema. The left axilla is benign.   LAB RESULTS:  CMP     Component Value Date/Time   NA 143 07/02/2015 1850   NA 143 06/11/2015 0849   K 3.1* 07/02/2015 1850   K 3.7 06/11/2015 0849   CL 109 07/02/2015 0630   CO2 24 07/02/2015 0630   CO2 26 06/11/2015 0849   GLUCOSE 130* 07/02/2015 1850   GLUCOSE 122 06/11/2015 0849   BUN 10 07/02/2015 0630   BUN 15.6 06/11/2015 0849   CREATININE 0.91 07/02/2015 0630   CREATININE 0.9 06/11/2015 0849   CALCIUM 9.5 07/02/2015 0630   CALCIUM 10.5* 06/11/2015 0849   PROT 7.4 06/11/2015 0849   ALBUMIN 4.4 06/11/2015 0849   AST 16 06/11/2015 0849   ALT 17 06/11/2015 0849   ALKPHOS 94 06/11/2015 0849   BILITOT 0.37 06/11/2015 0849   GFRNONAA >60 07/02/2015 0630   GFRAA >60 07/02/2015 0630    INo results found for: SPEP, UPEP  Lab Results  Component Value Date    WBC 11.3* 07/03/2015   NEUTROABS 6.4 07/03/2015   HGB 8.3* 07/03/2015   HCT 24.3* 07/03/2015   MCV 87.4 07/03/2015   PLT 110* 07/03/2015      Chemistry      Component Value Date/Time   NA 143 07/02/2015 1850   NA 143 06/11/2015 0849   K 3.1* 07/02/2015 1850   K 3.7 06/11/2015 0849   CL 109 07/02/2015 0630   CO2 24 07/02/2015 0630   CO2 26 06/11/2015 0849   BUN 10 07/02/2015 0630   BUN 15.6 06/11/2015 0849   CREATININE 0.91 07/02/2015 0630   CREATININE 0.9 06/11/2015 0849      Component Value Date/Time   CALCIUM 9.5 07/02/2015 0630   CALCIUM 10.5* 06/11/2015 0849   ALKPHOS 94 06/11/2015 0849   AST 16 06/11/2015 0849   ALT 17 06/11/2015 0849   BILITOT 0.37 06/11/2015 0849       No results found for: LABCA2  No components found for: [id number]  No results for input(s): INR in the last 168 hours.  Urinalysis No results found for: COLORURINE, APPEARANCEUR, LABSPEC, PHURINE, GLUCOSEU, HGBUR, BILIRUBINUR, KETONESUR, PROTEINUR, UROBILINOGEN, NITRITE, LEUKOCYTESUR  STUDIES: No results found.  ASSESSMENT: 55 y.o. Catherine [name] status post left breast biopsy 06/05/2015 for a clinical T2 NX, stage II or 3 invasive ductal carcinoma, estrogen and progesterone receptor positive, with an MIB-1 of 20%, and equivocal Catherine-2 amplification  (a) the patient refused biopsy of a suspicious left axillary lymph node  (1) status post left mastectomy and sentinel lymph node sampling 07/01/2015 for a pT3 pN0, stage IIB invasive ductal carcinoma, grade 2, estrogen and progesterone receptor positive, Catherine-2 not amplified, with an MIB-1 of 30%.  (2) Oncotype DX score of 16 predicts a risk of recurrence outside the breast within 10 years of 10% if the patient's only systemic therapy is tamoxifen for 5 years. It also predicts no significant benefit from chemotherapy.  (3) adjuvant radiation to follow   (4) tamoxifen started 09/03/2015  (5) bipolar disorder: The patient refuses medication at  this time  (6) tobacco abuse disorder: The patient has been strongly advised to quit  PLAN: I reviewed the data with Catherine [name] and Catherine [name]. She understands that for tumors as large as hers, even though node-negative, there remains a significant risk of local recurrence. For that reason we recommend radiation.  She is very reluctant to proceed to radiation. She is concerned about side effects but perhaps more importantly she has no transportation. I suggested that we could help Catherine with the  transportation but even with that she is unsure whether she wishes to proceed to radiation or not. She did agree to discuss it further and I have made Catherine an appointment with one of the radiation oncologists.  We are going ahead and starting tamoxifen now. We discussed the possible toxicities, side effects and complications and that information was given to Catherine in writing. I went ahead and placed the prescription as well. We are going to see Catherine again in approximately 3 weeks just to make sure she is tolerating it well. If she has difficulty with tamoxifen and we will likely switch to fulvestrant.  She is very upset at what she understands to have been suboptimal treatment at [facility] and wishes to write a complaint. We will inform Catherine how best to do that and she will receive that information through our navigator, Catherine [name], who keeps closely in touch with [place].  The patient has a good understanding of the overall plan. She agrees with it. She knows the goal of treatment in Catherine case is cure. She will call with any problems that may develop before Catherine next visit here.  Chauncey Cruel, MD   09/03/2015 9:50 AM Medical Oncology and Hematology [facility] [address] [place], [place] [address] Tel. [phone]    Fax. [phone]

## 2015-09-03 NOTE — Telephone Encounter (Signed)
Advised dr Doris Cheadle that he did not have anything available in 3-4 weeks and he advised heather in the pm which the patient states that she cannot do that at all due to child care/school,will send dr Jana Hakim a note if her intended for her to have a rad on referrall

## 2015-09-04 ENCOUNTER — Ambulatory Visit (HOSPITAL_BASED_OUTPATIENT_CLINIC_OR_DEPARTMENT_OTHER): Payer: Medicaid Other | Admitting: Anesthesiology

## 2015-09-04 ENCOUNTER — Encounter (HOSPITAL_BASED_OUTPATIENT_CLINIC_OR_DEPARTMENT_OTHER): Payer: Self-pay | Admitting: Anesthesiology

## 2015-09-04 ENCOUNTER — Ambulatory Visit (HOSPITAL_BASED_OUTPATIENT_CLINIC_OR_DEPARTMENT_OTHER)
Admission: RE | Admit: 2015-09-04 | Discharge: 2015-09-04 | Disposition: A | Discharge status: Home / Self Care | Payer: Medicaid Other | Source: Ambulatory Visit | Attending: General Surgery | Admitting: General Surgery

## 2015-09-04 ENCOUNTER — Encounter (HOSPITAL_BASED_OUTPATIENT_CLINIC_OR_DEPARTMENT_OTHER)
Admission: RE | Disposition: A | Discharge status: Home / Self Care | Payer: Self-pay | Source: Ambulatory Visit | Attending: General Surgery

## 2015-09-04 DIAGNOSIS — F319 Bipolar disorder, unspecified: Secondary | ICD-10-CM | POA: Insufficient documentation

## 2015-09-04 DIAGNOSIS — F17210 Nicotine dependence, cigarettes, uncomplicated: Secondary | ICD-10-CM | POA: Insufficient documentation

## 2015-09-04 DIAGNOSIS — Z452 Encounter for adjustment and management of vascular access device: Secondary | ICD-10-CM | POA: Insufficient documentation

## 2015-09-04 DIAGNOSIS — F419 Anxiety disorder, unspecified: Secondary | ICD-10-CM | POA: Diagnosis not present

## 2015-09-04 HISTORY — PX: PORT-A-CATH REMOVAL: SHX5289

## 2015-09-04 SURGERY — REMOVAL PORT-A-CATH
Anesthesia: General | Site: Chest | Laterality: Right

## 2015-09-04 MED ORDER — DEXAMETHASONE SODIUM PHOSPHATE 10 MG/ML IJ SOLN
INTRAMUSCULAR | Status: AC
Start: 2015-09-04 — End: 2015-09-04
  Filled 2015-09-04: qty 1

## 2015-09-04 MED ORDER — ACETAMINOPHEN 650 MG RE SUPP: 650.0000 mg | RECTAL | Status: DC | PRN

## 2015-09-04 MED ORDER — SODIUM CHLORIDE 0.9% FLUSH: 3.0000 mL | INTRAVENOUS | Status: DC | PRN

## 2015-09-04 MED ORDER — FENTANYL CITRATE (PF) 100 MCG/2ML IJ SOLN
50.0000 ug | INTRAMUSCULAR | Status: DC | PRN
Start: 2015-09-04 — End: 2015-09-04
  Administered 2015-09-04: 100 ug via INTRAVENOUS

## 2015-09-04 MED ORDER — LIDOCAINE HCL (CARDIAC) 20 MG/ML IV SOLN
INTRAVENOUS | Status: DC | PRN
  Administered 2015-09-04: 50 mg via INTRAVENOUS

## 2015-09-04 MED ORDER — SODIUM CHLORIDE 0.9% FLUSH: 3.0000 mL | Freq: Two times a day (BID) | INTRAVENOUS | Status: DC

## 2015-09-04 MED ORDER — FENTANYL CITRATE (PF) 100 MCG/2ML IJ SOLN: 25.0000 ug | INTRAMUSCULAR | Status: DC | PRN

## 2015-09-04 MED ORDER — PROPOFOL 10 MG/ML IV BOLUS
INTRAVENOUS | Status: DC | PRN
  Administered 2015-09-04: 200 mg via INTRAVENOUS

## 2015-09-04 MED ORDER — DEXAMETHASONE SODIUM PHOSPHATE 4 MG/ML IJ SOLN
INTRAMUSCULAR | Status: DC | PRN
  Administered 2015-09-04: 10 mg via INTRAVENOUS

## 2015-09-04 MED ORDER — PROPOFOL 500 MG/50ML IV EMUL
INTRAVENOUS | Status: AC
Start: 2015-09-04 — End: 2015-09-04
  Filled 2015-09-04: qty 50

## 2015-09-04 MED ORDER — SODIUM CHLORIDE 0.9 % IJ SOLN
INTRAMUSCULAR | Status: AC
  Filled 2015-09-04: qty 10

## 2015-09-04 MED ORDER — METOCLOPRAMIDE HCL 5 MG/ML IJ SOLN: 10.0000 mg | Freq: Once | INTRAMUSCULAR | Status: DC | PRN

## 2015-09-04 MED ORDER — FENTANYL CITRATE (PF) 100 MCG/2ML IJ SOLN
INTRAMUSCULAR | Status: AC
  Filled 2015-09-04: qty 2

## 2015-09-04 MED ORDER — MIDAZOLAM HCL 2 MG/2ML IJ SOLN
INTRAMUSCULAR | Status: AC
  Filled 2015-09-04: qty 2

## 2015-09-04 MED ORDER — HYDROCODONE-ACETAMINOPHEN 10-325 MG PO TABS: 1.0000 | ORAL_TABLET | Freq: Four times a day (QID) | ORAL | Status: DC | PRN

## 2015-09-04 MED ORDER — MEPERIDINE HCL 25 MG/ML IJ SOLN: 6.2500 mg | INTRAMUSCULAR | Status: DC | PRN

## 2015-09-04 MED ORDER — ONDANSETRON HCL 4 MG/2ML IJ SOLN
INTRAMUSCULAR | Status: DC | PRN
  Administered 2015-09-04: 4 mg via INTRAVENOUS

## 2015-09-04 MED ORDER — ACETAMINOPHEN 325 MG PO TABS: 650.0000 mg | ORAL_TABLET | ORAL | Status: DC | PRN

## 2015-09-04 MED ORDER — LIDOCAINE HCL (CARDIAC) 20 MG/ML IV SOLN
INTRAVENOUS | Status: AC
  Filled 2015-09-04: qty 5

## 2015-09-04 MED ORDER — LACTATED RINGERS IV SOLN
INTRAVENOUS | Status: DC
  Administered 2015-09-04: 10:00:00 via INTRAVENOUS

## 2015-09-04 MED ORDER — GLYCOPYRROLATE 0.2 MG/ML IJ SOLN: 0.2000 mg | Freq: Once | INTRAMUSCULAR | Status: DC | PRN

## 2015-09-04 MED ORDER — ONDANSETRON HCL 4 MG/2ML IJ SOLN
INTRAMUSCULAR | Status: AC
  Filled 2015-09-04: qty 2

## 2015-09-04 MED ORDER — OXYCODONE HCL 5 MG PO TABS: 5.0000 mg | ORAL_TABLET | ORAL | Status: DC | PRN

## 2015-09-04 MED ORDER — SODIUM CHLORIDE 0.9 % IV SOLN: 250.0000 mL | INTRAVENOUS | Status: DC | PRN

## 2015-09-04 MED ORDER — CEFAZOLIN SODIUM 1 G IJ SOLR
INTRAMUSCULAR | Status: AC
  Filled 2015-09-04: qty 20

## 2015-09-04 MED ORDER — MIDAZOLAM HCL 2 MG/2ML IJ SOLN
1.0000 mg | INTRAMUSCULAR | Status: DC | PRN
  Administered 2015-09-04: 2 mg via INTRAVENOUS

## 2015-09-04 MED ORDER — SCOPOLAMINE 1 MG/3DAYS TD PT72: 1.0000 | MEDICATED_PATCH | Freq: Once | TRANSDERMAL | Status: DC | PRN

## 2015-09-04 MED ORDER — CEFAZOLIN SODIUM-DEXTROSE 2-3 GM-% IV SOLR
2.0000 g | INTRAVENOUS | Status: AC
  Administered 2015-09-04: 2 g via INTRAVENOUS

## 2015-09-04 MED ORDER — LIDOCAINE-EPINEPHRINE (PF) 1 %-1:200000 IJ SOLN
INTRAMUSCULAR | Status: DC | PRN
  Administered 2015-09-04: 10 mL via INTRAMUSCULAR

## 2015-09-04 MED ORDER — HYDROCODONE-ACETAMINOPHEN 7.5-325 MG PO TABS: 1.0000 | ORAL_TABLET | Freq: Once | ORAL | Status: DC | PRN

## 2015-09-04 SURGICAL SUPPLY — 40 items
BLADE HEX COATED 2.75 (ELECTRODE) ×3 IMPLANT
BLADE SURG 15 STRL LF DISP TIS (BLADE) ×1 IMPLANT
BLADE SURG 15 STRL SS (BLADE) ×3
CANISTER SUCT 1200ML W/VALVE (MISCELLANEOUS) IMPLANT
CHLORAPREP W/TINT 26ML (MISCELLANEOUS) ×3 IMPLANT
COVER BACK TABLE 60X90IN (DRAPES) ×3 IMPLANT
COVER MAYO STAND STRL (DRAPES) ×3 IMPLANT
DECANTER SPIKE VIAL GLASS SM (MISCELLANEOUS) IMPLANT
DRAPE LAPAROTOMY 100X72 PEDS (DRAPES) ×3 IMPLANT
DRAPE UTILITY XL STRL (DRAPES) ×3 IMPLANT
ELECT REM PT RETURN 9FT ADLT (ELECTROSURGICAL) ×3
ELECTRODE REM PT RTRN 9FT ADLT (ELECTROSURGICAL) ×1 IMPLANT
GLOVE BIO SURGEON STRL SZ 6 (GLOVE) ×3 IMPLANT
GLOVE BIOGEL PI IND STRL 6.5 (GLOVE) ×1 IMPLANT
GLOVE BIOGEL PI IND STRL 8 (GLOVE) IMPLANT
GLOVE BIOGEL PI INDICATOR 6.5 (GLOVE) ×4
GLOVE BIOGEL PI INDICATOR 8 (GLOVE) ×2
GLOVE SURG SS PI 6.0 STRL IVOR (GLOVE) ×2 IMPLANT
GLOVE SURG SS PI 8.5 STRL IVOR (GLOVE) ×2
GLOVE SURG SS PI 8.5 STRL STRW (GLOVE) IMPLANT
GOWN STRL REUS W/ TWL LRG LVL3 (GOWN DISPOSABLE) ×1 IMPLANT
GOWN STRL REUS W/ TWL XL LVL3 (GOWN DISPOSABLE) IMPLANT
GOWN STRL REUS W/TWL 2XL LVL3 (GOWN DISPOSABLE) ×3 IMPLANT
GOWN STRL REUS W/TWL LRG LVL3 (GOWN DISPOSABLE) ×3
GOWN STRL REUS W/TWL XL LVL3 (GOWN DISPOSABLE) ×3
LIQUID BAND (GAUZE/BANDAGES/DRESSINGS) ×3 IMPLANT
NDL HYPO 25X1 1.5 SAFETY (NEEDLE) ×1 IMPLANT
NEEDLE HYPO 25X1 1.5 SAFETY (NEEDLE) ×3 IMPLANT
NS IRRIG 1000ML POUR BTL (IV SOLUTION) ×2 IMPLANT
PACK BASIN DAY SURGERY FS (CUSTOM PROCEDURE TRAY) ×3 IMPLANT
PENCIL BUTTON HOLSTER BLD 10FT (ELECTRODE) ×3 IMPLANT
SUT MNCRL AB 4-0 PS2 18 (SUTURE) ×3 IMPLANT
SUT VIC AB 3-0 SH 27 (SUTURE) ×3
SUT VIC AB 3-0 SH 27X BRD (SUTURE) ×1 IMPLANT
SYR CONTROL 10ML LL (SYRINGE) ×3 IMPLANT
TOWEL OR 17X24 6PK STRL BLUE (TOWEL DISPOSABLE) ×3 IMPLANT
TOWEL OR NON WOVEN STRL DISP B (DISPOSABLE) ×1 IMPLANT
TUBE CONNECTING 20'X1/4 (TUBING)
TUBE CONNECTING 20X1/4 (TUBING) IMPLANT
YANKAUER SUCT BULB TIP NO VENT (SUCTIONS) IMPLANT

## 2015-09-04 NOTE — H&P (Signed)
  Catherine [name] Location: [facility] Patient #: [id number] DOB: [birth year] Undefined / Language: Catherine [name] / Race: Black or African American Female   History of Present Illness Patient words: left breast cancer.  The patient is a 55 year old female who presents with breast cancer. Prior history [Pt is a 55 yo F referred by Dr. Amil Amen for consultation for a new diagnosis of a left breast cancer. The patient presented with a palpable mass on the left. She underwent diagnostic imaging that showed a 4 cm mass in the upper inner quadrant with nipple retraction. She also has a suspicious axillary lymph node. She was extraordinarily anxious and refused biopsy of the left axillary lymph node. She is very overwhelmed with her diagnosis and is refusing physical examination of her breast. She does not have any known family history of cancer. 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 ER/PR staining was positive. Her 2 was equivocal and has been reflexed to IHC. Ki 67 was 20%. dx mammogram shows 4 cm irregular high density mass wtih spiculated margin and calcifications in the upper inner quadrant. Architectural distortion and nipple retraction is present. She is accompanied by her adult son and her father. her parents are alive in their 81s. She smokes 1 ppd cigarettes and drinks beer occasionally. Menarche was at age 65. She is post menopausal. she has 2 children, with the first at age 37. She has not had a colonoscopy or bone density study. she has not had a Pap smear for quite a while. History of bipolar reported, but patient not on medications. Son and father are unsure of this diagnosis. )  Pt underwent left mastectomy/SLN bx/port a cath. This was complicated by post op bleed. Her pathology is pT3N0.   Patient ended up having negative lymph nodes and low oncotype despite preoperative imaging and size of tumor, so she does not  need chemotherapy.    Allergies  No Known Drug Allergies11/29/2016  Medication History Tylenol (325MG  Tablet, Oral prn) Active. Medications Reconciled    Review of Systems  All other systems negative  Vitals Weight: 135.4 lb Height: 66in Body Surface Area: 1.69 m Body Mass Index: 21.85 kg/m  Temp.: 98.46F(Oral)  Pulse: 72 (Regular)  BP: 118/72 (Sitting, Left Arm, Standard)       Physical Exam General Mental Status-Alert. General Appearance-Consistent with stated age. Hydration-Well hydrated. Voice-Normal.  Chest and Lung Exam Note: no erythema or drainage. Drain output serosang. Staples removed.     Assessment & Plan  BREAST CANCER OF UPPER-INNER QUADRANT OF LEFT FEMALE BREAST (C50.212) Impression:  Will plan port removal. Risks and benefits addressed.    Follow up in around 3 months. Current Plans Follow up in 3 months or as needed Pt Education - [facility] Free Text Education/Instructions: discussed with patient and provided information.   Signed by [name], MD

## 2015-09-04 NOTE — Anesthesia Postprocedure Evaluation (Signed)
Anesthesia Post Note  Patient: Catherine [name]  Procedure(s) Performed: Procedure(s) (LRB): REMOVAL PORT-A-CATH (Right)  Patient location during evaluation: PACU Anesthesia Type: General Level of consciousness: awake and alert and oriented Pain management: pain level controlled Vital Signs Assessment: post-procedure vital signs reviewed and stable Respiratory status: spontaneous breathing, nonlabored ventilation, respiratory function stable and patient connected to nasal cannula oxygen Cardiovascular status: blood pressure returned to baseline and stable Postop Assessment: no signs of nausea or vomiting Anesthetic complications: no    Last Vitals:  Filed Vitals:   09/04/15 1120 09/04/15 1130  BP:    Pulse: 86 82  Temp: 36.4 C   Resp: 20 12    Last Pain:  Filed Vitals:   09/04/15 1132  PainSc: Asleep                 [name] A.

## 2015-09-04 NOTE — Anesthesia Procedure Notes (Signed)
Procedure Name: LMA Insertion Date/Time: 09/04/2015 10:45 AM Performed by: Lieutenant Diego Pre-anesthesia Checklist: Patient identified, Emergency Drugs available, Suction available and Patient being monitored Patient Re-evaluated:Patient Re-evaluated prior to inductionOxygen Delivery Method: Circle System Utilized Preoxygenation: Pre-oxygenation with 100% oxygen Intubation Type: IV induction Ventilation: Mask ventilation without difficulty LMA: LMA inserted LMA Size: 4.0 Number of attempts: 1 Airway Equipment and Method: Bite block Placement Confirmation: positive ETCO2 and breath sounds checked- equal and bilateral Tube secured with: Tape Dental Injury: Teeth and Oropharynx as per pre-operative assessment

## 2015-09-04 NOTE — Transfer of Care (Signed)
Immediate Anesthesia Transfer of Care Note  Patient: Catherine [name]  Procedure(s) Performed: Procedure(s): REMOVAL PORT-A-CATH (Right)  Patient Location: PACU  Anesthesia Type:General  Level of Consciousness: sedated  Airway & Oxygen Therapy: Patient Spontanous Breathing and Patient connected to face mask oxygen  Post-op Assessment: Report given to RN and Post -op Vital signs reviewed and stable  Post vital signs: Reviewed and stable  Last Vitals:  Filed Vitals:   09/04/15 0919 09/04/15 1120  BP: 134/97   Pulse: 102 86  Temp: 36.7 C   Resp: 18     Complications: No apparent anesthesia complications

## 2015-09-04 NOTE — Interval H&P Note (Signed)
History and Physical Interval Note:  09/04/2015 9:13 AM  Catherine [name]  has presented today for surgery, with the diagnosis of BREAST CANCER  The various methods of treatment have been discussed with the patient and family. After consideration of risks, benefits and other options for treatment, the patient has consented to  Procedure(s): REMOVAL PORT-A-CATH (N/A) as a surgical intervention .  The patient's history has been reviewed, patient examined, no change in status, stable for surgery.  I have reviewed the patient's chart and labs.  Questions were answered to the patient's satisfaction.     Shaundra Fullam

## 2015-09-04 NOTE — Anesthesia Preprocedure Evaluation (Addendum)
Anesthesia Evaluation  Patient identified by MRN, date of birth, ID band Patient awake    Reviewed: Allergy & Precautions, NPO status , Patient's Chart, lab work & pertinent test results  Airway Mallampati: I  TM Distance: >3 FB Neck ROM: Full    Dental no notable dental hx. (+) Teeth Intact   Pulmonary Current Smoker,    Pulmonary exam normal breath sounds clear to auscultation + wheezing      Cardiovascular Normal cardiovascular exam Rhythm:Regular Rate:Normal  Indwelling Portacath   Neuro/Psych PSYCHIATRIC DISORDERS Anxiety Depression Bipolar Disorder negative neurological ROS     GI/Hepatic negative GI ROS,   Endo/Other  Hyperlipidemia Left breast Ca  Renal/GU negative Renal ROS  negative genitourinary   Musculoskeletal negative musculoskeletal ROS (+)   Abdominal (+) + obese,   Peds  Hematology   Anesthesia Other Findings   Reproductive/Obstetrics negative OB ROS                            Anesthesia Physical Anesthesia Plan  ASA: II  Anesthesia Plan: General   Post-op Pain Management:    Induction: Intravenous  Airway Management Planned: LMA  Additional Equipment:   Intra-op Plan:   Post-operative Plan: Extubation in OR  Informed Consent: I have reviewed the patients History and Physical, chart, labs and discussed the procedure including the risks, benefits and alternatives for the proposed anesthesia with the patient or authorized representative who has indicated his/her understanding and acceptance.   Dental advisory given  Plan Discussed with: Anesthesiologist, CRNA and Surgeon  Anesthesia Plan Comments:         Anesthesia Quick Evaluation

## 2015-09-04 NOTE — Discharge Instructions (Addendum)
[facility] Office Phone Number [phone]   POST OP INSTRUCTIONS  Always review your discharge instruction sheet given to you by the facility where your surgery was performed.  IF YOU HAVE DISABILITY OR FAMILY LEAVE FORMS, YOU MUST BRING THEM TO THE OFFICE FOR PROCESSING.  DO NOT GIVE THEM TO YOUR DOCTOR.  1. A prescription for pain medication may be given to you upon discharge.  Take your pain medication as prescribed, if needed.  If narcotic pain medicine is not needed, then you may take acetaminophen (Tylenol) or ibuprofen (Advil) as needed. 2. Take your usually prescribed medications unless otherwise directed 3. If you need a refill on your pain medication, please contact your pharmacy.  They will contact our office to request authorization.  Prescriptions will not be filled after 5pm or on week-ends. 4. You should eat very light the first 24 hours after surgery, such as soup, crackers, pudding, etc.  Resume your normal diet the day after surgery 5. It is common to experience some constipation if taking pain medication after surgery.  Increasing fluid intake and taking a stool softener will usually help or prevent this problem from occurring.  A mild laxative (Milk of Magnesia or Miralax) should be taken according to package directions if there are no bowel movements after 48 hours. 6. You may shower in 48 hours.  The surgical glue will flake off in 2-3 weeks.   7. ACTIVITIES:  No strenuous activity or heavy lifting for 1 week.   a. You may drive when you no longer are taking prescription pain medication, you can comfortably wear a seatbelt, and you can safely maneuver your car and apply brakes. b. RETURN TO WORK:  __________1-2 weeks_______________ Catherine [name] should see your doctor in the office for a follow-up appointment approximately three-four weeks after your surgery.    WHEN TO CALL YOUR DOCTOR: 1. Fever over 101.0 2. Nausea and/or vomiting. 3. Extreme swelling or  bruising. 4. Continued bleeding from incision. 5. Increased pain, redness, or drainage from the incision.  The clinic staff is available to answer your questions during regular business hours.  Please dont hesitate to call and ask to speak to one of the nurses for clinical concerns.  If you have a medical emergency, go to the nearest emergency room or call 911.  A surgeon from [facility] is always on call at the hospital.  For further questions, please visit [email or web address]      Post Anesthesia Home Care Instructions  Activity: Get plenty of rest for the remainder of the day. A responsible adult should stay with you for 24 hours following the procedure.  For the next 24 hours, DO NOT: -Drive a car -Paediatric nurse -Drink alcoholic beverages -Take any medication unless instructed by your physician -Make any legal decisions or sign important papers.  Meals: Start with liquid foods such as gelatin or soup. Progress to regular foods as tolerated. Avoid greasy, spicy, heavy foods. If nausea and/or vomiting occur, drink only clear liquids until the nausea and/or vomiting subsides. Call your physician if vomiting continues.  Special Instructions/Symptoms: Your throat may feel dry or sore from the anesthesia or the breathing tube placed in your throat during surgery. If this causes discomfort, gargle with warm salt water. The discomfort should disappear within 24 hours.  If you had a scopolamine patch placed behind your ear for the management of post- operative nausea and/or vomiting:  1. The medication in the patch is effective for 72 hours, after which  it should be removed.  Wrap patch in a tissue and discard in the trash. Wash hands thoroughly with soap and water. 2. You may remove the patch earlier than 72 hours if you experience unpleasant side effects which may include dry mouth, dizziness or visual disturbances. 3. Avoid touching the patch. Wash your hands  with soap and water after contact with the patch.

## 2015-09-04 NOTE — Op Note (Signed)
  PRE-OPERATIVE DIAGNOSIS:  un-needed Port-A-Cath for left  POST-OPERATIVE DIAGNOSIS:  Same   PROCEDURE:  Procedure(s):  REMOVAL PORT-A-CATH  SURGEON:  Surgeon(s):  [name], MD  ANESTHESIA:   General + local  EBL:   Minimal  SPECIMEN:  None  Complications : none known  Procedure:   Pt was  identified in the holding area and taken to the operating room where she was placed supine on the operating room table.  General anesthesia was induced.  The right upper chest was prepped and draped.  The prior incision was anesthetized with local anesthetic.  The incision was opened with a #15 blade.  The subcutaneous tissue was divided with the cautery.  The port was identified and the capsule opened.  The four 2-0 prolene sutures were removed.  The port was then removed and pressure held on the tract.  The catheter appeared intact without evidence of breakage, length was 17.5.  The wound was inspected for hemostasis, which was achieved with cautery.  The wound was closed with 3-0 vicryl deep dermal interrupted sutures and 4-0 Monocryl running subcuticular suture.  The wound was cleaned, dried, and dressed with dermabond.  The patient was awakened from anesthesia and taken to the PACU in stable condition.  Needle, sponge, and instrument counts are correct.

## 2015-09-05 ENCOUNTER — Encounter (HOSPITAL_BASED_OUTPATIENT_CLINIC_OR_DEPARTMENT_OTHER): Payer: Self-pay | Admitting: General Surgery

## 2015-09-05 ENCOUNTER — Encounter: Payer: Self-pay | Admitting: *Deleted

## 2015-09-05 LAB — URINE CYTOLOGY ANCILLARY ONLY
Bacterial vaginitis: POSITIVE — AB
CANDIDA VAGINITIS: NEGATIVE
Chlamydia: NEGATIVE
Neisseria Gonorrhea: NEGATIVE
TRICH (WINDOWPATH): NEGATIVE

## 2015-09-05 NOTE — Progress Notes (Signed)
Spoke to pt concerning future appts for xrt. Reached out to [facility] and SW for financial assistance and transportation assistance.

## 2015-09-11 ENCOUNTER — Ambulatory Visit: Payer: Medicaid Other

## 2015-09-11 ENCOUNTER — Ambulatory Visit: Payer: Medicaid Other | Admitting: Radiation Oncology

## 2015-09-11 NOTE — Progress Notes (Signed)
Radiation Oncology         [phone]) [phone] ________________________________  Initial Outpatient Consultation - Date: 09/12/2015   Name: Catherine [name] MRN: [id number]   DOB: [birth year]  REFERRING PHYSICIAN: [name], MD  DIAGNOSIS AND STAGE: Breast cancer of upper-inner quadrant of left female breast [facility])   Staging form: Breast, AJCC 7th Edition     Clinical: Stage IIB (T2, N1, M0) - Signed by Chauncey Cruel, MD on 06/11/2015  HISTORY OF PRESENT ILLNESS::Catherine [name] is a 55 y.o. female who noticed some skin dimpling of the left breast and brought it to the attention of Dr. Criss Rosales. On 05/28/2015 she underwent bilateral diagnostic mammography with tomosynthesis and left breast ultrasonography at [facility]. The most recent mammogram prior to this was 2009. The breast density was category C. In the upper inner quadrant of the left breast was a 4 cm irregular mass associated with architectural distortion and nipple retraction. On ultrasound this measured 4 cm and had micro-lobulated margins. There was a lymph node in the left axilla with a focus of cortical thickening.  On 06/05/2015 the patient underwent biopsy of the breast mass in question. This showed a grade 2-3 invasive ductal carcinoma, estrogen receptor 95% positive, progesterone receptor 40% positive, both with strong staining intensity, with an MIB-1 of 20%, and HER-2 equivocal for amplification, the signals ratio being 1.43 but the number per cell 4.38. Additional studies were pending to clarify the HER-2 status. Biopsy of the abnormal lymph node in the left axilla was also scheduled for 06/05/2015 but the patient refused.  She underwent left mastectomy with sentinel lymph node sampling on 07/01/15. She elected not to pursue immediate reconstruction. This found a 6.7 cm invasive ductal carcinoma, grade 2, with ample margins. All 4 sentinel lymph nodes removed were negative. Prognostic panel showed the tumor to be estrogen receptor  positive at 100%, progesterone receptor positive at 20%, both with strong staining intensity, with an MIB-1 of 30%, and HER-2 negative, the signals ratio being 1.25 and the number per cell 3.30.  Oncotype DX on this tumor showed a score of 16, predicting a risk of outside the breast recurrence of 10% within 10 years if her only systemic therapy is tamoxifen for 5 years. Therefore, chemotherapy was not recommened. Dr. Jana Hakim started her on Tamoxifen on 09/03/15. The patient presents to me today to discuss my consideration of radiotherapy for the management of her disease.  She is alone.  Her main concern is her son who has autism and being available for him.  She has difficulties with transportation  PREVIOUS RADIATION THERAPY: No  Past medical, social and family history were reviewed in the electronic chart. Review of symptoms was reviewed in the electronic chart. Medications were reviewed in the electronic chart.   PHYSICAL EXAM: Quiet female. No distress. Well healed chest wall. No lymphedema.   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  PLAN: I spoke to the patient today regarding her diagnosis and options for treatment. We discussed the equivalence in terms of survival and local failure between mastectomy and breast conservation. We discussed the role of radiation in decreasing local failures in patients who undergo mastectomy and have risk factors for recurrence including positive lymph nodes and/or tumors over 5 cm and/or positive margins. We discussed the process of simulation and the placement tattoos. We discussed 28 treatments as an outpatient. We discussed the possibility of asymptomatic lung damage. We discussed the low likelihood of secondary malignancies. We discussed  the possible side effects including but not limited to skin redness, fatigue, permanent skin darkening, and chest wall swelling. We discussed increased complications that can occur with  reconstruction after radiation. We discussed the use of cardiac sparing with deep inspiration breath hold if needed.   I spent 40 minutes  face to face with the patient and more than 50% of that time was spent in counseling and/or coordination of care.   ------------------------------------------------  [name], MD  This document serves as a record of services personally performed by [name], MD. It was created on her behalf by Darcus Austin, a trained medical scribe. The creation of this record is based on the scribe's personal observations and the provider's statements to them. This document has been checked and approved by the attending provider.

## 2015-09-12 ENCOUNTER — Encounter: Payer: Self-pay | Admitting: *Deleted

## 2015-09-12 ENCOUNTER — Ambulatory Visit
Admission: RE | Admit: 2015-09-12 | Discharge: 2015-09-12 | Disposition: A | Discharge status: Home / Self Care | Payer: Medicaid Other | Source: Ambulatory Visit | Attending: Radiation Oncology | Admitting: Radiation Oncology

## 2015-09-12 VITALS — BP 133/89 | HR 88 | Temp 98.3°F | Ht 66.0 in | Wt 131.9 lb

## 2015-09-12 DIAGNOSIS — Z510 Encounter for antineoplastic radiation therapy: Secondary | ICD-10-CM | POA: Insufficient documentation

## 2015-09-12 DIAGNOSIS — Z9012 Acquired absence of left breast and nipple: Secondary | ICD-10-CM | POA: Insufficient documentation

## 2015-09-12 DIAGNOSIS — C50212 Malignant neoplasm of upper-inner quadrant of left female breast: Secondary | ICD-10-CM

## 2015-09-12 NOTE — Progress Notes (Signed)
[place] Work  Clinical Social Work was referred by pt for assessment of psychosocial needs due to financial concerns.  Clinical Social Work team has spoken with pt several times to offer support and assess for needs.  Pt eager for financial assistance and wants to "apply for all the grants". Pt reports she has an appt with Ailene Ravel, financial counselor to apply for [employer] and other assistance after her radonc appt. Pt states she has not worked since 2006, so this limits the additional assistance programs for breast cancer patients. CSW explained this to pt and she appeared to understand the limitations of programs.   CSW reviewed pt's chart and it appears pt has been diagnosed with bipolar disorder in the past and is not currently on any medication for this or depression. Pt has medicaid, which provides for transportation to medical appointments. Pt could utilize [facility] as an option for appointments. CSW did not address transportation or mental health issues via phone as pt was quick to get off the phone this morning. CSW will attempt to meet with pt at her radonc appointment this afternoon    Clinical Social Work interventions: Resource education  Catherine [name], [place] Worker [place]  [place] Phone: [phone] Fax: [phone]

## 2015-09-12 NOTE — Progress Notes (Signed)
Location of Breast Cancer: Breast Cancer Of Upper-Inner Quadrant Of Left Breast  Histology per Pathology Report: 07-01-15 Diagnosis 1. Lymph node, sentinel, biopsy, Left axillary - ONE BENIGN LYMPH NODE (0/1). 2. Lymph node, sentinel, biopsy, Left axillary - ONE BENIGN LYMPH NODE (0/1). 3. Lymph node, sentinel, biopsy, Left axillary - ONE BENIGN LYMPH NODE (0/1). 4. Lymph node, sentinel, biopsy, Left axillary #4 - BENIGN FIBROADIPOSE TISSUE AND BENIGN BREAST TISSUE. - NO LYMPH NODE TISSUE OR MALIGNANCY. 5. Breast, simple mastectomy, Left - INVASIVE DUCTAL CARCINOMA, 6.7 CM. - HIGH GRADE DUCTAL CARCINOMA IN SITU. - MARGINS NOT INVOLVED. - SEE ONCOLOGY TABLE AND COMMENT. 6. Lymph node, sentinel, biopsy, Left axillary - ONE BENIGN LYMPH NODE (0/1). Microscopic Comment 5. BREAST, Receptor Status: ER(100%), PR (20%), Her2-neu (), Ki-67(20%)   ER(95%),PR(40%),Ki-67(20%)  Mrs. Deeg presented with a palpable mass on the left upper inner quadrant with nipple retraction.  She also has a suspicious axillary lymph node.  Past/Anticipated interventions by surgeon, if any: Dr. Barry Dienes Left mastectomy/SLN,Bx./port-a-cath.  Past/Anticipated interventions by medical oncology, if KWI:OXBDZHGDJ started 09-03-15,Biopsy, Oncotype  Lymphedema issues, if any:  No  Pain issues, if any:  No  SAFETY ISSUES:  Prior radiation? No  Pacemaker/ICD? No  Possible current pregnancy No  Is the patient on methotrexate? No  Current Complaints / other details:  Reluctant to proceed to radiation;she is concerned about side effects and more importantly has no  Transportation.   Period started age 53,G 2, P 2 Menopause age 15 BP 133/89 mmHg  Pulse 88  Temp(Src) 98.3 F (36.8 C) (Oral)  Ht '5\' 6"'  (1.676 m)  Wt 131 lb 14.4 oz (59.829 kg)  BMI 21.30 kg/m2  SpO2 100%  LMP 06/30/2013 Georgena Spurling, RN 09/12/2015,12:47 PM

## 2015-09-12 NOTE — Progress Notes (Signed)
Catherine [name] Psychosocial Distress Screening Clinical Social Work  Clinical Social Work was referred by distress screening protocol.  The patient scored a 6 on the Psychosocial Distress Thermometer which indicates moderate distress. Clinical Education officer, museum following and pt declined to meet with CSW today to further assess for distress and other psychosocial needs.   ONCBCN DISTRESS SCREENING 09/12/2015  Screening Type Initial Screening  Distress experienced in past week (1-10) 6  Practical problem type Transportation  Emotional problem type Depression;Adjusting to illness  Physical Problem type Sleep/insomnia  Physician notified of physical symptoms Yes    Clinical Social Worker follow up needed: Yes.    If yes, follow up plan: CSW to continue to follow.   Loren Racer, [place] Worker [place]  [place] Phone: [phone] Fax: [phone]

## 2015-09-15 ENCOUNTER — Encounter: Payer: Self-pay | Admitting: *Deleted

## 2015-09-17 ENCOUNTER — Encounter: Payer: Self-pay | Admitting: *Deleted

## 2015-09-17 NOTE — Progress Notes (Signed)
[place] Work  Clinical Social Work was referred by CT sim for assessment of psychosocial needs due to possible transportation needs.  Clinical Social Worker contacted patient at home to offer support and assess for needs.  She can set up transportation with family per her report, just needs a time after 8am. She states she needs to get her son on the bus. She is NOT open to [employer] or city bus, so not a lot of options for her. CSW educated pt on [employer] as an option and she reports she will contact them. Pt has been approved for the [employer] and is also aware of the transportation program. She reports we just need to schedule a time and then she reports she will look for a ride. Pt awaiting call from [facility].   Clinical Social Work interventions: Building surveyor assistance       Loren Racer, [place] Social Worker [place]  [place] Phone: [phone] Fax: [phone]

## 2015-09-18 ENCOUNTER — Telehealth: Payer: Self-pay | Admitting: *Deleted

## 2015-09-18 NOTE — Telephone Encounter (Signed)
Pt called to cancel lab/appt with Heather. She has agreed to xrt.  Wishes to see Nira Conn after she has completed xrt. Relate she is doing well. Without complaints at this time. Discussed transportation. She will reach out to [facility] and use [employer] or a Taxi. She does not wish to use [facility]. Encourage pt to call with needs. Received verbal understanding.

## 2015-09-23 ENCOUNTER — Encounter: Payer: Self-pay | Admitting: *Deleted

## 2015-09-23 ENCOUNTER — Ambulatory Visit
Admission: RE | Admit: 2015-09-23 | Discharge: 2015-09-23 | Disposition: A | Discharge status: Home / Self Care | Payer: Medicaid Other | Source: Ambulatory Visit | Attending: Radiation Oncology | Admitting: Radiation Oncology

## 2015-09-23 DIAGNOSIS — C50212 Malignant neoplasm of upper-inner quadrant of left female breast: Secondary | ICD-10-CM | POA: Diagnosis not present

## 2015-09-23 NOTE — Progress Notes (Signed)
Radiation Oncology         [phone] ________________________________  Name: Catherine [name]      MRN: [id number]          Date: 09/23/2015              DOB: 02/12/1961  Optical Surface Tracking Plan:  Since intensity modulated radiotherapy (IMRT) and 3D conformal radiation treatment methods are predicated on accurate and precise positioning for treatment, intrafraction motion monitoring is medically necessary to ensure accurate and safe treatment delivery.  The ability to quantify intrafraction motion without excessive ionizing radiation dose can only be performed with optical surface tracking. Accordingly, surface imaging offers the opportunity to obtain 3D measurements of patient position throughout IMRT and 3D treatments without excessive radiation exposure.  I am ordering optical surface tracking for this patient's upcoming course of radiotherapy. ________________________________ Signature   Reference:   Ursula Alert, J, et al. Surface imaging-based analysis of intrafraction motion for breast radiotherapy patients.Journal of [place], n. 6, nov. 2014. ISSN GA:2306299.   Available at: <http://www.jacmp.org/index.php/jacmp/article/view/4957>.

## 2015-09-23 NOTE — Progress Notes (Signed)
Name: Catherine [name]   MRN: [id number]  Date:  09/23/2015  DOB: Aug 02, 1961  Status:outpatient    DIAGNOSIS: Breast cancer.  CONSENT VERIFIED: yes   SET UP: Patient is setup supine   IMMOBILIZATION:  The following immobilization was used:Custom Moldable Pillow, breast board.   NARRATIVE: [name] was brought to the [facility].  Identity was confirmed.  All relevant records and images related to the planned course of therapy were reviewed.  Then, the patient was positioned in a stable reproducible clinical set-up for radiation therapy.  Wires were placed to delineate the clinical extent of breast tissue. A wire was placed on the scar as well.  CT images were obtained.  An isocenter was placed. Skin markings were placed.  The CT images were loaded into the planning software where the target and avoidance structures were contoured.  The radiation prescription was entered and confirmed. The patient was discharged in stable condition and tolerated simulation well.    TREATMENT PLANNING NOTE:  Treatment planning then occurred. I have requested : MLC's, isodose plan, basic dose calculation  I personally designed and supervised the construction of 3 medically necessary complex treatment devices for the protection of critical normal structures including the lungs and contralateral breast as well as the immobilization device which is necessary for set up certainty.   3D simulation occurred. I requested and analyzed a dose volume histogram of the heart, lungs and lumpectomy cavity.

## 2015-09-25 ENCOUNTER — Ambulatory Visit: Payer: Medicaid Other | Admitting: Nurse Practitioner

## 2015-09-25 ENCOUNTER — Other Ambulatory Visit: Payer: Medicaid Other

## 2015-09-29 ENCOUNTER — Encounter: Payer: Self-pay | Admitting: *Deleted

## 2015-10-01 DIAGNOSIS — C50212 Malignant neoplasm of upper-inner quadrant of left female breast: Secondary | ICD-10-CM | POA: Diagnosis not present

## 2015-10-02 DIAGNOSIS — C50212 Malignant neoplasm of upper-inner quadrant of left female breast: Secondary | ICD-10-CM | POA: Diagnosis not present

## 2015-10-03 ENCOUNTER — Ambulatory Visit
Admission: RE | Admit: 2015-10-03 | Discharge: 2015-10-03 | Disposition: A | Discharge status: Home / Self Care | Payer: Medicaid Other | Source: Ambulatory Visit | Attending: Radiation Oncology | Admitting: Radiation Oncology

## 2015-10-03 DIAGNOSIS — C50212 Malignant neoplasm of upper-inner quadrant of left female breast: Secondary | ICD-10-CM | POA: Diagnosis not present

## 2015-10-06 ENCOUNTER — Ambulatory Visit
Admission: RE | Admit: 2015-10-06 | Discharge: 2015-10-06 | Disposition: A | Discharge status: Home / Self Care | Payer: Medicaid Other | Source: Ambulatory Visit | Attending: Radiation Oncology | Admitting: Radiation Oncology

## 2015-10-06 ENCOUNTER — Telehealth: Payer: Self-pay | Admitting: *Deleted

## 2015-10-06 DIAGNOSIS — C50212 Malignant neoplasm of upper-inner quadrant of left female breast: Secondary | ICD-10-CM | POA: Diagnosis not present

## 2015-10-06 NOTE — Telephone Encounter (Signed)
Left pt vm to return call to assess needs during xrt.

## 2015-10-07 ENCOUNTER — Encounter: Payer: Self-pay | Admitting: Radiation Oncology

## 2015-10-07 ENCOUNTER — Ambulatory Visit
Admission: RE | Admit: 2015-10-07 | Discharge: 2015-10-07 | Disposition: A | Discharge status: Home / Self Care | Payer: Medicaid Other | Source: Ambulatory Visit | Attending: Radiation Oncology | Admitting: Radiation Oncology

## 2015-10-07 ENCOUNTER — Encounter: Payer: Self-pay | Admitting: *Deleted

## 2015-10-07 VITALS — BP 141/81 | HR 84 | Temp 98.0°F | Ht 66.0 in | Wt 134.6 lb

## 2015-10-07 DIAGNOSIS — C50212 Malignant neoplasm of upper-inner quadrant of left female breast: Secondary | ICD-10-CM | POA: Diagnosis not present

## 2015-10-07 MED ORDER — RADIAPLEXRX EX GEL
Freq: Once | CUTANEOUS | Status: AC
  Administered 2015-10-07: 09:00:00 via TOPICAL

## 2015-10-07 MED ORDER — ALRA NON-METALLIC DEODORANT (RAD-ONC)
1.0000 "application " | Freq: Once | TOPICAL | Status: AC
  Administered 2015-10-07: 1 via TOPICAL

## 2015-10-07 NOTE — Progress Notes (Signed)
Catherine [name] has received r fractions to her left breast.  Appetite is good.  Denies pain to left breast has tenderness.  Education done today given Radiplex and Alra.  Skin to to left breast normal.  Energy level is decreases during the day. BP 141/81 mmHg  Pulse 84  Temp(Src) 98 F (36.7 C)  Ht 5\' 6"  (1.676 m)  Wt 134 lb 9.6 oz (61.054 kg)  BMI 21.74 kg/m2  SpO2 99%  LMP 06/30/2013

## 2015-10-07 NOTE — Progress Notes (Signed)
Weekly Management Note Current Dose: 3.6  Gy  Projected Dose: 50.4 Gy   Narrative:  The patient presents for routine under treatment assessment.  CBCT/MVCT images/Port film x-rays were reviewed.  The chart was checked. Pt refused to stay for PUT visit. Called on phone. Using radiaplex.  Physical Findings: Weight: 134 lb 9.6 oz (61.054 kg). Alert.   Impression:  The patient is tolerating radiation.  Plan:  Continue treatment as planned.

## 2015-10-08 ENCOUNTER — Ambulatory Visit
Admission: RE | Admit: 2015-10-08 | Discharge: 2015-10-08 | Disposition: A | Discharge status: Home / Self Care | Payer: Medicaid Other | Source: Ambulatory Visit | Attending: Radiation Oncology | Admitting: Radiation Oncology

## 2015-10-08 DIAGNOSIS — C50212 Malignant neoplasm of upper-inner quadrant of left female breast: Secondary | ICD-10-CM | POA: Diagnosis not present

## 2015-10-09 ENCOUNTER — Ambulatory Visit
Admission: RE | Admit: 2015-10-09 | Discharge: 2015-10-09 | Disposition: A | Discharge status: Home / Self Care | Payer: Medicaid Other | Source: Ambulatory Visit | Attending: Radiation Oncology | Admitting: Radiation Oncology

## 2015-10-09 DIAGNOSIS — C50212 Malignant neoplasm of upper-inner quadrant of left female breast: Secondary | ICD-10-CM | POA: Diagnosis not present

## 2015-10-10 ENCOUNTER — Ambulatory Visit
Admission: RE | Admit: 2015-10-10 | Discharge: 2015-10-10 | Disposition: A | Discharge status: Home / Self Care | Payer: Medicaid Other | Source: Ambulatory Visit | Attending: Radiation Oncology | Admitting: Radiation Oncology

## 2015-10-10 DIAGNOSIS — C50212 Malignant neoplasm of upper-inner quadrant of left female breast: Secondary | ICD-10-CM | POA: Diagnosis not present

## 2015-10-13 ENCOUNTER — Ambulatory Visit
Admission: RE | Admit: 2015-10-13 | Discharge: 2015-10-13 | Disposition: A | Discharge status: Home / Self Care | Payer: Medicaid Other | Source: Ambulatory Visit | Attending: Radiation Oncology | Admitting: Radiation Oncology

## 2015-10-13 ENCOUNTER — Encounter: Payer: Self-pay | Admitting: *Deleted

## 2015-10-13 DIAGNOSIS — C50212 Malignant neoplasm of upper-inner quadrant of left female breast: Secondary | ICD-10-CM

## 2015-10-13 NOTE — Progress Notes (Addendum)
Weekly Management Note    ICD-9-CM ICD-10-CM   1. Breast cancer of upper-inner quadrant of left female breast (HCC) 174.2 C50.212     Current Dose: 10.8  Gy  Projected Dose: 50.4 Gy   Narrative:  The patient presents for routine under treatment assessment.  CBCT/MVCT images/Port film x-rays were reviewed.  The chart was checked. Pt seen briefly at machine.  She refused to stay for vitals, and she walked away from me as I asked if she has any issues.  She did not state any complaints.  Physical Findings:  Alert.  Vitals not obtained  Would not stay for skin exam.  Impression:  The patient is tolerating radiation. Refuses to stay for a thorough examination.  Plan:  Continue treatment as planned. -----------------------------------  Eppie Gibson, MD

## 2015-10-14 ENCOUNTER — Ambulatory Visit: Payer: Medicaid Other

## 2015-10-14 ENCOUNTER — Ambulatory Visit
Admission: RE | Admit: 2015-10-14 | Discharge: 2015-10-14 | Disposition: A | Discharge status: Home / Self Care | Payer: Medicaid Other | Source: Ambulatory Visit | Attending: Radiation Oncology | Admitting: Radiation Oncology

## 2015-10-15 ENCOUNTER — Encounter: Payer: Self-pay | Admitting: *Deleted

## 2015-10-15 ENCOUNTER — Ambulatory Visit
Admission: RE | Admit: 2015-10-15 | Discharge: 2015-10-15 | Disposition: A | Discharge status: Home / Self Care | Payer: Medicaid Other | Source: Ambulatory Visit | Attending: Radiation Oncology | Admitting: Radiation Oncology

## 2015-10-15 DIAGNOSIS — C50212 Malignant neoplasm of upper-inner quadrant of left female breast: Secondary | ICD-10-CM | POA: Diagnosis not present

## 2015-10-16 ENCOUNTER — Ambulatory Visit
Admission: RE | Admit: 2015-10-16 | Discharge: 2015-10-16 | Disposition: A | Discharge status: Home / Self Care | Payer: Medicaid Other | Source: Ambulatory Visit | Attending: Radiation Oncology | Admitting: Radiation Oncology

## 2015-10-16 DIAGNOSIS — C50212 Malignant neoplasm of upper-inner quadrant of left female breast: Secondary | ICD-10-CM | POA: Diagnosis not present

## 2015-10-17 ENCOUNTER — Ambulatory Visit
Admission: RE | Admit: 2015-10-17 | Discharge: 2015-10-17 | Disposition: A | Discharge status: Home / Self Care | Payer: Medicaid Other | Source: Ambulatory Visit | Attending: Radiation Oncology | Admitting: Radiation Oncology

## 2015-10-17 DIAGNOSIS — C50212 Malignant neoplasm of upper-inner quadrant of left female breast: Secondary | ICD-10-CM

## 2015-10-17 MED ORDER — ALRA NON-METALLIC DEODORANT (RAD-ONC)
1.0000 "application " | Freq: Once | TOPICAL | Status: AC
  Administered 2015-10-17: 1 via TOPICAL

## 2015-10-20 ENCOUNTER — Ambulatory Visit
Admission: RE | Admit: 2015-10-20 | Discharge: 2015-10-20 | Disposition: A | Discharge status: Home / Self Care | Payer: Medicaid Other | Source: Ambulatory Visit | Attending: Radiation Oncology | Admitting: Radiation Oncology

## 2015-10-20 DIAGNOSIS — C50212 Malignant neoplasm of upper-inner quadrant of left female breast: Secondary | ICD-10-CM | POA: Diagnosis not present

## 2015-10-21 ENCOUNTER — Ambulatory Visit
Admission: RE | Admit: 2015-10-21 | Discharge: 2015-10-21 | Disposition: A | Discharge status: Home / Self Care | Payer: Medicaid Other | Source: Ambulatory Visit | Attending: Radiation Oncology | Admitting: Radiation Oncology

## 2015-10-21 ENCOUNTER — Encounter: Payer: Self-pay | Admitting: *Deleted

## 2015-10-21 DIAGNOSIS — C50212 Malignant neoplasm of upper-inner quadrant of left female breast: Secondary | ICD-10-CM

## 2015-10-21 NOTE — Progress Notes (Signed)
Spoke to pt concerning xrt therapy. Relate doing well but that "it is a struggle" and is "overwhelming to come every day". Gave pt emotional support and encouragement. Discussed with pt need to see PT d/t some arm pain. Pt relate she "will not do physical therapy right now, it's too much for me".  Discussed ABC class and possibility of doing class after xrt. Pt will "think about it". Denies further needs at this time. Encourage pt to call questions or concerns.

## 2015-10-21 NOTE — Progress Notes (Signed)
Weekly Management Note Current Dose: 19.8  Gy  Projected Dose: 50.4 Gy   Narrative:  The patient presents for routine under treatment assessment.  CBCT/MVCT images/Port film x-rays were reviewed.  The chart was checked. Pt refused to come around for evaluation today. "I have other obligations" and per therapists I had 2 mintues to get back to treatment machine or she was leaving. Had arm/chest pain x 1 last week. Took left over pain medication adn this resovled. No pain since but requests pain medication prescription "just in case."  Per patient she tried tylenol and it didn't work.  Describes pain along chest and up her arm.  "It is getting really hard for me to cope with coming.  I'm trying but I might not come to radiation any more."  "I'm not a pill popper."  Physical Findings: Dry skin, slightly dark over left chest.   Impression:  The patient is tolerating radiation.  Plan:  Continue treatment as planned. We discussed non-narcotic pain medications such as ibuprofen which she did not want to ry.  I suggested again that she go to our ABC class or physical therapy to see about her arm pain. I am reluctant to give her a prescription for pain times one.  She was upset that I would not give her a pain medication prescription. She said she would not go to physical therapy and would probably stop radiation if her pain became too much.  I asked her to monitor her pain and we would discuss next week. She can certainly stop by nursing or let the therapists know if her pain worsens.  She will continue her radiaplex.

## 2015-10-22 ENCOUNTER — Telehealth: Payer: Self-pay | Admitting: *Deleted

## 2015-10-22 ENCOUNTER — Ambulatory Visit
Admission: RE | Admit: 2015-10-22 | Discharge: 2015-10-22 | Disposition: A | Discharge status: Home / Self Care | Payer: Medicaid Other | Source: Ambulatory Visit | Attending: Radiation Oncology | Admitting: Radiation Oncology

## 2015-10-22 DIAGNOSIS — C50212 Malignant neoplasm of upper-inner quadrant of left female breast: Secondary | ICD-10-CM | POA: Diagnosis not present

## 2015-10-22 NOTE — Telephone Encounter (Signed)
Spoke with Catherine [name] she had a question if the radiation could be causing her Stanley piercing to come out.   I told her per Dr. Pablo Ledger that the radiation was not causing her Clark piercing to come out of position.  She thank me and said okay.

## 2015-10-23 ENCOUNTER — Ambulatory Visit
Admission: RE | Admit: 2015-10-23 | Discharge: 2015-10-23 | Disposition: A | Discharge status: Home / Self Care | Payer: Medicaid Other | Source: Ambulatory Visit | Attending: Radiation Oncology | Admitting: Radiation Oncology

## 2015-10-23 DIAGNOSIS — C50212 Malignant neoplasm of upper-inner quadrant of left female breast: Secondary | ICD-10-CM | POA: Diagnosis not present

## 2015-10-24 ENCOUNTER — Ambulatory Visit
Admission: RE | Admit: 2015-10-24 | Discharge: 2015-10-24 | Disposition: A | Discharge status: Home / Self Care | Payer: Medicaid Other | Source: Ambulatory Visit | Attending: Radiation Oncology | Admitting: Radiation Oncology

## 2015-10-24 ENCOUNTER — Encounter: Payer: Self-pay | Admitting: *Deleted

## 2015-10-24 DIAGNOSIS — C50212 Malignant neoplasm of upper-inner quadrant of left female breast: Secondary | ICD-10-CM | POA: Diagnosis not present

## 2015-10-24 NOTE — Progress Notes (Signed)
Spoke to pt after xrt treatment.

## 2015-10-27 ENCOUNTER — Ambulatory Visit
Admission: RE | Admit: 2015-10-27 | Discharge: 2015-10-27 | Disposition: A | Discharge status: Home / Self Care | Payer: Medicaid Other | Source: Ambulatory Visit | Attending: Radiation Oncology | Admitting: Radiation Oncology

## 2015-10-27 DIAGNOSIS — C50212 Malignant neoplasm of upper-inner quadrant of left female breast: Secondary | ICD-10-CM | POA: Diagnosis not present

## 2015-10-28 ENCOUNTER — Ambulatory Visit
Admission: RE | Admit: 2015-10-28 | Discharge: 2015-10-28 | Disposition: A | Discharge status: Home / Self Care | Payer: Medicaid Other | Source: Ambulatory Visit | Attending: Radiation Oncology | Admitting: Radiation Oncology

## 2015-10-28 ENCOUNTER — Telehealth: Payer: Self-pay | Admitting: *Deleted

## 2015-10-28 ENCOUNTER — Encounter: Payer: Self-pay | Admitting: *Deleted

## 2015-10-28 DIAGNOSIS — C50212 Malignant neoplasm of upper-inner quadrant of left female breast: Secondary | ICD-10-CM | POA: Diagnosis not present

## 2015-10-28 NOTE — Telephone Encounter (Signed)
Pt called wanting to stop xrt d/t her frustrations of having to come everyday. Validated pts feelings and gave emotional support and encouragement. Discussed reasoning and importance to finish her treatment. Pt has agreed to complete xrt.

## 2015-10-29 ENCOUNTER — Ambulatory Visit
Admission: RE | Admit: 2015-10-29 | Discharge: 2015-10-29 | Disposition: A | Discharge status: Home / Self Care | Payer: Medicaid Other | Source: Ambulatory Visit | Attending: Radiation Oncology | Admitting: Radiation Oncology

## 2015-10-29 DIAGNOSIS — C50212 Malignant neoplasm of upper-inner quadrant of left female breast: Secondary | ICD-10-CM | POA: Diagnosis not present

## 2015-10-30 ENCOUNTER — Ambulatory Visit
Admission: RE | Admit: 2015-10-30 | Discharge: 2015-10-30 | Disposition: A | Discharge status: Home / Self Care | Payer: Medicaid Other | Source: Ambulatory Visit | Attending: Radiation Oncology | Admitting: Radiation Oncology

## 2015-10-30 ENCOUNTER — Encounter: Payer: Self-pay | Admitting: Radiation Oncology

## 2015-10-30 ENCOUNTER — Encounter: Payer: Self-pay | Admitting: *Deleted

## 2015-10-30 VITALS — BP 107/54 | HR 100 | Temp 98.0°F | Resp 18 | Ht 66.0 in

## 2015-10-30 DIAGNOSIS — C50212 Malignant neoplasm of upper-inner quadrant of left female breast: Secondary | ICD-10-CM

## 2015-10-30 MED ORDER — RADIAPLEXRX EX GEL
Freq: Once | CUTANEOUS | Status: AC
  Administered 2015-10-30: 12:00:00 via TOPICAL

## 2015-10-30 NOTE — Progress Notes (Signed)
Catherine [name] has received 18 fractions to her left breast.  Skin to the left breast with tanning using Radiaplex gel.  Appetite is good.  Says her energy level is good today.  Denies having pain. Explained the importance of coming around weekly for her PUT visits. BP 107/54 mmHg  Pulse 100  Temp(Src) 98 F (36.7 C) (Oral)  Resp 18  Ht 5\' 6"  (1.676 m)  SpO2 100%  LMP 06/30/2013

## 2015-10-30 NOTE — Progress Notes (Signed)
Weekly Management Note Current Dose:  32.4 Gy  Projected Dose: 50.4 Gy   Narrative:  The patient presents for routine under treatment assessment.  CBCT/MVCT images/Port film x-rays were reviewed.  The chart was checked. Doing well. Saw on treatment machine. Skin is sore.   Physical Findings: Dark skin. No moist desquamation.   Impression:  The patient is tolerating radiation.  Plan:  Continue treatment as planned. Continue radiaplex.

## 2015-10-30 NOTE — Progress Notes (Signed)
[place] [facility]  Clinical Social Work was referred by Pension scheme manager for assessment of psychosocial needs.  Clinical Social Worker phoned pt patient at home to offer support and assess for needs.  Pt not open to talking with CSW today. Stated "oh, no, honey. I am fine". CSW has spoke to pt several times and will try again tomorrow.     Loren Racer, [employer] Worker [place]  [place] Phone: [phone] Fax: [phone]

## 2015-10-31 ENCOUNTER — Ambulatory Visit
Admission: RE | Admit: 2015-10-31 | Discharge: 2015-10-31 | Disposition: A | Discharge status: Home / Self Care | Payer: Medicaid Other | Source: Ambulatory Visit | Attending: Radiation Oncology | Admitting: Radiation Oncology

## 2015-10-31 DIAGNOSIS — C50212 Malignant neoplasm of upper-inner quadrant of left female breast: Secondary | ICD-10-CM | POA: Diagnosis not present

## 2015-11-03 ENCOUNTER — Ambulatory Visit
Admission: RE | Admit: 2015-11-03 | Discharge: 2015-11-03 | Disposition: A | Discharge status: Home / Self Care | Payer: Medicaid Other | Source: Ambulatory Visit | Attending: Radiation Oncology | Admitting: Radiation Oncology

## 2015-11-03 DIAGNOSIS — C50212 Malignant neoplasm of upper-inner quadrant of left female breast: Secondary | ICD-10-CM | POA: Diagnosis not present

## 2015-11-04 ENCOUNTER — Ambulatory Visit
Admission: RE | Admit: 2015-11-04 | Discharge: 2015-11-04 | Disposition: A | Discharge status: Home / Self Care | Payer: Medicaid Other | Source: Ambulatory Visit | Attending: Radiation Oncology | Admitting: Radiation Oncology

## 2015-11-04 ENCOUNTER — Ambulatory Visit: Payer: Medicaid Other | Attending: Radiation Oncology | Admitting: Radiation Oncology

## 2015-11-04 DIAGNOSIS — C50212 Malignant neoplasm of upper-inner quadrant of left female breast: Secondary | ICD-10-CM | POA: Diagnosis not present

## 2015-11-04 NOTE — Progress Notes (Signed)
Weekly Management Note Current Dose: 34.2  Gy  Projected Dose: 50.4 Gy   Narrative:  The patient presents for routine under treatment assessment.  CBCT/MVCT images/Port film x-rays were reviewed.  The chart was checked. Doing well. Saw on treatment machine. Refuses to be seen in nursing. No complaints.   Physical Findings: Dark chest wall. No moist desquamation.   Impression:  The patient is tolerating radiation.  Plan:  Continue treatment as planned.

## 2015-11-05 ENCOUNTER — Ambulatory Visit
Admission: RE | Admit: 2015-11-05 | Discharge: 2015-11-05 | Disposition: A | Discharge status: Home / Self Care | Payer: Medicaid Other | Source: Ambulatory Visit | Attending: Radiation Oncology | Admitting: Radiation Oncology

## 2015-11-05 ENCOUNTER — Encounter: Payer: Self-pay | Admitting: Radiation Oncology

## 2015-11-05 VITALS — BP 134/89 | HR 90 | Temp 98.2°F | Ht 66.0 in

## 2015-11-05 DIAGNOSIS — C50212 Malignant neoplasm of upper-inner quadrant of left female breast: Secondary | ICD-10-CM

## 2015-11-05 NOTE — Progress Notes (Signed)
Weekly Management Note Current Dose: 39.6  Gy  Projected Dose: 50.4 Gy   Narrative:  The patient presents for routine under treatment assessment.  CBCT/MVCT images/Port film x-rays were reviewed.  The chart was checked. Doing well. Refuses to be seen in nursing. No complaints. RN assessment of patient in changing room. Using radiaplex. Dentist appointment today.   Physical Findings: Dark chest wall. No moist desquamation. Per RN.   Impression:  The patient is tolerating radiation.  Plan:  Continue treatment as planned.

## 2015-11-05 NOTE — Progress Notes (Signed)
Catherine [name] has received 22 fractions to her left breast.  Skin to left breast with dark hperpigmentation using Radiaplex gel.  Appetite is good.  Energy level is good. BP 134/89 mmHg  Pulse 90  Temp(Src) 98.2 F (36.8 C) (Oral)  Ht 5\' 6"  (1.676 m)  SpO2 100%  LMP 06/30/2013

## 2015-11-06 ENCOUNTER — Ambulatory Visit
Admission: RE | Admit: 2015-11-06 | Discharge: 2015-11-06 | Disposition: A | Discharge status: Home / Self Care | Payer: Medicaid Other | Source: Ambulatory Visit | Attending: Radiation Oncology | Admitting: Radiation Oncology

## 2015-11-06 ENCOUNTER — Ambulatory Visit: Payer: Medicaid Other

## 2015-11-06 ENCOUNTER — Telehealth: Payer: Self-pay | Admitting: *Deleted

## 2015-11-06 NOTE — Telephone Encounter (Signed)
Pt called to relay she is cancelling her xrt appt for today and request I call xrt therapist. Called [facility] to inform of her cancellation.

## 2015-11-07 ENCOUNTER — Ambulatory Visit (HOSPITAL_BASED_OUTPATIENT_CLINIC_OR_DEPARTMENT_OTHER): Payer: Medicaid Other | Admitting: Nurse Practitioner

## 2015-11-07 ENCOUNTER — Telehealth: Payer: Self-pay | Admitting: Nurse Practitioner

## 2015-11-07 ENCOUNTER — Ambulatory Visit
Admission: RE | Admit: 2015-11-07 | Discharge: 2015-11-07 | Disposition: A | Discharge status: Home / Self Care | Payer: Medicaid Other | Source: Ambulatory Visit | Attending: Radiation Oncology | Admitting: Radiation Oncology

## 2015-11-07 ENCOUNTER — Encounter: Payer: Self-pay | Admitting: Nurse Practitioner

## 2015-11-07 VITALS — BP 121/72 | HR 90 | Temp 98.2°F | Resp 18 | Wt 136.7 lb

## 2015-11-07 DIAGNOSIS — Z720 Tobacco use: Secondary | ICD-10-CM | POA: Diagnosis not present

## 2015-11-07 DIAGNOSIS — F319 Bipolar disorder, unspecified: Secondary | ICD-10-CM | POA: Diagnosis not present

## 2015-11-07 DIAGNOSIS — C50212 Malignant neoplasm of upper-inner quadrant of left female breast: Secondary | ICD-10-CM

## 2015-11-07 DIAGNOSIS — Z170 Estrogen receptor positive status [ER+]: Secondary | ICD-10-CM | POA: Diagnosis not present

## 2015-11-07 NOTE — Progress Notes (Signed)
[place]  [phone] [phone]     ID: Catherine [name] DOB: 1961-01-24  MR#: [id number]  BVQ#:[id number]  Patient Care Team: Lucianne Lei, MD as PCP - General (Family Medicine) [name], MD as Consulting Physician (General Surgery) Chauncey Cruel, MD as Consulting Physician (Oncology) Arloa Koh, MD as Consulting Physician (Radiation Oncology) Mauro Kaufmann, RN as Registered Nurse Rockwell Germany, RN as Registered Nurse PCP: Elyn Peers, MD GYN: OTHER MD:   CHIEF COMPLAINT: Locally advanced breast cancer  CURRENT TREATMENT:radiation, tamoxifen on hold  BREAST CANCER HISTORY: From the original intake note:  Catherine [name] tells me she has always had many breast cysts, and that the 1 in the left breast that turned out to be cancer didn't seem any different from any of the other ones. She did notice some skin dimpling so after a few months she brought it to the attention of Dr. Criss Rosales and on 05/28/2015 she underwent bilateral diagnostic mammography with tomosynthesis and left breast ultrasonography at [facility]. The most recent mammogram prior to this was 2009. The breast density was category C. In the left breast upper inner quadrant there was a 4 cm irregular mass associated with architectural distortion and nipple retraction. By ultrasound this measured 4 cm, and had micro-lobulated margins. There was a lymph node in the left axilla with a focus of cortical thickening.  On 06/05/2015 the patient underwent biopsy of the breast mass in question. This showed (SAA [id number]) and invasive ductal carcinoma, grade 2 or 3, estrogen receptor 95% positive, progesterone receptor 40% positive, both with strong staining intensity, with an MIB-1 of 20%, and HER-2 equivocal for amplification, the signals ratio being 1.43 but the number per cell 4.38. Additional studies are pending to clarify the HER-2 status.  Biopsy of the abnormal lymph node in the left axilla was  also scheduled for 06/05/2015 but the patient refused  The patient's subsequent history is as detailed below  INTERVAL HISTORY: [employer] today for follow-up of her locally advanced breast cancer, alone. She is currently undergoing daily radiation and has 5 treatments left. She (intelligently) decided to hold off on tamoxifen use until her course of radiation is completed. She is here today with concerns about her left side and ribs. Last week she developed pain to this area and was able to palpate raised bumps. In this week's time, the bumps have resolved, and her pain is gone since her friend gave her an advil gel capsule. She has not had to repeat that dose.   REVIEW OF SYSTEMS: A detailed review of systems is otherwise entirely negative, except where noted above.   PAST MEDICAL HISTORY: Past Medical History  Diagnosis Date  . Breast cancer of upper-inner quadrant of left female breast ([place]) 06/09/2015  . Breast cancer ([place])   . Depression   . Anxiety     pt very anxious on phone    PAST SURGICAL HISTORY: Past Surgical History  Procedure Laterality Date  . Evacuation breast hematoma Left 07/02/2015    Procedure: EVACUATION HEMATOMA LEFT Mastectomy Site;  Surgeon: Judeth Horn, MD;  Location: [place];  Service: General;  Laterality: Left;  Marland Kitchen Mastectomy w/ sentinel node biopsy Left 07/01/2015    Procedure: LEFT MASTECTOMY WITH SENTINEL LYMPH NODE BIOPSY;  Surgeon: [name], MD;  Location: [place];  Service: General;  Laterality: Left;  . Portacath placement N/A 07/01/2015    Procedure: INSERTION PORT-A-CATH;  Surgeon: [name], MD;  Location: [place];  Service:  General;  Laterality: N/A;  . Port-a-cath removal Right 09/04/2015    Procedure: REMOVAL PORT-A-CATH;  Surgeon: [name], MD;  Location: [place];  Service: General;  Laterality: Right;    FAMILY HISTORY No family history on file. The patient's parents are in their mid to late 75s. Catherine [name] had 4  brothers, 2 sisters. There is a history of prostate and brain cancer on the maternal side but no history of ovarian or breast cancer  GYNECOLOGIC HISTORY:  Patient's last menstrual period was 06/30/2013. Menarche age 23, first live birth age 71. The patient is GX P2. She stopped having periods at age 30. She did not take hormone replacement. She never used oral contraceptives.  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 At home is just she and her "baby" Catherine [name], 22, who is going to school. Son Catherine [name], 48, works for the [employer] and lives in [place]. The patient has 2 grandchildren. She had dense a local [employer] (harvest gathering".    ADVANCED DIRECTIVES: Not in place. At the initial clinic visit the patient was given the appropriate forms to complete and notarize at her discretion.     HEALTH MAINTENANCE: Social History  Substance Use Topics  . Smoking status: Current Every Day Smoker -- 1.00 packs/day for 30 years    Types: Cigarettes  . Smokeless tobacco: Not on file  . Alcohol Use: 0.6 oz/week    1 Glasses of wine per week     Comment: daily     Colonoscopy: Never  PAP:  Bone density: Never  Lipid panel:  Allergies  Allergen Reactions  . Codeine Nausea And Vomiting  . Metronidazole Nausea And Vomiting    Current Outpatient Prescriptions  Medication Sig Dispense Refill  . HYDROcodone-acetaminophen (NORCO) 10-325 MG tablet Take 1-2 tablets by mouth every 6 (six) hours as needed for moderate pain or severe pain. (Patient not taking: Reported on 11/05/2015) 20 tablet 0  . HYDROcodone-acetaminophen (NORCO/VICODIN) 5-325 MG tablet Reported on 11/05/2015  0   No current facility-administered medications for this visit.    OBJECTIVE: Middle-aged African-American woman in no acute distress Filed Vitals:   11/07/15 0920  BP: 121/72  Pulse: 90  Temp: 98.2 F (36.8 C)  Resp: 18     Body  mass index is 22.07 kg/(m^2).    ECOG FS:0 - Asymptomatic  Skin: warm, dry  HEENT: sclerae anicteric, conjunctivae pink, oropharynx clear. No thrush or mucositis.  Lymph Nodes: No cervical or supraclavicular lymphadenopathy  Lungs: clear to auscultation bilaterally, no rales, wheezes, or rhonci  Heart: regular rate and rhythm  Abdomen: round, soft, non tender, positive bowel sounds  Musculoskeletal: No focal spinal tenderness, no peripheral edema  Neuro: non focal, well oriented, positive affect  Breasts: left breast status post lumpectomy and ongoing radiation. Hyperpigmentation noted to this are. No significant irregularity or nodules palpated. Minimal tenderness. Left axilla benign. Right breast unremarkable.   LAB RESULTS:  CMP     Component Value Date/Time   NA 143 07/02/2015 1850   NA 143 06/11/2015 0849   K 3.1* 07/02/2015 1850   K 3.7 06/11/2015 0849   CL 109 07/02/2015 0630   CO2 24 07/02/2015 0630   CO2 26 06/11/2015 0849   GLUCOSE 130* 07/02/2015 1850   GLUCOSE 122 06/11/2015 0849   BUN 10 07/02/2015 0630   BUN 15.6 06/11/2015 0849   CREATININE 0.91 07/02/2015 0630  CREATININE 0.9 06/11/2015 0849   CALCIUM 9.5 07/02/2015 0630   CALCIUM 10.5* 06/11/2015 0849   PROT 7.4 06/11/2015 0849   ALBUMIN 4.4 06/11/2015 0849   AST 16 06/11/2015 0849   ALT 17 06/11/2015 0849   ALKPHOS 94 06/11/2015 0849   BILITOT 0.37 06/11/2015 0849   GFRNONAA >60 07/02/2015 0630   GFRAA >60 07/02/2015 0630    INo results found for: SPEP, UPEP  Lab Results  Component Value Date   WBC 11.3* 07/03/2015   NEUTROABS 6.4 07/03/2015   HGB 8.3* 07/03/2015   HCT 24.3* 07/03/2015   MCV 87.4 07/03/2015   PLT 110* 07/03/2015      Chemistry      Component Value Date/Time   NA 143 07/02/2015 1850   NA 143 06/11/2015 0849   K 3.1* 07/02/2015 1850   K 3.7 06/11/2015 0849   CL 109 07/02/2015 0630   CO2 24 07/02/2015 0630   CO2 26 06/11/2015 0849   BUN 10 07/02/2015 0630   BUN 15.6  06/11/2015 0849   CREATININE 0.91 07/02/2015 0630   CREATININE 0.9 06/11/2015 0849      Component Value Date/Time   CALCIUM 9.5 07/02/2015 0630   CALCIUM 10.5* 06/11/2015 0849   ALKPHOS 94 06/11/2015 0849   AST 16 06/11/2015 0849   ALT 17 06/11/2015 0849   BILITOT 0.37 06/11/2015 0849       No results found for: LABCA2  No components found for: LABCA125  No results for input(s): INR in the last 168 hours.  Urinalysis No results found for: COLORURINE, APPEARANCEUR, LABSPEC, PHURINE, GLUCOSEU, HGBUR, BILIRUBINUR, KETONESUR, PROTEINUR, UROBILINOGEN, NITRITE, LEUKOCYTESUR  STUDIES: No results found.  ASSESSMENT: 55 y.o. [place] woman status post left breast biopsy 06/05/2015 for a clinical T2 NX, stage II or 3 invasive ductal carcinoma, estrogen and progesterone receptor positive, with an MIB-1 of 20%, and equivocal HER-2 amplification  (a) the patient refused biopsy of a suspicious left axillary lymph node  (1) status post left mastectomy and sentinel lymph node sampling 07/01/2015 for a pT3 pN0, stage IIB invasive ductal carcinoma, grade 2, estrogen and progesterone receptor positive, HER-2 not amplified, with an MIB-1 of 30%.  (2) Oncotype DX score of 16 predicts a risk of recurrence outside the breast within 10 years of 10% if the patient's only systemic therapy is tamoxifen for 5 years. It also predicts no significant benefit from chemotherapy.  (3) adjuvant radiation completed 11/14/15  (4) tamoxifen on hold until completion of radiation  (5) bipolar disorder: The patient refuses medication at this time  (6) tobacco abuse disorder: The patient has been strongly advised to quit  PLAN: Debraann is in good spirits today. She has just 1 week left in radiation. We discussed restarting tamoxifen after this is completed. We reviewed the potential side effects of this drug, and Mirai is comfortable with this.   She is no longer in any pain, and the "knots" to her left side are  gone. I told her that advil is perfectly fine to use for any pain she experiences in the future. It would not be a bad idea to have them on hand just in case.   Brae will return in 2 months for a follow up visit. She understands and agrees with this plan. She knows the goal of treatment in her case is cure. She has been encouraged to call with any issues that might arise before her next visit here.   Sheffield Slider, NP   11/07/2015 10:14  AM   

## 2015-11-07 NOTE — Telephone Encounter (Signed)
appt made and avs printed °

## 2015-11-10 ENCOUNTER — Ambulatory Visit
Admission: RE | Admit: 2015-11-10 | Discharge: 2015-11-10 | Disposition: A | Discharge status: Home / Self Care | Payer: Medicaid Other | Source: Ambulatory Visit | Attending: Radiation Oncology | Admitting: Radiation Oncology

## 2015-11-10 DIAGNOSIS — C50212 Malignant neoplasm of upper-inner quadrant of left female breast: Secondary | ICD-10-CM | POA: Diagnosis not present

## 2015-11-11 ENCOUNTER — Ambulatory Visit
Admission: RE | Admit: 2015-11-11 | Discharge: 2015-11-11 | Disposition: A | Discharge status: Home / Self Care | Payer: Medicaid Other | Source: Ambulatory Visit | Attending: Radiation Oncology | Admitting: Radiation Oncology

## 2015-11-11 DIAGNOSIS — C50212 Malignant neoplasm of upper-inner quadrant of left female breast: Secondary | ICD-10-CM | POA: Diagnosis not present

## 2015-11-11 NOTE — Progress Notes (Signed)
Weekly Management Note Current Dose: 45 Gy  Projected Dose: 50.4 Gy   Narrative:  The patient presents for routine under treatment assessment.  CBCT/MVCT images/Port film x-rays were reviewed.  The chart was checked. Doing well. Refuses to be seen in nursing. No complaints. Saw and examined in dressing room.   Physical Findings: Dark chest wall. No moist desquamation.  Impression:  The patient is tolerating radiation.  Plan:  Continue treatment as planned.

## 2015-11-12 ENCOUNTER — Ambulatory Visit
Admission: RE | Admit: 2015-11-12 | Discharge: 2015-11-12 | Disposition: A | Discharge status: Home / Self Care | Payer: Medicaid Other | Source: Ambulatory Visit | Attending: Radiation Oncology | Admitting: Radiation Oncology

## 2015-11-12 ENCOUNTER — Ambulatory Visit: Payer: Medicaid Other

## 2015-11-12 DIAGNOSIS — C50212 Malignant neoplasm of upper-inner quadrant of left female breast: Secondary | ICD-10-CM | POA: Diagnosis not present

## 2015-11-13 ENCOUNTER — Ambulatory Visit: Payer: Medicaid Other

## 2015-11-13 ENCOUNTER — Ambulatory Visit
Admission: RE | Admit: 2015-11-13 | Discharge: 2015-11-13 | Disposition: A | Discharge status: Home / Self Care | Payer: Medicaid Other | Source: Ambulatory Visit | Attending: Radiation Oncology | Admitting: Radiation Oncology

## 2015-11-13 DIAGNOSIS — C50212 Malignant neoplasm of upper-inner quadrant of left female breast: Secondary | ICD-10-CM | POA: Diagnosis not present

## 2015-11-14 ENCOUNTER — Ambulatory Visit
Admission: RE | Admit: 2015-11-14 | Discharge: 2015-11-14 | Disposition: A | Discharge status: Home / Self Care | Payer: Medicaid Other | Source: Ambulatory Visit | Attending: Radiation Oncology | Admitting: Radiation Oncology

## 2015-11-14 ENCOUNTER — Telehealth: Payer: Self-pay | Admitting: *Deleted

## 2015-11-14 ENCOUNTER — Ambulatory Visit: Payer: Medicaid Other

## 2015-11-14 DIAGNOSIS — C50212 Malignant neoplasm of upper-inner quadrant of left female breast: Secondary | ICD-10-CM | POA: Diagnosis not present

## 2015-11-14 NOTE — Telephone Encounter (Signed)
Left vm to congratulate pt on completing xrt. Request return call to assess needs afterwards and to discuss survivorship program. Contact information provided.

## 2015-11-17 ENCOUNTER — Encounter: Payer: Self-pay | Admitting: *Deleted

## 2015-11-17 ENCOUNTER — Telehealth: Payer: Self-pay | Admitting: *Deleted

## 2015-11-17 NOTE — Telephone Encounter (Signed)
Pt called and relayed she has decided to have CT/Bone scan. She "wants piece of mind". Informed pt that she will receive a call from central scheduling to make appt. Pt request to have CT/Bone scan after her appt with dr. Pablo Ledger on 5/11. Denies further needs at this time. Encourage pt call with needs. Received verbal understanding.

## 2015-11-19 ENCOUNTER — Telehealth: Payer: Self-pay | Admitting: Oncology

## 2015-11-19 ENCOUNTER — Telehealth: Payer: Self-pay | Admitting: *Deleted

## 2015-11-19 NOTE — Telephone Encounter (Signed)
lvm to inform patient of CT CAP and Bone scan appt on 5/11 at 10 am at [facility]. Informed pt that she will need to pick up contrast in office prior to appt

## 2015-11-19 NOTE — Telephone Encounter (Signed)
Pt called to verify CT/Bone scan appts. Discussed appts and contrast. Discussed wanting to have colonoscopy and wanted to know where to go. Message sent to Manuela Schwartz, GI navigator to get information to pass on to pt. Denies further needs at this time.

## 2015-11-20 ENCOUNTER — Telehealth: Payer: Self-pay | Admitting: *Deleted

## 2015-11-20 NOTE — Telephone Encounter (Signed)
Pt called to request locations and numbers for colonscopy. Left pt vm to return call to give information.

## 2015-11-21 ENCOUNTER — Ambulatory Visit: Payer: Medicaid Other

## 2015-11-24 ENCOUNTER — Telehealth: Payer: Self-pay | Admitting: *Deleted

## 2015-11-24 NOTE — Telephone Encounter (Signed)
Left vm for pt to return call  

## 2015-11-27 ENCOUNTER — Encounter: Payer: Self-pay | Admitting: Radiation Oncology

## 2015-11-27 NOTE — Progress Notes (Signed)
  Radiation Oncology         [phone] ________________________________  Name: Catherine [name] MRN: [id number]  Date: 11/27/2015  DOB: August 18, 1960  End of Treatment Note  Diagnosis:  Breast cancer of upper-inner quadrant of left female breast ([place])   Staging form: Breast, AJCC 7th Edition     Clinical: Stage IIB (T2, N1, M0) - Signed by Chauncey Cruel, MD on 06/11/2015     Indication for treatment:  Curative       Radiation treatment dates:   10/06/15-11/14/15  Site/dose:    Left chest wall / 50.4 Gray @ 1.8 [name] per fraction x 28 fractions  Beams/energy:  Opposed Tangents / 6 MV photons  Narrative: The patient tolerated radiation treatment relatively well.   She had some soreness of her left arm but declined physical therapy referral. She had some hyperpigmentation of the skin but no moist desquamation.   Plan: The patient has completed radiation treatment. The patient will return to radiation oncology clinic for routine followup in one month. I advised them to call or return sooner if they have any questions or concerns related to their recovery or treatment.  ------------------------------------------------  [name], MD

## 2015-12-03 ENCOUNTER — Encounter: Payer: Self-pay | Admitting: Radiation Oncology

## 2015-12-14 NOTE — Progress Notes (Addendum)
Catherine [name] is here for a one month for follow up visit for breast cancer upper-inner quadrant of left female breast  Skin status:Would not let me look Lotion being used: Not using lotion with vitamin E Have you seen your medical oncologist? Date If not ,when is appointment 01-07-16 Catherine [name] ER+,have started AI or Tamoxifen? If not, why? Tamoxifen on hold Discuss survivorship appointment: Catherine [name] No scheduled appointment Offer referral reading material for Survivorship, [employer] and [facility] given 12-18-15  Appetite:Good Pain:None Arm mobility: Able to raise left arm without discomfort Energy level:Have fatigue some days. BP 141/84 mmHg  Pulse 106  Temp(Src) 98.2 F (36.8 C) (Oral)  Resp 16  Ht 5\' 6"  (1.676 m)  Wt 138 lb 1.6 oz (62.642 kg)  BMI 22.30 kg/m2  SpO2 100%  LMP 06/30/2013

## 2015-12-16 ENCOUNTER — Telehealth: Payer: Self-pay | Admitting: *Deleted

## 2015-12-16 ENCOUNTER — Other Ambulatory Visit: Payer: Self-pay | Admitting: Oncology

## 2015-12-16 NOTE — Telephone Encounter (Signed)
Pt called to discuss scans for 12/18/15. After long discussion, pt relate she would like to cancel her bone scan and keep her CT Scan. Relate to pt that I will cancel her bone scan. Pt to pick up contrast on Thursday prior to appt with Dr. Pablo Ledger. Gave instructions on how to drink contrast. Denies further needs at this time. Encourage pt to call with questions or concerns. Received verbal understanding.

## 2015-12-17 ENCOUNTER — Telehealth: Payer: Self-pay | Admitting: Oncology

## 2015-12-18 ENCOUNTER — Encounter: Payer: Self-pay | Admitting: Radiation Oncology

## 2015-12-18 ENCOUNTER — Ambulatory Visit (HOSPITAL_COMMUNITY): Payer: Medicaid Other

## 2015-12-18 ENCOUNTER — Ambulatory Visit
Admission: RE | Admit: 2015-12-18 | Discharge: 2015-12-18 | Disposition: A | Discharge status: Home / Self Care | Payer: Medicaid Other | Source: Ambulatory Visit | Attending: Radiation Oncology | Admitting: Radiation Oncology

## 2015-12-18 VITALS — BP 141/84 | HR 106 | Temp 98.2°F | Resp 16 | Ht 66.0 in | Wt 138.1 lb

## 2015-12-18 DIAGNOSIS — C50212 Malignant neoplasm of upper-inner quadrant of left female breast: Secondary | ICD-10-CM

## 2015-12-18 NOTE — Progress Notes (Signed)
   Department of Radiation Oncology  Phone:  [phone] Fax:        [phone]   Name: Catherine [name] MRN: [id number]  DOB: [birth year]  Date: 12/18/2015  Follow Up Visit Note  Diagnosis: Breast cancer of upper-inner quadrant of left female breast [name]   Staging form: Breast, AJCC 7th Edition     Clinical: Stage IIB (T2, N1, M0) - Signed by Chauncey Cruel, MD on 06/11/2015  Summary and Interval since last radiation: 1 month  10/06/15-11/14/15: Left chest wall / 50.4 Gray @ 1.8 [name] per fraction x 28 fractions  Interval History: Catherine [name] presents today for routine followup. She stopped her Tamoxifen due to vaginal dryness. She has a meeting next week with Dr. Jana Hakim to discuss other treatments.  Physical Exam:  Filed Vitals:   12/18/15 0836  BP: 141/84  Pulse: 106  Temp: 98.2 F (36.8 C)  TempSrc: Oral  Resp: 16  Height: 5\' 6"  (1.676 m)  Weight: 138 lb 1.6 oz (62.642 kg)  SpO2: 100%    She has hyperpigmentation over the left chest wall, which is healing well. No signs of hypopigmentation.  IMPRESSION: Catherine [name] is a 55 y.o. female with Stage IIB invasive ductal carcinoma of the left breast.  PLAN: She is doing well. We discussed the need for follow up every 4-6 months which she has scheduled.  We discussed the need for yearly mammograms which she can schedule with her OBGYN or with medical oncology. We discussed the need for sun protection in the treated area.  I advised her to use Vitamin E lotion over the skin of her left chest wall.  She can always call me with questions.  I will follow up with her in 3 months, per her request, for a skin check.  Catherine Silversmith, MD  This document serves as a record of services personally performed by Catherine Silversmith, MD. It was created on her behalf by Darcus Austin, a trained medical scribe. The creation of this record is based on the scribe's personal observations and the provider's statements to them. This document has been checked and  approved by the attending provider.

## 2015-12-19 ENCOUNTER — Telehealth: Payer: Self-pay | Admitting: Medical Oncology

## 2015-12-19 NOTE — Telephone Encounter (Signed)
erroneous

## 2015-12-24 ENCOUNTER — Telehealth: Payer: Self-pay | Admitting: *Deleted

## 2015-12-24 NOTE — Telephone Encounter (Signed)
This RN spoke with pt per her message requesting a return call " from that nurse that spoke to me late yesterday "  Of note this RN had returned a message from pt 5/15 with pt stating she wanted to speak only to [facility] NP. This RN informed pt Nira Conn was with patients and call may not be returned until the following day.  This RN returned call post message left this am.  Pt was very pleasant but stated " I am with my family right now and don't want to talk but wanted to let you know that I cannot take the tamoxifen "  Of note pt did inquire " since you called me back could I see you on the 31st ".  This RN informed pt that she was speaking with an RN whose job is to assist patients with Gentry Fitz who is a Engineer, mining and Dr Jana Hakim. Explained to [place] that Mrs Boelter " outranks" this RN and she needs to be seen by the appropriate person.  This RN reiterated that this RN's job is to assist people like her on the phone with concerns and that is why call was returned yesterday by this RN also informed her that it is difficult for Mrs Lucrezia Starch or the MD to return calls in a timely manner due to seeing pts in the office all day. Informed her when she calls she should ask for RN to discuss her concerns so she could obtained communication more quickly.  Dacruz verbalized understanding of above " and thanks for talking with me "- pt has no other concerns at this time.

## 2016-01-07 ENCOUNTER — Ambulatory Visit (HOSPITAL_BASED_OUTPATIENT_CLINIC_OR_DEPARTMENT_OTHER): Payer: Medicaid Other | Admitting: Nurse Practitioner

## 2016-01-07 ENCOUNTER — Encounter: Payer: Self-pay | Admitting: Nurse Practitioner

## 2016-01-07 ENCOUNTER — Telehealth: Payer: Self-pay | Admitting: Nurse Practitioner

## 2016-01-07 ENCOUNTER — Other Ambulatory Visit (HOSPITAL_BASED_OUTPATIENT_CLINIC_OR_DEPARTMENT_OTHER): Payer: Medicaid Other

## 2016-01-07 VITALS — BP 129/87 | HR 83 | Temp 98.4°F | Resp 19 | Wt 137.8 lb

## 2016-01-07 DIAGNOSIS — Z79811 Long term (current) use of aromatase inhibitors: Secondary | ICD-10-CM

## 2016-01-07 DIAGNOSIS — C50212 Malignant neoplasm of upper-inner quadrant of left female breast: Secondary | ICD-10-CM

## 2016-01-07 LAB — CBC WITH DIFFERENTIAL/PLATELET
BASO%: 0.8 % (ref 0.0–2.0)
Basophils Absolute: 0.1 10*3/uL (ref 0.0–0.1)
EOS%: 0.4 % (ref 0.0–7.0)
Eosinophils Absolute: 0 10*3/uL (ref 0.0–0.5)
HEMATOCRIT: 41.6 % (ref 34.8–46.6)
HEMOGLOBIN: 13.9 g/dL (ref 11.6–15.9)
LYMPH#: 1.9 10*3/uL (ref 0.9–3.3)
LYMPH%: 27.8 % (ref 14.0–49.7)
MCH: 29.5 pg (ref 25.1–34.0)
MCHC: 33.4 g/dL (ref 31.5–36.0)
MCV: 88.2 fL (ref 79.5–101.0)
MONO#: 0.7 10*3/uL (ref 0.1–0.9)
MONO%: 9.4 % (ref 0.0–14.0)
NEUT#: 4.3 10*3/uL (ref 1.5–6.5)
NEUT%: 61.6 % (ref 38.4–76.8)
Platelets: 264 10*3/uL (ref 145–400)
RBC: 4.72 10*6/uL (ref 3.70–5.45)
RDW: 16.2 % — AB (ref 11.2–14.5)
WBC: 6.9 10*3/uL (ref 3.9–10.3)

## 2016-01-07 LAB — COMPREHENSIVE METABOLIC PANEL
ALT: 17 U/L (ref 0–55)
AST: 19 U/L (ref 5–34)
Albumin: 4.2 g/dL (ref 3.5–5.0)
Alkaline Phosphatase: 104 U/L (ref 40–150)
Anion Gap: 11 mEq/L (ref 3–11)
BUN: 12.9 mg/dL (ref 7.0–26.0)
CALCIUM: 10.4 mg/dL (ref 8.4–10.4)
CHLORIDE: 107 meq/L (ref 98–109)
CO2: 25 mEq/L (ref 22–29)
CREATININE: 1 mg/dL (ref 0.6–1.1)
EGFR: 73 mL/min/{1.73_m2} — ABNORMAL LOW (ref >90)
GLUCOSE: 93 mg/dL (ref 70–140)
Potassium: 4.1 mEq/L (ref 3.5–5.1)
SODIUM: 142 meq/L (ref 136–145)
Total Bilirubin: 0.3 mg/dL (ref 0.20–1.20)
Total Protein: 7.8 g/dL (ref 6.4–8.3)

## 2016-01-07 MED ORDER — ANASTROZOLE 1 MG PO TABS: 1.0000 mg | ORAL_TABLET | Freq: Every day | ORAL | Status: DC

## 2016-01-07 NOTE — Progress Notes (Signed)
[place]  Telephone:(336) [phone] [phone]) [phone]     ID: Catherine [name] DOB: 25-Mar-1961  MR#: [id number]  UXY#:[id number]  Patient Care Team: Lucianne Lei, MD as PCP - General (Family Medicine) [name], MD as Consulting Physician (General Surgery) Chauncey Cruel, MD as Consulting Physician (Oncology) Arloa Koh, MD as Consulting Physician (Radiation Oncology) Mauro Kaufmann, RN as Registered Nurse Rockwell Germany, RN as Registered Nurse PCP: Elyn Peers, MD GYN: OTHER MD:   CHIEF COMPLAINT: Locally advanced breast cancer  CURRENT TREATMENT:radiation, tamoxifen on hold  BREAST CANCER HISTORY: From the original intake note:  Catherine [name] tells me she has always had many breast cysts, and that the 1 in the left breast that turned out to be cancer didn't seem any different from any of the other ones. She did notice some skin dimpling so after a few months she brought it to the attention of Dr. Criss Rosales and on 05/28/2015 she underwent bilateral diagnostic mammography with tomosynthesis and left breast ultrasonography at [facility]. The most recent mammogram prior to this was 2009. The breast density was category C. In the left breast upper inner quadrant there was a 4 cm irregular mass associated with architectural distortion and nipple retraction. By ultrasound this measured 4 cm, and had micro-lobulated margins. There was a lymph node in the left axilla with a focus of cortical thickening.  On 06/05/2015 the patient underwent biopsy of the breast mass in question. This showed ([id number] [id number]) and invasive ductal carcinoma, grade 2 or 3, estrogen receptor 95% positive, progesterone receptor 40% positive, both with strong staining intensity, with an MIB-1 of 20%, and HER-2 equivocal for amplification, the signals ratio being 1.43 but the number per cell 4.38. Additional studies are pending to clarify the HER-2 status.  Biopsy of the abnormal lymph node in the left axilla was  also scheduled for 06/05/2015 but the patient refused  The patient's subsequent history is as detailed below  INTERVAL HISTORY: [employer] today for follow-up of her locally advanced breast cancer, alone. She started tamoxifen during our last visit, but stopped it after roughly 5-6 weeks because of increased discharge and itchiness. At the same time she complains of vaginal dryness and rawness. She has been off the drug for 3 weeks and feels mild improvement. She is upset during our visit because she recently found out her CT scans were denied.   REVIEW OF SYSTEMS: A detailed review of systems is otherwise entirely negative, except where noted above.   PAST MEDICAL HISTORY: Past Medical History  Diagnosis Date  . Breast cancer of upper-inner quadrant of left female breast ([place]) 06/09/2015  . Breast cancer ([place])   . Depression   . Anxiety     pt very anxious on phone    PAST SURGICAL HISTORY: Past Surgical History  Procedure Laterality Date  . Evacuation breast hematoma Left 07/02/2015    Procedure: EVACUATION HEMATOMA LEFT Mastectomy Site;  Surgeon: Judeth Horn, MD;  Location: [place];  Service: General;  Laterality: Left;  Marland Kitchen Mastectomy w/ sentinel node biopsy Left 07/01/2015    Procedure: LEFT MASTECTOMY WITH SENTINEL LYMPH NODE BIOPSY;  Surgeon: [name], MD;  Location: [place];  Service: General;  Laterality: Left;  . Portacath placement N/A 07/01/2015    Procedure: INSERTION PORT-A-CATH;  Surgeon: [name], MD;  Location: [place];  Service: General;  Laterality: N/A;  . Port-a-cath removal Right 09/04/2015    Procedure: REMOVAL PORT-A-CATH;  Surgeon: [name], MD;  Location: [facility];  Service: General;  Laterality: Right;    FAMILY HISTORY No family history on file. The patient's parents are in their mid to late 47s. Catherine [name] had 4 brothers, 2 sisters. There is a history of prostate and brain cancer on the maternal side but no history of ovarian or  breast cancer  GYNECOLOGIC HISTORY:  Patient's last menstrual period was 06/30/2013. Menarche age 22, first live birth age 64. The patient is GX P2. She stopped having periods at age 75. She did not take hormone replacement. She never used oral contraceptives.  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 At home is just she and her "baby" Catherine [name], 36, who is going to school. Son Catherine [name], 20, works for the [employer] and lives in [place]. The patient has 2 grandchildren. She had dense a local [employer] (harvest gathering".    ADVANCED DIRECTIVES: Not in place. At the initial clinic visit the patient was given the appropriate forms to complete and notarize at her discretion.     HEALTH MAINTENANCE: Social History  Substance Use Topics  . Smoking status: Current Every Day Smoker -- 1.00 packs/day for 30 years    Types: Cigarettes  . Smokeless tobacco: Not on file  . Alcohol Use: 0.6 oz/week    1 Glasses of wine per week     Comment: daily     Colonoscopy: Never  PAP:  Bone density: Never  Lipid panel:  Allergies  Allergen Reactions  . Codeine Nausea And Vomiting  . Metronidazole Nausea And Vomiting    Current Outpatient Prescriptions  Medication Sig Dispense Refill  . anastrozole (ARIMIDEX) 1 MG tablet Take 1 tablet (1 mg total) by mouth daily. 30 tablet 3  . HYDROcodone-acetaminophen (NORCO) 10-325 MG tablet Take 1-2 tablets by mouth every 6 (six) hours as needed for moderate pain or severe pain. (Patient not taking: Reported on 11/05/2015) 20 tablet 0  . HYDROcodone-acetaminophen (NORCO/VICODIN) 5-325 MG tablet Reported on 12/18/2015  0   No current facility-administered medications for this visit.    OBJECTIVE: Middle-aged African-American woman in no acute distress Filed Vitals:   01/07/16 0936  BP: 129/87  Pulse: 83  Temp: 98.4 F (36.9 C)  Resp: 19     Body mass index is 22.25  kg/(m^2).    ECOG FS:0 - Asymptomatic  Skin: warm, dry  HEENT: sclerae anicteric, conjunctivae pink, oropharynx clear. No thrush or mucositis.  Lymph Nodes: No cervical or supraclavicular lymphadenopathy  Lungs: clear to auscultation bilaterally, no rales, wheezes, or rhonci  Heart: regular rate and rhythm  Abdomen: round, soft, non tender, positive bowel sounds  Musculoskeletal: No focal spinal tenderness, no peripheral edema  Neuro: non focal, well oriented, tear affect  Breast: deferred  LAB RESULTS:  CMP     Component Value Date/Time   NA 142 01/07/2016 0926   NA 143 07/02/2015 1850   K 4.1 01/07/2016 0926   K 3.1* 07/02/2015 1850   CL 109 07/02/2015 0630   CO2 25 01/07/2016 0926   CO2 24 07/02/2015 0630   GLUCOSE 93 01/07/2016 0926   GLUCOSE 130* 07/02/2015 1850   BUN 12.9 01/07/2016 0926   BUN 10 07/02/2015 0630   CREATININE 1.0 01/07/2016 0926   CREATININE 0.91 07/02/2015 0630   CALCIUM 10.4 01/07/2016 0926   CALCIUM 9.5 07/02/2015 0630   PROT 7.8 01/07/2016 0926   ALBUMIN 4.2 01/07/2016 0926   AST  19 01/07/2016 0926   ALT 17 01/07/2016 0926   ALKPHOS 104 01/07/2016 0926   BILITOT 0.30 01/07/2016 0926   GFRNONAA >60 07/02/2015 0630   GFRAA >60 07/02/2015 0630    INo results found for: SPEP, UPEP  Lab Results  Component Value Date   WBC 6.9 01/07/2016   NEUTROABS 4.3 01/07/2016   HGB 13.9 01/07/2016   HCT 41.6 01/07/2016   MCV 88.2 01/07/2016   PLT 264 01/07/2016      Chemistry      Component Value Date/Time   NA 142 01/07/2016 0926   NA 143 07/02/2015 1850   K 4.1 01/07/2016 0926   K 3.1* 07/02/2015 1850   CL 109 07/02/2015 0630   CO2 25 01/07/2016 0926   CO2 24 07/02/2015 0630   BUN 12.9 01/07/2016 0926   BUN 10 07/02/2015 0630   CREATININE 1.0 01/07/2016 0926   CREATININE 0.91 07/02/2015 0630      Component Value Date/Time   CALCIUM 10.4 01/07/2016 0926   CALCIUM 9.5 07/02/2015 0630   ALKPHOS 104 01/07/2016 0926   AST 19 01/07/2016  0926   ALT 17 01/07/2016 0926   BILITOT 0.30 01/07/2016 0926       No results found for: LABCA2  No components found for: LABCA125  No results for input(s): INR in the last 168 hours.  Urinalysis No results found for: COLORURINE, APPEARANCEUR, LABSPEC, PHURINE, GLUCOSEU, HGBUR, BILIRUBINUR, KETONESUR, PROTEINUR, UROBILINOGEN, NITRITE, LEUKOCYTESUR  STUDIES: No results found.  ASSESSMENT: 55 y.o. [place] woman status post left breast biopsy 06/05/2015 for a clinical T2 NX, stage II or 3 invasive ductal carcinoma, estrogen and progesterone receptor positive, with an MIB-1 of 20%, and equivocal HER-2 amplification  (a) the patient refused biopsy of a suspicious left axillary lymph node  (1) status post left mastectomy and sentinel lymph node sampling 07/01/2015 for a pT3 pN0, stage IIB invasive ductal carcinoma, grade 2, estrogen and progesterone receptor positive, HER-2 not amplified, with an MIB-1 of 30%.  (2) Oncotype DX score of 16 predicts a risk of recurrence outside the breast within 10 years of 10% if the patient's only systemic therapy is tamoxifen for 5 years. It also predicts no significant benefit from chemotherapy.  (3) adjuvant radiation completed 11/14/15  (4) tamoxifen taken from 11/07/15 to 12/17/15. Discontinued because of vaginal irritation and itchiness.  (5) anastrozole started 01/07/16  (5) bipolar disorder: The patient refuses medication at this time  (6) tobacco abuse disorder: The patient has been strongly advised to quit  PLAN: I have reassured Catherine [name] that I am going to work on getting her CT scan approved. I am optimistic given her locally advanced disease. She never had staging scans performed prior to the start of treatment. I will be speaking with our representatives in managed care.  As far as her antiestrogen therapy is concerned, she was not interested in interventions to deal with the vaginal changes that accompany tamoxifen. We discussed  anastrozole instead. She understands potential side effects include hot flashes, vaginal dryness, and joint stiffness. She is going to continue to use coconut oil PRN. She willing to give this a try for a few weeks.   Catherine [name] will return in 6 weeks for follow up. If she is still compliant with the anastrozole by this point, we will order bone density scan and begin routine follow up. She understands and agrees with this plan. She knows the goal of treatment in her case is cure. She has been encouraged to call with  any issues that might arise before her next visit here.   Total time spent with patient was 25 minutes, with greater than 50% of the time spent face to face with the patient.   Laurie Panda, NP   01/07/2016 10:10 AM

## 2016-01-07 NOTE — Telephone Encounter (Signed)
appt made and avs printed °

## 2016-01-08 LAB — CANCER ANTIGEN 27.29: CA 27.29: 37.5 U/mL (ref 0.0–38.6)

## 2016-01-14 ENCOUNTER — Telehealth: Payer: Self-pay | Admitting: *Deleted

## 2016-01-14 ENCOUNTER — Other Ambulatory Visit: Payer: Self-pay | Admitting: Nurse Practitioner

## 2016-01-14 DIAGNOSIS — R1084 Generalized abdominal pain: Secondary | ICD-10-CM

## 2016-01-14 DIAGNOSIS — C50212 Malignant neoplasm of upper-inner quadrant of left female breast: Secondary | ICD-10-CM

## 2016-01-14 DIAGNOSIS — R0781 Pleurodynia: Secondary | ICD-10-CM

## 2016-01-14 NOTE — Telephone Encounter (Signed)
Pt called requesting to talk to Gentry Fitz, NP about a prescribed medication.   Spoke with pt and offered to assist pt with question.  Pt refused and stated she only wanted to talk to [facility] only. Pt's   Phone     [phone].

## 2016-01-20 ENCOUNTER — Telehealth: Payer: Self-pay | Admitting: *Deleted

## 2016-01-20 NOTE — Telephone Encounter (Signed)
"  I'm trying to reach Catherine [name] to schedule Bone Scan."  Call transferred to central Scheduling.

## 2016-02-11 ENCOUNTER — Encounter (HOSPITAL_COMMUNITY)
Admission: RE | Admit: 2016-02-11 | Discharge: 2016-02-11 | Disposition: A | Discharge status: Home / Self Care | Payer: Medicaid Other | Source: Ambulatory Visit | Attending: Nurse Practitioner | Admitting: Nurse Practitioner

## 2016-02-11 ENCOUNTER — Ambulatory Visit (HOSPITAL_COMMUNITY)
Admission: RE | Admit: 2016-02-11 | Discharge: 2016-02-11 | Disposition: A | Discharge status: Home / Self Care | Payer: Medicaid Other | Source: Ambulatory Visit | Attending: Nurse Practitioner | Admitting: Nurse Practitioner

## 2016-02-11 DIAGNOSIS — C50212 Malignant neoplasm of upper-inner quadrant of left female breast: Secondary | ICD-10-CM | POA: Diagnosis not present

## 2016-02-11 DIAGNOSIS — R0781 Pleurodynia: Secondary | ICD-10-CM

## 2016-02-11 MED ORDER — TECHNETIUM TC 99M MEDRONATE IV KIT
25.0000 | PACK | Freq: Once | INTRAVENOUS | Status: AC | PRN
  Administered 2016-02-11: 25 via INTRAVENOUS

## 2016-02-16 ENCOUNTER — Telehealth: Payer: Self-pay | Admitting: Oncology

## 2016-02-16 NOTE — Telephone Encounter (Signed)
Patient called to reschedule 7/11 f/u to 8/17. Patient has new date/time.

## 2016-02-17 ENCOUNTER — Ambulatory Visit: Payer: Medicaid Other | Admitting: Oncology

## 2016-02-17 ENCOUNTER — Other Ambulatory Visit: Payer: Medicaid Other

## 2016-03-18 ENCOUNTER — Ambulatory Visit: Payer: Self-pay | Admitting: Radiation Oncology

## 2016-03-19 ENCOUNTER — Encounter: Payer: Self-pay | Admitting: *Deleted

## 2016-03-25 ENCOUNTER — Encounter: Payer: Self-pay | Admitting: Oncology

## 2016-03-25 ENCOUNTER — Ambulatory Visit: Payer: Medicaid Other | Admitting: Oncology

## 2016-03-26 ENCOUNTER — Telehealth: Payer: Self-pay | Admitting: Oncology

## 2016-03-26 NOTE — Telephone Encounter (Signed)
8/17 Appointment rescheduled to 9/27 per patient request. Patient missed appointment and called to reschedule. Appointment rescheduled based on earliest availability of patient due to other appointments. Patient only available in early morning hours.

## 2016-03-29 NOTE — Telephone Encounter (Signed)
error 

## 2016-05-05 ENCOUNTER — Ambulatory Visit (HOSPITAL_BASED_OUTPATIENT_CLINIC_OR_DEPARTMENT_OTHER): Payer: Medicaid Other | Admitting: Oncology

## 2016-05-05 ENCOUNTER — Ambulatory Visit (HOSPITAL_COMMUNITY)
Admission: RE | Admit: 2016-05-05 | Discharge: 2016-05-05 | Disposition: A | Discharge status: Home / Self Care | Payer: Medicaid Other | Source: Ambulatory Visit | Attending: Oncology | Admitting: Oncology

## 2016-05-05 VITALS — BP 134/86 | HR 80 | Temp 98.1°F | Resp 18 | Ht 66.0 in | Wt 142.8 lb

## 2016-05-05 DIAGNOSIS — C50212 Malignant neoplasm of upper-inner quadrant of left female breast: Secondary | ICD-10-CM

## 2016-05-05 DIAGNOSIS — F319 Bipolar disorder, unspecified: Secondary | ICD-10-CM

## 2016-05-05 DIAGNOSIS — Z170 Estrogen receptor positive status [ER+]: Secondary | ICD-10-CM | POA: Diagnosis not present

## 2016-05-05 DIAGNOSIS — M898X8 Other specified disorders of bone, other site: Secondary | ICD-10-CM | POA: Insufficient documentation

## 2016-05-05 DIAGNOSIS — Z720 Tobacco use: Secondary | ICD-10-CM | POA: Diagnosis not present

## 2016-05-05 NOTE — Progress Notes (Signed)
[place]  [phone] Fax:(336) [phone]     ID: Catherine [name] DOB: January 27, 1961  MR#: [id number]  XVQ#:[id number]  Patient Care Team: Lucianne Lei, MD as PCP - General (Family Medicine) [name], MD as Consulting Physician (General Surgery) Chauncey Cruel, MD as Consulting Physician (Oncology) Arloa Koh, MD as Consulting Physician (Radiation Oncology) Mauro Kaufmann, RN as Registered Nurse Rockwell Germany, RN as Registered Nurse PCP: Elyn Peers, MD GYN: OTHER MD:   CHIEF COMPLAINT: Locally advanced breast cancer  CURRENT TREATMENT: Observation.  BREAST CANCER HISTORY: From the original intake note:  Catherine [name] tells me she has always had many breast cysts, and that the 1 in the left breast that turned out to be cancer didn't seem any different from any of the other ones. She did notice some skin dimpling so after a few months she brought it to the attention of Dr. Criss Rosales and on 05/28/2015 she underwent bilateral diagnostic mammography with tomosynthesis and left breast ultrasonography at [facility]. The most recent mammogram prior to this was 2009. The breast density was category C. In the left breast upper inner quadrant there was a 4 cm irregular mass associated with architectural distortion and nipple retraction. By ultrasound this measured 4 cm, and had micro-lobulated margins. There was a lymph node in the left axilla with a focus of cortical thickening.  On 06/05/2015 the patient underwent biopsy of the breast mass in question. This showed ([id number] [id number]) and invasive ductal carcinoma, grade 2 or 3, estrogen receptor 95% positive, progesterone receptor 40% positive, both with strong staining intensity, with an MIB-1 of 20%, and HER-2 equivocal for amplification, the signals ratio being 1.43 but the number per cell 4.38. Additional studies are pending to clarify the HER-2 status.  Biopsy of the abnormal lymph node in the left axilla was also scheduled  for 06/05/2015 but the patient refused  The patient's subsequent history is as detailed below  INTERVAL HISTORY: Catherine [name] returns today for follow-up of her estrogen receptor positive breast cancer. In the last visit we discussed anastrozole and I put in the prescription for her but she never did started because she had such a terrible experience she says with tamoxifen earlier. It made her itch oh over. Over the anastrozole actually was started  REVIEW OF SYSTEMS: She noticed a little bit of blood in her tissue after straining bowel movement recently. That has not recurred. She also has discomfort in her right hip area. This is not new, and it comes and goes, but it does concern her. A detailed review of systems today was otherwise stable   PAST MEDICAL HISTORY: Past Medical History:  Diagnosis Date  . Anxiety    pt very anxious on phone  . Breast cancer ([place])   . Breast cancer of upper-inner quadrant of left female breast ([place]) 06/09/2015  . Depression     PAST SURGICAL HISTORY: Past Surgical History:  Procedure Laterality Date  . EVACUATION BREAST HEMATOMA Left 07/02/2015   Procedure: EVACUATION HEMATOMA LEFT Mastectomy Site;  Surgeon: Judeth Horn, MD;  Location: [place];  Service: General;  Laterality: Left;  Marland Kitchen MASTECTOMY W/ SENTINEL NODE BIOPSY Left 07/01/2015   Procedure: LEFT MASTECTOMY WITH SENTINEL LYMPH NODE BIOPSY;  Surgeon: [name], MD;  Location: [place];  Service: General;  Laterality: Left;  . PORT-A-CATH REMOVAL Right 09/04/2015   Procedure: REMOVAL PORT-A-CATH;  Surgeon: [name], MD;  Location: [place];  Service: General;  Laterality: Right;  . PORTACATH  PLACEMENT N/A 07/01/2015   Procedure: INSERTION PORT-A-CATH;  Surgeon: [name], MD;  Location: [place];  Service: General;  Laterality: N/A;    FAMILY HISTORY No family history on file. The patient's parents are in their mid to late 81s. Catherine [name] had 4 brothers, 2 sisters. There is a history of  prostate and brain cancer on the maternal side but no history of ovarian or breast cancer  GYNECOLOGIC HISTORY:  Patient's last menstrual period was 06/30/2013. Menarche age 11, first live birth age 82. The patient is GX P2. She stopped having periods at age 47. She did not take hormone replacement. She never used oral contraceptives.  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 At home is just she and her "baby" [name], 48, who is going to school. Son [place], 24, works for the [employer] and lives in [place]. The patient has 2 grandchildren. She had dense a local [employer] (harvest gathering".    ADVANCED DIRECTIVES: Not in place. At the initial clinic visit the patient was given the appropriate forms to complete and notarize at her discretion.     HEALTH MAINTENANCE: Social History  Substance Use Topics  . Smoking status: Current Every Day Smoker    Packs/day: 1.00    Years: 30.00    Types: Cigarettes  . Smokeless tobacco: Not on file  . Alcohol use 0.6 oz/week    1 Glasses of wine per week     Comment: daily     Colonoscopy: Never  PAP:  Bone density: Never  Lipid panel:  Allergies  Allergen Reactions  . Codeine Nausea And Vomiting  . Metronidazole Nausea And Vomiting    No current outpatient prescriptions on file.   No current facility-administered medications for this visit.     OBJECTIVE: Middle-aged African-American woman examined with nurse in room Vitals:   05/05/16 0952  BP: 134/86  Pulse: 80  Resp: 18  Temp: 98.1 F (36.7 C)     Body mass index is 23.05 kg/m.    ECOG FS:1 - Symptomatic but completely ambulatory  Sclerae unicteric, pupils round and equal Oropharynx clear and moist-- no thrush or other lesions No cervical or supraclavicular adenopathy Lungs no rales or rhonchi Heart regular rate and rhythm Abd soft, nontender, positive bowel sounds MSK no focal spinal  tenderness, no upper extremity lymphedema Neuro: nonfocal, well oriented, appropriate affect Breasts: The right breast is unremarkable. The left breast is status post mastectomy. There is no evidence of local recurrence. The left axilla is benign.    LAB RESULTS:  CMP     Component Value Date/Time   NA 142 01/07/2016 0926   K 4.1 01/07/2016 0926   CL 109 07/02/2015 0630   CO2 25 01/07/2016 0926   GLUCOSE 93 01/07/2016 0926   BUN 12.9 01/07/2016 0926   CREATININE 1.0 01/07/2016 0926   CALCIUM 10.4 01/07/2016 0926   PROT 7.8 01/07/2016 0926   ALBUMIN 4.2 01/07/2016 0926   AST 19 01/07/2016 0926   ALT 17 01/07/2016 0926   ALKPHOS 104 01/07/2016 0926   BILITOT 0.30 01/07/2016 0926   GFRNONAA >60 07/02/2015 0630   GFRAA >60 07/02/2015 0630    INo results found for: SPEP, UPEP  Lab Results  Component Value Date   WBC 6.9 01/07/2016   NEUTROABS 4.3 01/07/2016   HGB 13.9 01/07/2016   HCT 41.6 01/07/2016   MCV 88.2 01/07/2016   PLT  264 01/07/2016      Chemistry      Component Value Date/Time   NA 142 01/07/2016 0926   K 4.1 01/07/2016 0926   CL 109 07/02/2015 0630   CO2 25 01/07/2016 0926   BUN 12.9 01/07/2016 0926   CREATININE 1.0 01/07/2016 0926      Component Value Date/Time   CALCIUM 10.4 01/07/2016 0926   ALKPHOS 104 01/07/2016 0926   AST 19 01/07/2016 0926   ALT 17 01/07/2016 0926   BILITOT 0.30 01/07/2016 0926       No results found for: LABCA2  No components found for: LABCA125  No results for input(s): INR in the last 168 hours.  Urinalysis No results found for: COLORURINE, APPEARANCEUR, LABSPEC, PHURINE, GLUCOSEU, HGBUR, BILIRUBINUR, KETONESUR, PROTEINUR, UROBILINOGEN, NITRITE, LEUKOCYTESUR  STUDIES: No results found.  ASSESSMENT: 55 y.o. [place] woman status post left breast upper inner quadrant biopsy 06/05/2015 for a clinical T2 NX, stage II or 3 invasive ductal carcinoma, estrogen and progesterone receptor positive, with an MIB-1 of  20%, and equivocal HER-2 amplification  (a) the patient refused biopsy of a suspicious left axillary lymph node  (1) status post left mastectomy and sentinel lymph node sampling 07/01/2015 for a pT3 pN0, stage IIB invasive ductal carcinoma, grade 2, estrogen and progesterone receptor positive, HER-2 not amplified, with an MIB-1 of 30%.  (2) Oncotype DX score of 16 predicts a risk of recurrence outside the breast within 10 years of 10% if the patient's only systemic therapy is tamoxifen for 5 years. It also predicts no significant benefit from chemotherapy.  (3) adjuvant radiation completed 11/14/15  (4) tamoxifen taken from 11/07/15 to 12/17/15. Discontinued because of vaginal irritation and itchiness.  (5) anastrozole prescribed 01/07/16, never started by patient   (5) bipolar disorder: The patient refuses medication at this time  (6) tobacco abuse disorder: The patient has been strongly advised to quit  PLAN: Flecia will soon be 1 year out from definitive surgery for her breast cancer, with no evidence of disease recurrence. This is favorable.  She will be due for mammography next month and I went ahead and placed that order.  She has intermittent right hip pain. This is unlikely to be related to her cancer but we are going to obtain plain films of this area it will serve as a baseline.  Demetri remains hyper alert and very suspicious regarding for example changes in our office like the fact that we now have a new phone system and the fact that her medication list was presented yesterday and stay off today. She is not interested in taking anti-estrogens. She feels it was terrible when she took tamoxifen because she is still on and she does not want to take a chance on another anti-estrogens cause her her similar or worse problems.  Accordingly we continue to follow her with observation alone. She will see me again in 6 months. She knows to call for any problems that may develop before that  visit.  Chauncey Cruel, MD   05/05/2016 10:02 AM

## 2016-06-18 ENCOUNTER — Other Ambulatory Visit: Payer: Self-pay | Admitting: Family Medicine

## 2016-06-18 ENCOUNTER — Other Ambulatory Visit (HOSPITAL_COMMUNITY)
Admission: RE | Admit: 2016-06-18 | Discharge: 2016-06-18 | Disposition: A | Discharge status: Home / Self Care | Payer: Medicaid Other | Source: Ambulatory Visit | Attending: Family Medicine | Admitting: Family Medicine

## 2016-06-18 DIAGNOSIS — Z113 Encounter for screening for infections with a predominantly sexual mode of transmission: Secondary | ICD-10-CM | POA: Insufficient documentation

## 2016-06-18 DIAGNOSIS — Z01419 Encounter for gynecological examination (general) (routine) without abnormal findings: Secondary | ICD-10-CM | POA: Insufficient documentation

## 2016-06-18 DIAGNOSIS — Z1151 Encounter for screening for human papillomavirus (HPV): Secondary | ICD-10-CM | POA: Insufficient documentation

## 2016-06-22 ENCOUNTER — Encounter: Payer: Self-pay | Admitting: Oncology

## 2016-06-23 LAB — URINE CYTOLOGY ANCILLARY ONLY
Candida vaginitis: NEGATIVE
Chlamydia: NEGATIVE
Neisseria Gonorrhea: NEGATIVE
Trichomonas: NEGATIVE

## 2016-06-24 LAB — CYTOLOGY - PAP
Diagnosis: NEGATIVE
HPV: NOT DETECTED

## 2016-08-16 ENCOUNTER — Ambulatory Visit (HOSPITAL_BASED_OUTPATIENT_CLINIC_OR_DEPARTMENT_OTHER): Payer: Medicaid Other | Admitting: Oncology

## 2016-08-16 VITALS — BP 149/93 | HR 89 | Temp 98.4°F | Resp 18 | Ht 66.0 in | Wt 143.4 lb

## 2016-08-16 DIAGNOSIS — Z720 Tobacco use: Secondary | ICD-10-CM

## 2016-08-16 DIAGNOSIS — C50212 Malignant neoplasm of upper-inner quadrant of left female breast: Secondary | ICD-10-CM

## 2016-08-16 DIAGNOSIS — F3160 Bipolar disorder, current episode mixed, unspecified: Secondary | ICD-10-CM

## 2016-08-16 DIAGNOSIS — F17200 Nicotine dependence, unspecified, uncomplicated: Secondary | ICD-10-CM

## 2016-08-16 DIAGNOSIS — Z170 Estrogen receptor positive status [ER+]: Secondary | ICD-10-CM

## 2016-08-16 MED ORDER — LORATADINE 10 MG PO TABS
10.0000 mg | ORAL_TABLET | Freq: Every day | ORAL | Status: DC
  Filled 2016-08-16: qty 1

## 2016-08-16 NOTE — Progress Notes (Signed)
[place]  [phone] [phone]     ID: Catherine [name] DOB: [birth year]  MR#: [id number]  GYB#:[id number]  Patient Care Team: Lucianne Lei, MD as PCP - General (Family Medicine) [name], MD as Consulting Physician (General Surgery) Chauncey Cruel, MD as Consulting Physician (Oncology) Arloa Koh, MD as Consulting Physician (Radiation Oncology) Mauro Kaufmann, RN as Registered Nurse Rockwell Germany, RN as Registered Nurse PCP: Elyn Peers, MD GYN: OTHER MD:   CHIEF COMPLAINT: Locally advanced breast cancer  CURRENT TREATMENT: Observation.  BREAST CANCER HISTORY: From the original intake note:  Catherine [name] tells me she has always had many breast cysts, and that the 1 in the left breast that turned out to be cancer didn't seem any different from any of the other ones. She did notice some skin dimpling so after a few months she brought it to the attention of Dr. Criss Rosales and on 05/28/2015 she underwent bilateral diagnostic mammography with tomosynthesis and left breast ultrasonography at [facility]. The most recent mammogram prior to this was 2009. The breast density was category C. In the left breast upper inner quadrant there was a 4 cm irregular mass associated with architectural distortion and nipple retraction. By ultrasound this measured 4 cm, and had micro-lobulated margins. There was a lymph node in the left axilla with a focus of cortical thickening.  On 06/05/2015 the patient underwent biopsy of the breast mass in question. This showed (SAA [id number]) and invasive ductal carcinoma, grade 2 or 3, estrogen receptor 95% positive, progesterone receptor 40% positive, both with strong staining intensity, with an MIB-1 of 20%, and HER-2 equivocal for amplification, the signals ratio being 1.43 but the number per cell 4.38. Additional studies are pending to clarify the HER-2 status.  Biopsy of the abnormal lymph node in the left axilla was also scheduled  for 06/05/2015 but the patient refused  The patient's subsequent history is as detailed below  INTERVAL HISTORY: Catherine [name] returns today for follow-up of her locally advanced breast cancer, which is at high risk for recurrence given the fact that she was not able to take systemic therapy. The reason she came today, she tells me, is a bump that has developed in her right neck area. She is very worried about this.  She tells me she had her mammography at [facility] in October 2017. They told her all she had was fibrocystic change but that she needed to have a six-month follow-up and that is controlled for March 2018.  She had a Pap smear in September. This was very painful and she is very upset that it hurt that much.  REVIEW OF SYSTEMS: Catherine [name] tells me most of the time she sticks around the house and read survival. This helps particularly when she gets anxious like now. She has had no fever, bleeding, rash, or unexplained weight loss or unexplained fatigue. However she has a little bit of a sore throat at times, and nasal drainage mostly from the right nostril. She says she has some blurry vision vertigo in the left eye last month but that it has cleared by now. Her right ear closes up sometimes. She is still smoking and says she would like to do something to stop if she could. She has had no unusual headaches, no nausea or vomiting, and no change in bowel or bladder habits. A detailed review of systems today was otherwise stable.   PAST MEDICAL HISTORY: Past Medical History:  Diagnosis Date  . Anxiety  pt very anxious on phone  . Breast cancer ([place])   . Breast cancer of upper-inner quadrant of left female breast ([place]) 06/09/2015  . Depression     PAST SURGICAL HISTORY: Past Surgical History:  Procedure Laterality Date  . EVACUATION BREAST HEMATOMA Left 07/02/2015   Procedure: EVACUATION HEMATOMA LEFT Mastectomy Site;  Surgeon: Judeth Horn, MD;  Location: [place];  Service: General;  Laterality:  Left;  Marland Kitchen MASTECTOMY W/ SENTINEL NODE BIOPSY Left 07/01/2015   Procedure: LEFT MASTECTOMY WITH SENTINEL LYMPH NODE BIOPSY;  Surgeon: [name], MD;  Location: [place];  Service: General;  Laterality: Left;  . PORT-A-CATH REMOVAL Right 09/04/2015   Procedure: REMOVAL PORT-A-CATH;  Surgeon: [name], MD;  Location: [place];  Service: General;  Laterality: Right;  . PORTACATH PLACEMENT N/A 07/01/2015   Procedure: INSERTION PORT-A-CATH;  Surgeon: [name], MD;  Location: [place];  Service: General;  Laterality: N/A;    FAMILY HISTORY No family history on file. The patient's parents are in their mid to late 32s. Catherine [name] had 4 brothers, 2 sisters. There is a history of prostate and brain cancer on the maternal side but no history of ovarian or breast cancer  GYNECOLOGIC HISTORY:  Patient's last menstrual period was 06/30/2013. Menarche age 66, first live birth age 84. The patient is GX P2. She stopped having periods at age 76. She did not take hormone replacement. She never used oral contraceptives.  SOCIAL HISTORY: (Updated January 2018). 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 At home is just she and her "baby" Catherine [name], 18, who is going to school. Son Catherine [name], 69 works for the [employer] and lives in [place]. The patient has 2 grandchildren. 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    ADVANCED DIRECTIVES: Not in place. At the initial clinic visit the patient was given the appropriate forms to complete and notarize at her discretion.     HEALTH MAINTENANCE: Social History  Substance Use Topics  . Smoking status: Current Every Day Smoker    Packs/day: 1.00    Years: 30.00    Types: Cigarettes  . Smokeless tobacco: Not on file  . Alcohol use 0.6 oz/week    1 Glasses of wine per week     Comment: daily     Colonoscopy: Never  PAP: 06/18/2016: Negative for intraepithelial lesion or  malignancy, no HPV detected  Bone density: Never  Lipid panel:  Allergies  Allergen Reactions  . Codeine Nausea And Vomiting  . Metronidazole Nausea And Vomiting    No current outpatient prescriptions on file.   Current Facility-Administered Medications  Medication Dose Route Frequency Provider Last Rate Last Dose  . loratadine (CLARITIN) tablet 10 mg  10 mg Oral Daily Chauncey Cruel, MD        OBJECTIVE: Middle-aged African-American woman Who appears stated age 56:   08/16/16 0841  BP: (!) 149/93  Pulse: 89  Resp: 18  Temp: 98.4 F (36.9 C)     Body mass index is 23.15 kg/m.    ECOG FS:1 - Symptomatic but completely ambulatory  Sclerae unicteric, pupils round and equal Oropharynx clear and moist-- very poor dentition No cervical or supraclavicular adenopathy; mild submental fullness bilaterally Lungs no rales or rhonchi Heart regular rate and rhythm Abd soft, nontender, positive bowel sounds MSK no focal spinal tenderness, no upper extremity lymphedema Neuro: nonfocal, well oriented, appropriate affect Breasts: The right breast shows no  masses or discharge. The left breast is status post mastectomy. There is no evidence of local recurrence. The left axilla is benign.  LAB RESULTS:  CMP     Component Value Date/Time   NA 142 01/07/2016 0926   K 4.1 01/07/2016 0926   CL 109 07/02/2015 0630   CO2 25 01/07/2016 0926   GLUCOSE 93 01/07/2016 0926   BUN 12.9 01/07/2016 0926   CREATININE 1.0 01/07/2016 0926   CALCIUM 10.4 01/07/2016 0926   PROT 7.8 01/07/2016 0926   ALBUMIN 4.2 01/07/2016 0926   AST 19 01/07/2016 0926   ALT 17 01/07/2016 0926   ALKPHOS 104 01/07/2016 0926   BILITOT 0.30 01/07/2016 0926   GFRNONAA >60 07/02/2015 0630   GFRAA >60 07/02/2015 0630    INo results found for: SPEP, UPEP  Lab Results  Component Value Date   WBC 6.9 01/07/2016   NEUTROABS 4.3 01/07/2016   HGB 13.9 01/07/2016   HCT 41.6 01/07/2016   MCV 88.2 01/07/2016    PLT 264 01/07/2016      Chemistry      Component Value Date/Time   NA 142 01/07/2016 0926   K 4.1 01/07/2016 0926   CL 109 07/02/2015 0630   CO2 25 01/07/2016 0926   BUN 12.9 01/07/2016 0926   CREATININE 1.0 01/07/2016 0926      Component Value Date/Time   CALCIUM 10.4 01/07/2016 0926   ALKPHOS 104 01/07/2016 0926   AST 19 01/07/2016 0926   ALT 17 01/07/2016 0926   BILITOT 0.30 01/07/2016 0926       No results found for: LABCA2  No components found for: LABCA125  No results for input(s): INR in the last 168 hours.  Urinalysis No results found for: COLORURINE, APPEARANCEUR, LABSPEC, PHURINE, GLUCOSEU, HGBUR, BILIRUBINUR, KETONESUR, PROTEINUR, UROBILINOGEN, NITRITE, LEUKOCYTESUR  STUDIES: Mammography from [facility] reviewed. Six-month follow-up scheduled with ultrasonography March 2018.  DETAILED SUMMARY: 56 y.o. [place] woman status post left breast upper inner quadrant biopsy 06/05/2015 for a clinical T2 NX, stage II or 3 invasive ductal carcinoma, estrogen and progesterone receptor positive, with an MIB-1 of 20%, and equivocal HER-2 amplification  (a) the patient refused biopsy of a suspicious left axillary lymph node  (1) status post left mastectomy and sentinel lymph node sampling 07/01/2015 for a pT3 pN0, stage IIB invasive ductal carcinoma, grade 2, estrogen and progesterone receptor positive, HER-2 not amplified, with an MIB-1 of 30%.  (2) Oncotype DX score of 16 predicts a risk of recurrence outside the breast within 10 years of 10% if the patient's only systemic therapy is tamoxifen for 5 years. It also predicts no significant benefit from chemotherapy.  (3) adjuvant radiation completed 11/14/15  (4) tamoxifen taken from 11/07/15 to 12/17/15. Discontinued because of vaginal irritation and itchiness.  (5) anastrozole prescribed 01/07/16, never started by patient   (5) bipolar disorder: The patient refuses medication at this time  (6) tobacco abuse disorder:  Discussed in detail  ASSESSMENT and PLAN: I spent approximately 30 minutes with Catherine [name] going over her multiple concerns and also going over her my concerns. At least half of the visit was spent in counseling. I reassured her that the submental fullness that she is palpating is benign. It is due to her sinus problems and to her poor dentition. I suggested she see her dentist, something she is reluctant to do, and I prescribed Claritin for her which I think will help with that problem.  We discussed her smoking problem in detail. We went into  Chantix including the possible toxicities, side effects and complications of that agent. She decided she does not want to try at this point but she may be willing to participate in one of our smoke cessation programs and I will get that information for her.  She may be losing her medications since her son is now 21. I will ask our social workers to look into that  Finally I went over the fact that she did have locally advanced disease and she is at high-risk for recurrence. She had a poor experience with tamoxifen. I assured her that anastrozole is very different and we went over the possible toxicities side effects and complications of that agent. At this point she refuses to give it a try  The good news is that she is now a little over one year out from definitive surgery for her breast cancer with no definitive evidence of recurrence.   She is going to see me again in April. She knows to call for any problems that may develop before that visit.  Chauncey Cruel, MD   08/16/2016 9:04 AM

## 2016-08-18 ENCOUNTER — Encounter: Payer: Self-pay | Admitting: *Deleted

## 2016-08-18 NOTE — Progress Notes (Signed)
[place] [facility]  Clinical Social Work was referred by Futures trader for referral to smoking cessation class and possible medicaid concerns.  Clinical Social Worker contacted patient at home to offer support and assess for needs. CSW left supportive message for pt sharing smoking cessation classes and the need to get with her DSS medicaid caseworker to help with any medicaid concerns. CSW encouraged pt to return CSW call if she had questions.   Clinical Social Work interventions:  Education and referral  Loren Racer, [place] Worker [place]  [place] Phone: [phone] Fax: [phone]

## 2016-10-13 ENCOUNTER — Ambulatory Visit: Payer: Medicaid Other | Attending: Internal Medicine

## 2016-10-28 ENCOUNTER — Ambulatory Visit (INDEPENDENT_AMBULATORY_CARE_PROVIDER_SITE_OTHER): Payer: Self-pay | Admitting: Family Medicine

## 2016-10-28 ENCOUNTER — Encounter: Payer: Self-pay | Admitting: Family Medicine

## 2016-10-28 VITALS — BP 121/85 | HR 88 | Temp 98.7°F | Resp 16 | Ht 66.0 in | Wt 148.0 lb

## 2016-10-28 DIAGNOSIS — N760 Acute vaginitis: Secondary | ICD-10-CM

## 2016-10-28 DIAGNOSIS — N898 Other specified noninflammatory disorders of vagina: Secondary | ICD-10-CM

## 2016-10-28 DIAGNOSIS — R8299 Other abnormal findings in urine: Secondary | ICD-10-CM

## 2016-10-28 DIAGNOSIS — E785 Hyperlipidemia, unspecified: Secondary | ICD-10-CM

## 2016-10-28 DIAGNOSIS — B9689 Other specified bacterial agents as the cause of diseases classified elsewhere: Secondary | ICD-10-CM

## 2016-10-28 DIAGNOSIS — G8929 Other chronic pain: Secondary | ICD-10-CM

## 2016-10-28 DIAGNOSIS — Z114 Encounter for screening for human immunodeficiency virus [HIV]: Secondary | ICD-10-CM

## 2016-10-28 DIAGNOSIS — Z170 Estrogen receptor positive status [ER+]: Secondary | ICD-10-CM

## 2016-10-28 DIAGNOSIS — R221 Localized swelling, mass and lump, neck: Secondary | ICD-10-CM

## 2016-10-28 DIAGNOSIS — R82998 Other abnormal findings in urine: Secondary | ICD-10-CM

## 2016-10-28 DIAGNOSIS — Z1159 Encounter for screening for other viral diseases: Secondary | ICD-10-CM

## 2016-10-28 DIAGNOSIS — F17200 Nicotine dependence, unspecified, uncomplicated: Secondary | ICD-10-CM

## 2016-10-28 DIAGNOSIS — C50212 Malignant neoplasm of upper-inner quadrant of left female breast: Secondary | ICD-10-CM

## 2016-10-28 DIAGNOSIS — M25562 Pain in left knee: Secondary | ICD-10-CM

## 2016-10-28 LAB — POCT URINALYSIS DIP (DEVICE)
Bilirubin Urine: NEGATIVE
Glucose, UA: NEGATIVE mg/dL
Ketones, ur: NEGATIVE mg/dL
Nitrite: NEGATIVE
PH: 5.5 (ref 5.0–8.0)
PROTEIN: NEGATIVE mg/dL
Specific Gravity, Urine: 1.02 (ref 1.005–1.030)
Urobilinogen, UA: 0.2 mg/dL (ref 0.0–1.0)

## 2016-10-28 LAB — TSH: TSH: 2.37 m[IU]/L

## 2016-10-28 MED ORDER — CLINDAMYCIN PHOSPHATE 2 % VA CREA: 1.0000 | TOPICAL_CREAM | Freq: Every day | VAGINAL | 0 refills | Status: AC

## 2016-10-28 MED FILL — CLINDAMYCIN 2% VAGINAL CRM: 2 | 7 days supply | Qty: 40 | Fill #0

## 2016-10-28 NOTE — Progress Notes (Signed)
Subjective:    Patient ID: Catherine [name], female    DOB: Nov 30, 1960, 56 y.o.   MRN: [id number]  HPI   Catherine [name], a 56 year old female with a history of breast cancer presents to establish care. [name] states that she was a patient of Dr. Lucianne Lei prior to establishing care. Patient has a history of left breast cancer and is followed by Dr. Lurline Del. She last saw Dr. Jana Hakim on 08/16/2016. Patient has invasive ductal carcinoma grade 2 or 3.  Patient says that she has a history of hyperlipidemia. She is not currently on medication regimen. She does not exercise or follow a low fat, low salt diet.Patient denies chest pain, dyspnea, fatigue, lower extremity edema, near-syncope, orthopnea and palpitations.  Today, she is complaining of malodorous urine. She says that urine has had an odor and she has had increase vaginal discharge. She is sexually active occasionally and has not changed partners over the past several years. She has a history of genital herpes, but has not changed partners.  Past Medical History:  Diagnosis Date  . Anxiety    pt very anxious on phone  . Breast cancer ([place])   . Breast cancer of upper-inner quadrant of left female breast ([place]) 06/09/2015  . Depression    Social History   Social History  . Marital status: Single    Spouse name: N/A  . Number of children: N/A  . Years of education: N/A   Occupational History  . Not on file.   Social History Main Topics  . Smoking status: Current Every Day Smoker    Packs/day: 1.00    Years: 30.00    Types: Cigarettes  . Smokeless tobacco: Never Used  . Alcohol use No     Comment: daily  . Drug use: No  . Sexual activity: Not Currently   Other Topics Concern  . Not on file   Social History Narrative  . No narrative on file   Immunization History  Administered Date(s) Administered  . Td 08/09/1997   Review of Systems  HENT: Negative.   Eyes: Negative.  Negative for photophobia and  visual disturbance.  Respiratory: Negative.  Negative for chest tightness and shortness of breath.   Cardiovascular: Negative.  Negative for chest pain, palpitations and leg swelling.  Gastrointestinal: Negative.   Endocrine: Negative.  Negative for polydipsia, polyphagia and polyuria.  Genitourinary: Positive for vaginal discharge.       Vaginal odor  Musculoskeletal: Positive for arthralgias and joint swelling.  Skin: Negative.   Allergic/Immunologic: Negative.  Negative for immunocompromised state.  Neurological: Positive for weakness.  Hematological: Negative.   Psychiatric/Behavioral: Negative.        Objective:   Physical Exam  Constitutional: She is oriented to person, place, and time. She appears well-developed.  HENT:  Head: Normocephalic and atraumatic.  Right Ear: External ear normal.  Left Ear: External ear normal.  Nose: Nose normal.  Mouth/Throat: Oropharynx is clear and moist.  Eyes: Conjunctivae and EOM are normal. Pupils are equal, round, and reactive to light. Right eye exhibits no discharge.  Neck: Normal range of motion. Neck supple. Thyromegaly present.  Cardiovascular: Normal rate, regular rhythm, normal heart sounds and intact distal pulses.  Exam reveals no gallop and no friction rub.   No murmur heard. Pulmonary/Chest: Effort normal and breath sounds normal.  Abdominal: Soft. Bowel sounds are normal.  Musculoskeletal:       Right knee: She exhibits decreased range of motion.  Left knee: She exhibits decreased range of motion and swelling.  Neurological: She is alert and oriented to person, place, and time.  Skin: Skin is warm and dry.  Psychiatric: She has a normal mood and affect. Her behavior is normal. Judgment and thought content normal.      BP 121/85 (BP Location: Left Arm, Patient Position: Sitting, Cuff Size: Normal)   Pulse 88   Temp 98.7 F (37.1 C) (Oral)   Resp 16   Ht 5\' 6"  (1.676 m)   Wt 148 lb (67.1 kg)   LMP 06/30/2013    SpO2 100%   BMI 23.89 kg/m  Assessment & Plan:  1. Hyperlipidemia LDL goal <100 The patient is asked to make an attempt to improve diet and exercise patterns to aid in medical management of this problem. -Will return for fasting lipid panel  2. Malignant neoplasm of upper-inner quadrant of left breast in female, estrogen receptor positive ([place]) Reviewed previous notes, will continue to follow up with Dr. Jana Hakim at [facility] as scheduled.   3. Vaginal odor 2-5 clue cells per HPF on wet prep.  - POCT urinalysis dip (device) - POCT Wet Prep ([place])  4. Vaginal discharge - POCT Wet Prep [facility])  5. Bacterial vaginitis - clindamycin (CLEOCIN) 2 % vaginal cream; Place 1 Applicatorful vaginally at bedtime.  Dispense: 40 g; Refill: 0 - POCT Wet Prep [employer] [facility])  6. Screening for HIV (human immunodeficiency virus) - HIV antibody (with reflex)  7. Need for hepatitis C screening test - Hepatitis C Antibody  8. Urine leukocytes - Urine culture  9. Neck fullness - TSH  10. Tobacco dependence Smoking cessation instruction/counseling given:  counseled patient on the dangers of tobacco use, advised patient to stop smoking, and reviewed strategies to maximize success  11. Chronic pain of left knee Ms. Heskett is complaining of chronic pain primarily to left knee. However, due to bilateral knee pain described as stiffness and aching, I suspect that Ms. Garrow has arthritis. May warrant additional testing and xrays to identify source of chronic knee pain. Patient does not have a payer source at this time. She is in the process of applying for assistance. Recommend that patient apply warm, moist compresses to knees and Naproxen sodium 220 mg  every 12 hours as directed.    RTC: 1 month for chronic conditions   [place] Al Decant  MSN, FNP-C [facility] [address] [place], [place] [address] [phone]

## 2016-10-29 DIAGNOSIS — F17200 Nicotine dependence, unspecified, uncomplicated: Secondary | ICD-10-CM | POA: Insufficient documentation

## 2016-10-29 DIAGNOSIS — N898 Other specified noninflammatory disorders of vagina: Secondary | ICD-10-CM | POA: Insufficient documentation

## 2016-10-29 DIAGNOSIS — B9689 Other specified bacterial agents as the cause of diseases classified elsewhere: Secondary | ICD-10-CM | POA: Insufficient documentation

## 2016-10-29 DIAGNOSIS — Z1159 Encounter for screening for other viral diseases: Secondary | ICD-10-CM | POA: Insufficient documentation

## 2016-10-29 DIAGNOSIS — N760 Acute vaginitis: Secondary | ICD-10-CM

## 2016-10-29 LAB — POCT WET PREP (WET MOUNT): TRICHOMONAS WET PREP HPF POC: ABSENT

## 2016-10-29 LAB — HEPATITIS C ANTIBODY: HCV Ab: NEGATIVE

## 2016-10-29 LAB — HIV ANTIBODY (ROUTINE TESTING W REFLEX): HIV 1&2 Ab, 4th Generation: NONREACTIVE

## 2016-10-30 LAB — URINE CULTURE: Organism ID, Bacteria: NO GROWTH

## 2016-10-30 NOTE — Patient Instructions (Addendum)
Clindamycin intravaginally before bed for 7 days for Bacterial vaginal vaginitis Will follow up by phone with laboratory results Left knee pain: will require additional studies to determine source. Apply warm, moist compresses as needed and Naproxen sodium (Aleve 220 mg) as directed for knee pain.  Follow up with Dr. Jana Hakim as previously scheduled Bacterial Vaginosis Bacterial vaginosis is an infection of the vagina. It happens when too many germs (bacteria) grow in the vagina. This infection puts you at risk for infections from sex (STIs). Treating this infection can lower your risk for some STIs. You should also treat this if you are pregnant. It can cause your baby to be born early. Follow these instructions at home: Medicines   Take over-the-counter and prescription medicines only as told by your doctor.  Take or use your antibiotic medicine as told by your doctor. Do not stop taking or using it even if you start to feel better. General instructions   If you your sexual partner is a woman, tell her that you have this infection. She needs to get treatment if she has symptoms. If you have a female partner, he does not need to be treated.  During treatment:  Avoid sex.  Do not douche.  Avoid alcohol as told.  Avoid breastfeeding as told.  Drink enough fluid to keep your pee (urine) clear or pale yellow.  Keep your vagina and butt (rectum) clean.  Wash the area with warm water every day.  Wipe from front to back after you use the toilet.  Keep all follow-up visits as told by your doctor. This is important. Preventing this condition   Do not douche.  Use only warm water to wash around your vagina.  Use protection when you have sex. This includes:  Latex condoms.  Dental dams.  Limit how many people you have sex with. It is best to only have sex with the same person (be monogamous).  Get tested for STIs. Have your partner get tested.  Wear underwear that is cotton or  lined with cotton.  Avoid tight pants and pantyhose. This is most important in summer.  Do not use any products that have nicotine or tobacco in them. These include cigarettes and e-cigarettes. If you need help quitting, ask your doctor.  Do not use illegal drugs.  Limit how much alcohol you drink. Contact a doctor if:  Your symptoms do not get better, even after you are treated.  You have more discharge or pain when you pee (urinate).  You have a fever.  You have pain in your belly (abdomen).  You have pain with sex.  Your bleed from your vagina between periods. Summary  This infection happens when too many germs (bacteria) grow in the vagina.  Treating this condition can lower your risk for some infections from sex (STIs).  You should also treat this if you are pregnant. It can cause early (premature) birth.  Do not stop taking or using your antibiotic medicine even if you start to feel better. This information is not intended to replace advice given to you by your health care provider. Make sure you discuss any questions you have with your health care provider. Document Released: 05/04/2008 Document Revised: 04/10/2016 Document Reviewed: 04/10/2016 Elsevier Interactive Patient Education  2017 Else[employer] Inc.  Bacterial Vaginosis Bacterial vaginosis is an infection of the vagina. It happens when too many germs (bacteria) grow in the vagina. This infection puts you at risk for infections from sex (STIs). Treating this infection can  lower your risk for some STIs. You should also treat this if you are pregnant. It can cause your baby to be born early. Follow these instructions at home: Medicines   Take over-the-counter and prescription medicines only as told by your doctor.  Take or use your antibiotic medicine as told by your doctor. Do not stop taking or using it even if you start to feel better. General instructions   If you your sexual partner is a woman, tell her that  you have this infection. She needs to get treatment if she has symptoms. If you have a female partner, he does not need to be treated.  During treatment:  Avoid sex.  Do not douche.  Avoid alcohol as told.  Avoid breastfeeding as told.  Drink enough fluid to keep your pee (urine) clear or pale yellow.  Keep your vagina and butt (rectum) clean.  Wash the area with warm water every day.  Wipe from front to back after you use the toilet.  Keep all follow-up visits as told by your doctor. This is important. Preventing this condition   Do not douche.  Use only warm water to wash around your vagina.  Use protection when you have sex. This includes:  Latex condoms.  Dental dams.  Limit how many people you have sex with. It is best to only have sex with the same person (be monogamous).  Get tested for STIs. Have your partner get tested.  Wear underwear that is cotton or lined with cotton.  Avoid tight pants and pantyhose. This is most important in summer.  Do not use any products that have nicotine or tobacco in them. These include cigarettes and e-cigarettes. If you need help quitting, ask your doctor.  Do not use illegal drugs.  Limit how much alcohol you drink. Contact a doctor if:  Your symptoms do not get better, even after you are treated.  You have more discharge or pain when you pee (urinate).  You have a fever.  You have pain in your belly (abdomen).  You have pain with sex.  Your bleed from your vagina between periods. Summary  This infection happens when too many germs (bacteria) grow in the vagina.  Treating this condition can lower your risk for some infections from sex (STIs).  You should also treat this if you are pregnant. It can cause early (premature) birth.  Do not stop taking or using your antibiotic medicine even if you start to feel better. This information is not intended to replace advice given to you by your health care provider. Make  sure you discuss any questions you have with your health care provider. Document Released: 05/04/2008 Document Revised: 04/10/2016 Document Reviewed: 04/10/2016 Elsevier Interactive Patient Education  2017 [employer].

## 2016-11-02 ENCOUNTER — Ambulatory Visit: Payer: Medicaid Other | Admitting: Oncology

## 2016-11-02 ENCOUNTER — Other Ambulatory Visit: Payer: Medicaid Other

## 2016-11-03 IMAGING — NM NM BONE WHOLE BODY
2 series · 2 of 2 positions shown · non-contrast
Comparison: None

Radiographic correlation:  None

CLINICAL DATA: LEFT breast cancer, BILATERAL tibial and fibular
pain, LEFT rib pain, no recent trauma or surgery

EXAM:
NUCLEAR MEDICINE WHOLE BODY BONE SCAN
TECHNIQUE: Whole body anterior and posterior images were obtained approximately
3 hours after intravenous injection of radiopharmaceutical.
RADIOPHARMACEUTICALS:  25 mCi 3echnetium-PPm MDP IV

[Series 1: wbr_bone_40 whole body · 2.66mm/px · 1 of 1 slices shown (1 of 2)]
[im 1/1]
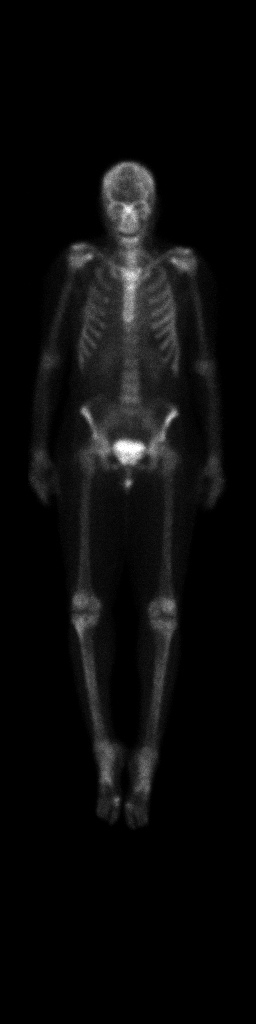

[Series 1: wbr_bone_40 whole body · 2.66mm/px · 1 of 1 slices shown (2 of 2)]
[im 1/1]
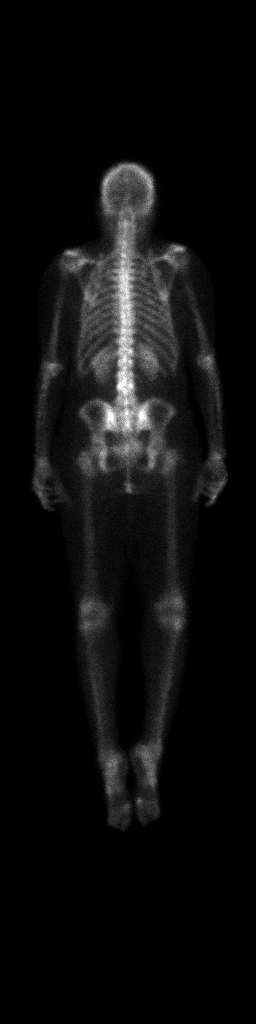

[2 of 2 positions shown; findings below may reference images not displayed]

FINDINGS: Minimal uptake in the feet, shoulders, elbows, and wrists, typically
degenerative.

No definite abnormal sites of osseous tracer accumulation are
identified to suggest osseous metastatic disease.

No significant abnormal uptake at either lower leg.

Expected urinary tract and soft tissue distribution of tracer.
IMPRESSION: No scintigraphic evidence of osseous metastatic disease.

## 2016-11-22 ENCOUNTER — Other Ambulatory Visit: Payer: Self-pay | Admitting: Adult Health

## 2016-11-22 DIAGNOSIS — C50212 Malignant neoplasm of upper-inner quadrant of left female breast: Secondary | ICD-10-CM

## 2016-11-22 DIAGNOSIS — Z170 Estrogen receptor positive status [ER+]: Principal | ICD-10-CM

## 2016-11-23 ENCOUNTER — Other Ambulatory Visit: Payer: Medicaid Other

## 2016-11-23 ENCOUNTER — Ambulatory Visit: Payer: Medicaid Other | Admitting: Oncology

## 2016-12-08 ENCOUNTER — Other Ambulatory Visit: Payer: Self-pay | Admitting: Family Medicine

## 2016-12-08 ENCOUNTER — Telehealth: Payer: Self-pay

## 2016-12-08 DIAGNOSIS — K029 Dental caries, unspecified: Secondary | ICD-10-CM

## 2016-12-08 NOTE — Telephone Encounter (Signed)
[place],  Patient is asking for a dental referral . Can this be done?

## 2016-12-08 NOTE — Telephone Encounter (Signed)
Called and spoke with patient. She has recently received [employer]. Can you put this order in. Please advise. Thanks!

## 2016-12-09 NOTE — Progress Notes (Signed)
Refill has been sent into Dental office. Thanks!

## 2017-01-19 ENCOUNTER — Telehealth (HOSPITAL_COMMUNITY): Payer: Self-pay | Admitting: *Deleted

## 2017-01-19 NOTE — Telephone Encounter (Signed)
Telephoned patient at home number and left message to return call to [facility]. Telephoned mobile number and did not answer.

## 2017-01-28 ENCOUNTER — Ambulatory Visit: Payer: Medicaid Other | Admitting: Family Medicine

## 2017-02-01 ENCOUNTER — Telehealth: Payer: Self-pay

## 2017-02-01 NOTE — Telephone Encounter (Signed)
Tried to call patient back, no answer and no voicemail.. Will re-fax referral. Thanks!

## 2017-02-01 NOTE — Telephone Encounter (Signed)
Called 02/01/2017 @12 :05pm. and left a message for [facility] to see status of referral that was faxed 12/09/2016 for this patient.

## 2017-02-24 ENCOUNTER — Encounter (HOSPITAL_COMMUNITY): Payer: Self-pay

## 2017-02-24 ENCOUNTER — Ambulatory Visit (HOSPITAL_COMMUNITY): Payer: Medicaid Other

## 2017-03-02 ENCOUNTER — Telehealth (HOSPITAL_COMMUNITY): Payer: Self-pay | Admitting: *Deleted

## 2017-03-02 NOTE — Telephone Encounter (Signed)
Patient called today with questions about the [place] program. Patient was confused about her [place] appointment on 02/24/17 at the [facility] and did not show up for her appointment. Patient had the wrong time written down. Patient stated she already had her mammogram completed at [facility] on 02/24/17. Explained to patient that it would not be covered by the [facility] program since she did not keep her appointment with [facility]. Explained to patient that she needs to be seen in [place] [facility] at the [facility] for her mammogram to be covered by the program. Let patient know she can have her mammograms completed at the [place] or [place]. Patient concerned about bills from the [place] told patient she will need to call the financial counselors at the [facility] and gave her the phone number. Patient verbalized understanding.

## 2017-03-17 ENCOUNTER — Ambulatory Visit: Payer: Medicaid Other | Admitting: Family Medicine

## 2017-03-23 ENCOUNTER — Ambulatory Visit: Payer: Medicaid Other | Admitting: Oncology

## 2017-03-23 ENCOUNTER — Other Ambulatory Visit: Payer: Medicaid Other

## 2017-03-23 ENCOUNTER — Telehealth: Payer: Self-pay | Admitting: Oncology

## 2017-03-23 NOTE — Telephone Encounter (Signed)
Patient called back to confrim and stated that she can not come in today due no transportation and she has a special needs son that needs 24 hr care.  She has r/s for 9/11 at 10:15 labs+ Dr Jana Hakim.

## 2017-03-23 NOTE — Telephone Encounter (Signed)
Patient called and wanted to make an appointment to see you.  I told her the first thing you had available was the end of Oct first of Nov.  She then told me that "when she had surgery it was swollen and had puss in it."  She is schedule to come in today at 1:30 for labs and to see Dr Jana Hakim at 2pm.  I am waiting on a call back for confirmation.

## 2017-03-25 ENCOUNTER — Ambulatory Visit (INDEPENDENT_AMBULATORY_CARE_PROVIDER_SITE_OTHER): Payer: Self-pay | Admitting: Family Medicine

## 2017-03-25 ENCOUNTER — Encounter: Payer: Self-pay | Admitting: Family Medicine

## 2017-03-25 VITALS — BP 152/90 | HR 84 | Temp 98.2°F | Resp 16 | Ht 66.0 in | Wt 152.0 lb

## 2017-03-25 DIAGNOSIS — F17210 Nicotine dependence, cigarettes, uncomplicated: Secondary | ICD-10-CM

## 2017-03-25 DIAGNOSIS — H65192 Other acute nonsuppurative otitis media, left ear: Secondary | ICD-10-CM

## 2017-03-25 DIAGNOSIS — I10 Essential (primary) hypertension: Secondary | ICD-10-CM

## 2017-03-25 DIAGNOSIS — L918 Other hypertrophic disorders of the skin: Secondary | ICD-10-CM

## 2017-03-25 MED ORDER — HYDROCHLOROTHIAZIDE 12.5 MG PO TABS: 12.5000 mg | ORAL_TABLET | Freq: Every day | ORAL | 5 refills | Status: DC

## 2017-03-25 MED ORDER — NEOMYCIN-POLYMYXIN-HC 1 % OT SOLN: 3.0000 [drp] | Freq: Four times a day (QID) | OTIC | 0 refills | Status: AC

## 2017-03-25 MED ORDER — IBUPROFEN 600 MG PO TABS: 600.0000 mg | ORAL_TABLET | Freq: Three times a day (TID) | ORAL | 0 refills | Status: DC | PRN

## 2017-03-25 NOTE — Patient Instructions (Addendum)
Apply polymyxin B/neomycin to left ear every 6 hours for 5 days.  Hypertension: Hydrochlorothiazide 12.5 mg daily for blood pressure. Return in 1 month for follow up.  Recommend a lowfat, low carbohydrate diet divided over 5-6 small meals, increase water intake to 6-8 glasses, and 150 minutes per week of cardiovascular exercise.    DASH Eating Plan DASH stands for "Dietary Approaches to Stop Hypertension." The DASH eating plan is a healthy eating plan that has been shown to reduce high blood pressure (hypertension). It may also reduce your risk for type 2 diabetes, heart disease, and stroke. The DASH eating plan may also help with weight loss. What are tips for following this plan? General guidelines  Avoid eating more than 2,300 mg (milligrams) of salt (sodium) a day. If you have hypertension, you may need to reduce your sodium intake to 1,500 mg a day.  Limit alcohol intake to no more than 1 drink a day for nonpregnant women and 2 drinks a day for men. One drink equals 12 oz of beer, 5 oz of wine, or 1 oz of hard liquor.  Work with your health care provider to maintain a healthy body weight or to lose weight. Ask what an ideal weight is for you.  Get at least 30 minutes of exercise that causes your heart to beat faster (aerobic exercise) most days of the week. Activities may include walking, swimming, or biking.  Work with your health care provider or diet and nutrition specialist (dietitian) to adjust your eating plan to your individual calorie needs. Reading food labels  Check food labels for the amount of sodium per serving. Choose foods with less than 5 percent of the Daily Value of sodium. Generally, foods with less than 300 mg of sodium per serving fit into this eating plan.  To find whole grains, look for the word "whole" as the first word in the ingredient list. Shopping  Buy products labeled as "low-sodium" or "no salt added."  Buy fresh foods. Avoid canned foods and premade  or frozen meals. Cooking  Avoid adding salt when cooking. Use salt-free seasonings or herbs instead of table salt or sea salt. Check with your health care provider or pharmacist before using salt substitutes.  Do not fry foods. Cook foods using healthy methods such as baking, boiling, grilling, and broiling instead.  Cook with heart-healthy oils, such as olive, canola, soybean, or sunflower oil. Meal planning   Eat a balanced diet that includes: ? 5 or more servings of fruits and vegetables each day. At each meal, try to fill half of your plate with fruits and vegetables. ? Up to 6-8 servings of whole grains each day. ? Less than 6 oz of lean meat, poultry, or fish each day. A 3-oz serving of meat is about the same size as a deck of cards. One egg equals 1 oz. ? 2 servings of low-fat dairy each day. ? A serving of nuts, seeds, or beans 5 times each week. ? Heart-healthy fats. Healthy fats called Omega-3 fatty acids are found in foods such as flaxseeds and coldwater fish, like sardines, salmon, and mackerel.  Limit how much you eat of the following: ? Canned or prepackaged foods. ? Food that is high in trans fat, such as fried foods. ? Food that is high in saturated fat, such as fatty meat. ? Sweets, desserts, sugary drinks, and other foods with added sugar. ? Full-fat dairy products.  Do not salt foods before eating.  Try to eat at  least 2 vegetarian meals each week.  Eat more home-cooked food and less restaurant, buffet, and fast food.  When eating at a restaurant, ask that your food be prepared with less salt or no salt, if possible. What foods are recommended? The items listed may not be a complete list. Talk with your dietitian about what dietary choices are best for you. Grains Whole-grain or whole-wheat bread. Whole-grain or whole-wheat pasta. Brown rice. [place]. Bulgur. Whole-grain and low-sodium cereals. Pita bread. Low-fat, low-sodium crackers. Whole-wheat flour  tortillas. Vegetables Fresh or frozen vegetables (raw, steamed, roasted, or grilled). Low-sodium or reduced-sodium tomato and vegetable juice. Low-sodium or reduced-sodium tomato sauce and tomato paste. Low-sodium or reduced-sodium canned vegetables. Fruits All fresh, dried, or frozen fruit. Canned fruit in natural juice (without added sugar). Meat and other protein foods Skinless chicken or [place]. Ground chicken or [place]. Pork with fat trimmed off. Fish and seafood. Egg whites. Dried beans, peas, or lentils. Unsalted nuts, nut butters, and seeds. Unsalted canned beans. Lean cuts of beef with fat trimmed off. Low-sodium, lean deli meat. Dairy Low-fat (1%) or fat-free (skim) milk. Fat-free, low-fat, or reduced-fat cheeses. Nonfat, low-sodium ricotta or cottage cheese. Low-fat or nonfat yogurt. Low-fat, low-sodium cheese. Fats and oils Soft margarine without trans fats. Vegetable oil. Low-fat, reduced-fat, or light mayonnaise and salad dressings (reduced-sodium). Canola, safflower, olive, soybean, and sunflower oils. Avocado. Seasoning and other foods Herbs. Spices. Seasoning mixes without salt. Unsalted popcorn and pretzels. Fat-free sweets. What foods are not recommended? The items listed may not be a complete list. Talk with your dietitian about what dietary choices are best for you. Grains Baked goods made with fat, such as croissants, muffins, or some breads. Dry pasta or rice meal packs. Vegetables Creamed or fried vegetables. Vegetables in a cheese sauce. Regular canned vegetables (not low-sodium or reduced-sodium). Regular canned tomato sauce and paste (not low-sodium or reduced-sodium). Regular tomato and vegetable juice (not low-sodium or reduced-sodium). Angie Fava. Olives. Fruits Canned fruit in a light or heavy syrup. Fried fruit. Fruit in cream or butter sauce. Meat and other protein foods Fatty cuts of meat. Ribs. Fried meat. Berniece Salines. Sausage. Bologna and other processed lunch meats.  Salami. Fatback. Hotdogs. Bratwurst. Salted nuts and seeds. Canned beans with added salt. Canned or smoked fish. Whole eggs or egg yolks. Chicken or [place] with skin. Dairy Whole or 2% milk, cream, and half-and-half. Whole or full-fat cream cheese. Whole-fat or sweetened yogurt. Full-fat cheese. Nondairy creamers. Whipped toppings. Processed cheese and cheese spreads. Fats and oils Butter. Stick margarine. Lard. Shortening. Ghee. Bacon fat. Tropical oils, such as coconut, palm kernel, or palm oil. Seasoning and other foods Salted popcorn and pretzels. Onion salt, garlic salt, seasoned salt, table salt, and sea salt. Worcestershire sauce. Tartar sauce. Barbecue sauce. Teriyaki sauce. Soy sauce, including reduced-sodium. Steak sauce. Canned and packaged gravies. Fish sauce. Oyster sauce. Cocktail sauce. Horseradish that you find on the shelf. Ketchup. Mustard. Meat flavorings and tenderizers. Bouillon cubes. Hot sauce and Tabasco sauce. Premade or packaged marinades. Premade or packaged taco seasonings. Relishes. Regular salad dressings. Where to find more information:  National Heart, Lung, and [place]: https://wilson-eaton.com/  American Heart Association: www.heart.org Summary  The DASH eating plan is a healthy eating plan that has been shown to reduce high blood pressure (hypertension). It may also reduce your risk for type 2 diabetes, heart disease, and stroke.  With the DASH eating plan, you should limit salt (sodium) intake to 2,300 mg a day. If you have hypertension, you  may need to reduce your sodium intake to 1,500 mg a day.  When on the DASH eating plan, aim to eat more fresh fruits and vegetables, whole grains, lean proteins, low-fat dairy, and heart-healthy fats.  Work with your health care provider or diet and nutrition specialist (dietitian) to adjust your eating plan to your individual calorie needs. This information is not intended to replace advice given to you by your health  care provider. Make sure you discuss any questions you have with your health care provider. Document Released: 07/15/2011 Document Revised: 07/19/2016 Document Reviewed: 07/19/2016 Elsevier Interactive Patient Education  2017 Elsevier Inc.  Hypertension Hypertension, commonly called high blood pressure, is when the force of blood pumping through the arteries is too strong. The arteries are the blood vessels that carry blood from the heart throughout the body. Hypertension forces the heart to work harder to pump blood and may cause arteries to become narrow or stiff. Having untreated or uncontrolled hypertension can cause heart attacks, strokes, kidney disease, and other problems. A blood pressure reading consists of a higher number over a lower number. Ideally, your blood pressure should be below 120/80. The first ("top") number is called the systolic pressure. It is a measure of the pressure in your arteries as your heart beats. The second ("bottom") number is called the diastolic pressure. It is a measure of the pressure in your arteries as the heart relaxes. What are the causes? The cause of this condition is not known. What increases the risk? Some risk factors for high blood pressure are under your control. Others are not. Factors you can change  Smoking.  Having type 2 diabetes mellitus, high cholesterol, or both.  Not getting enough exercise or physical activity.  Being overweight.  Having too much fat, sugar, calories, or salt (sodium) in your diet.  Drinking too much alcohol. Factors that are difficult or impossible to change  Having chronic kidney disease.  Having a family history of high blood pressure.  Age. Risk increases with age.  Race. You may be at higher risk if you are African-American.  Gender. Men are at higher risk than women before age 28. After age 14, women are at higher risk than men.  Having obstructive sleep apnea.  Stress. What are the signs or  symptoms? Extremely high blood pressure (hypertensive crisis) may cause:  Headache.  Anxiety.  Shortness of breath.  Nosebleed.  Nausea and vomiting.  Severe chest pain.  Jerky movements you cannot control (seizures).  How is this diagnosed? This condition is diagnosed by measuring your blood pressure while you are seated, with your arm resting on a surface. The cuff of the blood pressure monitor will be placed directly against the skin of your upper arm at the level of your heart. It should be measured at least twice using the same arm. Certain conditions can cause a difference in blood pressure between your right and left arms. Certain factors can cause blood pressure readings to be lower or higher than normal (elevated) for a short period of time:  When your blood pressure is higher when you are in a health care provider's office than when you are at home, this is called white coat hypertension. Most people with this condition do not need medicines.  When your blood pressure is higher at home than when you are in a health care provider's office, this is called masked hypertension. Most people with this condition may need medicines to control blood pressure.  If you have  a high blood pressure reading during one visit or you have normal blood pressure with other risk factors:  You may be asked to return on a different day to have your blood pressure checked again.  You may be asked to monitor your blood pressure at home for 1 week or longer.  If you are diagnosed with hypertension, you may have other blood or imaging tests to help your health care provider understand your overall risk for other conditions. How is this treated? This condition is treated by making healthy lifestyle changes, such as eating healthy foods, exercising more, and reducing your alcohol intake. Your health care provider may prescribe medicine if lifestyle changes are not enough to get your blood pressure  under control, and if:  Your systolic blood pressure is above 130.  Your diastolic blood pressure is above 80.  Your personal target blood pressure may vary depending on your medical conditions, your age, and other factors. Follow these instructions at home: Eating and drinking  Eat a diet that is high in fiber and potassium, and low in sodium, added sugar, and fat. An example eating plan is called the DASH (Dietary Approaches to Stop Hypertension) diet. To eat this way: ? Eat plenty of fresh fruits and vegetables. Try to fill half of your plate at each meal with fruits and vegetables. ? Eat whole grains, such as whole wheat pasta, brown rice, or whole grain bread. Fill about one quarter of your plate with whole grains. ? Eat or drink low-fat dairy products, such as skim milk or low-fat yogurt. ? Avoid fatty cuts of meat, processed or cured meats, and poultry with skin. Fill about one quarter of your plate with lean proteins, such as fish, chicken without skin, beans, eggs, and tofu. ? Avoid premade and processed foods. These tend to be higher in sodium, added sugar, and fat.  Reduce your daily sodium intake. Most people with hypertension should eat less than 1,500 mg of sodium a day.  Limit alcohol intake to no more than 1 drink a day for nonpregnant women and 2 drinks a day for men. One drink equals 12 oz of beer, 5 oz of wine, or 1 oz of hard liquor. Lifestyle  Work with your health care provider to maintain a healthy body weight or to lose weight. Ask what an ideal weight is for you.  Get at least 30 minutes of exercise that causes your heart to beat faster (aerobic exercise) most days of the week. Activities may include walking, swimming, or biking.  Include exercise to strengthen your muscles (resistance exercise), such as pilates or lifting weights, as part of your weekly exercise routine. Try to do these types of exercises for 30 minutes at least 3 days a week.  Do not use any  products that contain nicotine or tobacco, such as cigarettes and e-cigarettes. If you need help quitting, ask your health care provider.  Monitor your blood pressure at home as told by your health care provider.  Keep all follow-up visits as told by your health care provider. This is important. Medicines  Take over-the-counter and prescription medicines only as told by your health care provider. Follow directions carefully. Blood pressure medicines must be taken as prescribed.  Do not skip doses of blood pressure medicine. Doing this puts you at risk for problems and can make the medicine less effective.  Ask your health care provider about side effects or reactions to medicines that you should watch for. Contact a health care  provider if:  You think you are having a reaction to a medicine you are taking.  You have headaches that keep coming back (recurring).  You feel dizzy.  You have swelling in your ankles.  You have trouble with your vision. Get help right away if:  You develop a severe headache or confusion.  You have unusual weakness or numbness.  You feel faint.  You have severe pain in your chest or abdomen.  You vomit repeatedly.  You have trouble breathing. Summary  Hypertension is when the force of blood pumping through your arteries is too strong. If this condition is not controlled, it may put you at risk for serious complications.  Your personal target blood pressure may vary depending on your medical conditions, your age, and other factors. For most people, a normal blood pressure is less than 120/80.  Hypertension is treated with lifestyle changes, medicines, or a combination of both. Lifestyle changes include weight loss, eating a healthy, low-sodium diet, exercising more, and limiting alcohol. This information is not intended to replace advice given to you by your health care provider. Make sure you discuss any questions you have with your health care  provider. Document Released: 07/26/2005 Document Revised: 06/23/2016 Document Reviewed: 06/23/2016 Elsevier Interactive Patient Education  2018 [employer].     Hydrocortisone; Neomycin; Polymyxin B ear suspension What is this medicine? HYDROCORTISONE; NEOMYCIN; and POLYMYXIN B (hye droe KOR ti sone; nee oh MYE sin; pol i MIX in B) is used to treat ear infections. This medicine may be used for other purposes; ask your health care provider or pharmacist if you have questions. COMMON BRAND NAME(S): AK-Spore HC, AK-Spore HC Otic, Antibiotic Otic, Aural, Cortisporin, Cortomycin, Duomycin-HC, Oti-Sone, Oticin HC, Otimar, Pediotic, Uad What should I tell my health care provider before I take this medicine? They need to know if you have any of these conditions: -any other active infections -chronic ear infections or fluid in the ear -perforated ear drum -an unusual or allergic reaction to hydrocortisone, neomycin, polymyxin B, sulfites, other medicines, foods, dyes, or preservatives -pregnant or trying to get pregnant -breast-feeding How should I use this medicine? This medicine is only for use in the ears. Wash your hands with soap and water. Clean your ear of any fluid that can be easily removed. Do not insert any object or swab into the ear canal. Gently warm the bottle by holding it in the hand for 1 to 2 minutes. Lie down on your side with the infected ear up. Try not to touch the tip of the dropper to your ear, fingertips, or other surface. Shake the bottle immediately before using. Squeeze the bottle gently to put the prescribed number of drops in the ear canal. Stay in this position for 30 to 60 seconds to help the drops soak into the ear. Repeat the steps for the other ear if both ears are infected. Do not use your medicine more often than directed. Finish the full course of medicine prescribed by your doctor or health care professional even if you think your condition is better. Talk to  your pediatrician regarding the use of this medicine in children. While this drug may be prescribed for selected conditions, precautions do apply. Overdosage: If you think you have taken too much of this medicine contact a poison control center or emergency room at once. NOTE: This medicine is only for you. Do not share this medicine with others. What if I miss a dose? If you miss a dose,  use it as soon as you can. If it is almost time for your next dose, use only that dose. Do not take double or extra doses. What may interact with this medicine? Interactions are not expected. Do not use other ear products without talking to your doctor or health care professional. This list may not describe all possible interactions. Give your health care provider a list of all the medicines, herbs, non-prescription drugs, or dietary supplements you use. Also tell them if you smoke, drink alcohol, or use illegal drugs. Some items may interact with your medicine. What should I watch for while using this medicine? Tell your doctor or health care professional if your ear infection does not get better in a few days. Do not use longer than 10 days unless instructed by your doctor or health care professional. If rash or allergic reaction occurs, stop the product immediately and contact your physician. It is important that you keep the infected ear(s) clean and dry. When bathing, try not to get the infected ear(s) wet. Do not go swimming unless your doctor or health care professional has told you otherwise. To prevent the spread of infection, do not share ear products, or share towels and washcloths with anyone else. What side effects may I notice from receiving this medicine? Side effects that you should report to your doctor or health care professional as soon as possible: -rash -red, itchy, dry scaly skin at the affected site -worsening ear pain Side effects that usually do not require medical attention (report to  your doctor or health care professional if they continue or are bothersome): -abnormal sensation in the ear -burning or stinging while putting the drops in the ear This list may not describe all possible side effects. Call your doctor for medical advice about side effects. You may report side effects to FDA at 1-800-FDA-1088. Where should I keep my medicine? Keep out of the reach of children. Store at room temperature between 15 and 25 degrees C (59 and 77 degrees F). Do not freeze. Throw away any unused medicine after the expiration date. NOTE: This sheet is a summary. It may not cover all possible information. If you have questions about this medicine, talk to your doctor, pharmacist, or health care provider.  2018 Elsevier/Gold Standard (2015-01-14 15:07:57)   Earache, Adult An earache, or ear pain, can be caused by many things, including:  An infection.  Ear wax buildup.  Ear pressure.  Something in the ear that should not be there (foreign body).  A sore throat.  Tooth problems.  Jaw problems.  Treatment of the earache will depend on the cause. If the cause is not clear or cannot be determined, you may need to watch your symptoms until your earache goes away or until a cause is found. Follow these instructions at home: Pay attention to any changes in your symptoms. Take these actions to help with your pain:  Take or apply over-the-counter and prescription medicines only as told by your health care provider.  If you were prescribed an antibiotic medicine, use it as told by your health care provider. Do not stop using the antibiotic even if you start to feel better.  Do not put anything in your ear other than medicine that is prescribed by your health care provider.  If directed, apply heat to the o care provider recommends, such as a moist heat pack or a heating pad. ? Place a towel between your skin and the heat source. ? Leave the  heat on for 20-30 minutes. ? Remove  the heapt if your skin turns bright red. This is especially important if you are unable to feel pain, heat, or cold. You may have a greater risk of getting burned.  If directed, put ice on the ear: ? Put ice in a plastic bag. ? Place a towel between your skin and the bag. ? Leave the ice on for 20 minutes, 2-3 times a day.  Try resting in an upright position instead of lying down. This may help to reduce pressure in your ear and relieve pain.  Chew gum if it helps to relieve your ear pain.  Treat any allergies as told by your health care provider.  Keep all follow-up visits as told by your health care provider. This is important.  Contact a health care provider if:  Your pain does not improve within 2 days.  Your earache gets worse.  You have new symptoms.  You have a fever. Get help right away if:  You have a severe headache.  You have a stiff neck.  You have trouble swallowing.  You have redness or swelling behind your ear.  You have fluid or blood coming from your ear.  You have hearing loss.  You feel dizzy. This information is not intended to replace advice given to you by your health care provider. Make sure you discuss any questions you have with your health care provider. Document Released: 03/12/2004 Document Revised: 03/23/2016 Document Reviewed: 01/19/2016 Elsevier Interactive Patient Education  [employer].

## 2017-03-26 LAB — COMPLETE METABOLIC PANEL WITH GFR
ALBUMIN: 4.7 g/dL (ref 3.6–5.1)
ALK PHOS: 129 U/L (ref 33–130)
ALT: 17 U/L (ref 6–29)
AST: 16 U/L (ref 10–35)
BUN: 10 mg/dL (ref 7–25)
CO2: 24 mmol/L (ref 20–32)
Calcium: 10.4 mg/dL (ref 8.6–10.4)
Chloride: 105 mmol/L (ref 98–110)
Creat: 0.92 mg/dL (ref 0.50–1.05)
GFR, EST NON AFRICAN AMERICAN: 70 mL/min (ref >=60)
GFR, Est African American: 80 mL/min (ref >=60)
GLUCOSE: 94 mg/dL (ref 65–99)
POTASSIUM: 4.3 mmol/L (ref 3.5–5.3)
SODIUM: 140 mmol/L (ref 135–146)
Total Bilirubin: 0.3 mg/dL (ref 0.2–1.2)
Total Protein: 7 g/dL (ref 6.1–8.1)

## 2017-03-30 NOTE — Progress Notes (Signed)
Subjective:    Patient ID: Catherine [name], female    DOB: 04/15/1961, 56 y.o.   MRN: [id number]   HPI Catherine [name], a 56 year old female with a history of hyperlipidemia presents complaining of left ear pain for greater than 1 wee. She says that left ear has been throbbing and "runny".    Middle ear fluid has been persistent for several days.  The last ear infection was several years ago. She denies headache, teeth pain, sore throat, runny nose or post nasal drip. She also denies a balance or hearing concern. She has not attempted OTC interventions to eliminate symptoms.   Past Medical History:  Diagnosis Date  . Anxiety    pt very anxious on phone  . Breast cancer ([place])   . Breast cancer of upper-inner quadrant of left female breast ([place]) 06/09/2015  . Depression    Social History   Social History  . Marital status: Single    Spouse name: N/A  . Number of children: N/A  . Years of education: N/A   Occupational History  . Not on file.   Social History Main Topics  . Smoking status: Current Every Day Smoker    Packs/day: 1.00    Years: 30.00    Types: Cigarettes  . Smokeless tobacco: Never Used  . Alcohol use No     Comment: daily  . Drug use: No  . Sexual activity: Not Currently   Other Topics Concern  . Not on file   Social History Narrative  . No narrative on file   Immunization History  Administered Date(s) Administered  . Td 08/09/1997   Allergies  Allergen Reactions  . Codeine Nausea And Vomiting  . Metronidazole Nausea And Vomiting    Review of Systems  Constitutional: Negative for unexpected weight change.  HENT: Positive for ear discharge and ear pain (left ear).   Eyes: Negative for visual disturbance.  Respiratory: Negative.   Endocrine: Negative for polydipsia, polyphagia and polyuria.  Genitourinary: Negative.   Allergic/Immunologic: Negative for immunocompromised state.  Neurological: Negative.   Hematological: Negative.    Psychiatric/Behavioral: Negative.        Objective:   Physical Exam  Constitutional: She appears well-developed and well-nourished.  HENT:  Head: Normocephalic and atraumatic.  Right Ear: External ear normal.  Left Ear: There is drainage and swelling. Tympanic membrane is erythematous.  Nose: Nose normal.  Mouth/Throat: Oropharynx is clear and moist.  Eyes: Pupils are equal, round, and reactive to light. Conjunctivae and EOM are normal.  Neck: Normal range of motion. Neck supple.  Cardiovascular: Normal rate, regular rhythm, normal heart sounds and intact distal pulses.   Pulmonary/Chest: Effort normal and breath sounds normal.  Abdominal: Soft. Bowel sounds are normal.  Skin:  Skin tag to left thigh      BP (!) 152/90 (BP Location: Left Arm, Patient Position: Sitting, Cuff Size: Normal) Comment: manually  Pulse 84   Temp 98.2 F (36.8 C) (Oral)   Resp 16   Ht 5\' 6"  (1.676 m)   Wt 152 lb (68.9 kg)   LMP 06/30/2013   SpO2 98%   BMI 24.53 kg/m  Assessment & Plan:  1. Acute MEE (middle ear effusion), left Recommend applying local heat to area Tylenol 500 mg every 6 hours as needed for mild to moderate pain Avoid cigarette smoking Refrain from applying Q-tips or objects to clean ears - NEOMYCIN-POLYMYXIN-HYDROCORTISONE (CORTISPORIN) 1 % SOLN OTIC solution; Place 3 drops into the left ear every  6 (six) hours.  Dispense: 10 mL; Refill: 0  2. Essential hypertension Blood pressure is above goal. Will start a trial of hydrochlorothiazide 12.5 mg daily.  Unable to obtain urine sample, will evaluate during follow up appointment in 1 month Will review kidney functioning as lab results become available.  - hydrochlorothiazide (HYDRODIURIL) 12.5 MG tablet; Take 1 tablet (12.5 mg total) by mouth daily.  Dispense: 30 tablet; Refill: 5 - COMPLETE METABOLIC PANEL WITH GFR  3. Skin tag, acquired Patient changed her mind about skin tag removal. She says that she is feeling anxious and  afraid and will re-schedule.   4. Tobacco dependence due to cigarettes Smoking cessation instruction/counseling given:  counseled patient on the dangers of tobacco use, advised patient to stop smoking, and reviewed strategies to maximize success     RTC: 1 month for hypertension   Donia Pounds  MSN, FNP-C [facility] [address] [place], [place] [address] [phone]

## 2017-03-31 ENCOUNTER — Ambulatory Visit: Payer: Medicaid Other | Admitting: Family Medicine

## 2017-04-05 MED FILL — NEOMYCIN-POLYMYXIN-HC EAR S: 3.5-10000-1 | 16 days supply | Qty: 10 | Fill #0

## 2017-04-05 MED FILL — ?HYDROCHLOROTHIAZIDE 12.5MG: 12.5 | 30 days supply | Qty: 30 | Fill #0

## 2017-04-05 MED FILL — IBUPROFEN 600 MG TABLET: 600 | 5 days supply | Qty: 15 | Fill #0

## 2017-04-15 ENCOUNTER — Telehealth: Payer: Self-pay | Admitting: Oncology

## 2017-04-15 NOTE — Telephone Encounter (Signed)
Rescheduled patients appt due to lack of transportation.

## 2017-04-19 ENCOUNTER — Ambulatory Visit: Payer: Medicaid Other | Admitting: Oncology

## 2017-04-19 ENCOUNTER — Other Ambulatory Visit: Payer: Medicaid Other

## 2017-06-21 ENCOUNTER — Telehealth: Payer: Self-pay

## 2017-06-21 NOTE — Telephone Encounter (Signed)
Patient called and canceled appointment for 11/14 per orders 11/13 phone que. Rescheduled for Jan.

## 2017-06-22 ENCOUNTER — Ambulatory Visit: Payer: Self-pay | Admitting: Oncology

## 2017-06-22 ENCOUNTER — Encounter (HOSPITAL_COMMUNITY): Payer: Self-pay | Admitting: *Deleted

## 2017-06-22 ENCOUNTER — Other Ambulatory Visit: Payer: No Typology Code available for payment source

## 2017-08-04 ENCOUNTER — Ambulatory Visit (HOSPITAL_COMMUNITY): Payer: No Typology Code available for payment source

## 2017-08-10 ENCOUNTER — Other Ambulatory Visit (HOSPITAL_BASED_OUTPATIENT_CLINIC_OR_DEPARTMENT_OTHER): Payer: No Typology Code available for payment source

## 2017-08-10 ENCOUNTER — Inpatient Hospital Stay: Payer: No Typology Code available for payment source | Attending: Oncology | Admitting: Oncology

## 2017-08-10 VITALS — BP 140/88 | HR 82 | Temp 98.9°F | Resp 18 | Ht 66.0 in | Wt 155.8 lb

## 2017-08-10 DIAGNOSIS — F17290 Nicotine dependence, other tobacco product, uncomplicated: Secondary | ICD-10-CM

## 2017-08-10 DIAGNOSIS — C50212 Malignant neoplasm of upper-inner quadrant of left female breast: Secondary | ICD-10-CM

## 2017-08-10 DIAGNOSIS — Z853 Personal history of malignant neoplasm of breast: Secondary | ICD-10-CM

## 2017-08-10 DIAGNOSIS — Z170 Estrogen receptor positive status [ER+]: Principal | ICD-10-CM

## 2017-08-10 LAB — COMPREHENSIVE METABOLIC PANEL
ALT: 19 U/L (ref 0–55)
AST: 15 U/L (ref 5–34)
Albumin: 4.3 g/dL (ref 3.5–5.0)
Alkaline Phosphatase: 122 U/L (ref 40–150)
Anion Gap: 7 mEq/L (ref 3–11)
BUN: 12 mg/dL (ref 7.0–26.0)
CHLORIDE: 107 meq/L (ref 98–109)
CO2: 27 meq/L (ref 22–29)
CREATININE: 0.9 mg/dL (ref 0.6–1.1)
Calcium: 10.2 mg/dL (ref 8.4–10.4)
EGFR: >60 mL/min/{1.73_m2} (ref >60)
GLUCOSE: 88 mg/dL (ref 70–140)
POTASSIUM: 4.3 meq/L (ref 3.5–5.1)
SODIUM: 142 meq/L (ref 136–145)
Total Bilirubin: 0.22 mg/dL (ref 0.20–1.20)
Total Protein: 7.5 g/dL (ref 6.4–8.3)

## 2017-08-10 LAB — CBC WITH DIFFERENTIAL/PLATELET
BASO%: 0.2 % (ref 0.0–2.0)
BASOS ABS: 0 10*3/uL (ref 0.0–0.1)
EOS ABS: 0.1 10*3/uL (ref 0.0–0.5)
EOS%: 0.9 % (ref 0.0–7.0)
HCT: 41.8 % (ref 34.8–46.6)
HGB: 13.7 g/dL (ref 11.6–15.9)
LYMPH%: 44.5 % (ref 14.0–49.7)
MCH: 29.3 pg (ref 25.1–34.0)
MCHC: 32.8 g/dL (ref 31.5–36.0)
MCV: 89.3 fL (ref 79.5–101.0)
MONO#: 0.6 10*3/uL (ref 0.1–0.9)
MONO%: 9.9 % (ref 0.0–14.0)
NEUT#: 2.6 10*3/uL (ref 1.5–6.5)
NEUT%: 44.5 % (ref 38.4–76.8)
PLATELETS: 251 10*3/uL (ref 145–400)
RBC: 4.68 10*6/uL (ref 3.70–5.45)
RDW: 15.8 % — ABNORMAL HIGH (ref 11.2–14.5)
WBC: 5.8 10*3/uL (ref 3.9–10.3)
lymph#: 2.6 10*3/uL (ref 0.9–3.3)

## 2017-08-10 NOTE — Progress Notes (Signed)
[facility]  [phone] [phone]     ID: LARKIN ALFRED DOB: [birth year]  MR#: [id number]  PYP#:[id number]  Patient Care Team: Dorena Dew, FNP as PCP - General (Family Medicine) [name], MD as Consulting Physician (General Surgery) Magrinat, Virgie Dad, MD as Consulting Physician (Oncology) Arloa Koh, MD as Consulting Physician (Radiation Oncology) Mauro Kaufmann, RN as Registered Nurse Rockwell Germany, RN as Registered Nurse PCP: Dorena Dew, FNP GYN: OTHER MD:   CHIEF COMPLAINT: Locally advanced breast cancer  CURRENT TREATMENT: Observation.  BREAST CANCER HISTORY: From the original intake note:  Kikuye tells me she has always had many breast cysts, and that the 1 in the left breast that turned out to be cancer didn't seem any different from any of the other ones. She did notice some skin dimpling so after a few months she brought it to the attention of Dr. Criss Rosales and on 05/28/2015 she underwent bilateral diagnostic mammography with tomosynthesis and left breast ultrasonography at [facility]. The most recent mammogram prior to this was 2009. The breast density was category C. In the left breast upper inner quadrant there was a 4 cm irregular mass associated with architectural distortion and nipple retraction. By ultrasound this measured 4 cm, and had micro-lobulated margins. There was a lymph node in the left axilla with a focus of cortical thickening.  On 06/05/2015 the patient underwent biopsy of the breast mass in question. This showed (SAA [id number]) and invasive ductal carcinoma, grade 2 or 3, estrogen receptor 95% positive, progesterone receptor 40% positive, both with strong staining intensity, with an MIB-1 of 20%, and HER-2 equivocal for amplification, the signals ratio being 1.43 but the number per cell 4.38. Additional studies are pending to clarify the HER-2 status.  Biopsy of the abnormal lymph node in the left axilla  was also scheduled for 06/05/2015 but the patient refused  The patient's subsequent history is as detailed below  INTERVAL HISTORY: Carita returns today for follow-up of her estrogen receptor positive breast cancer.  She continues on observation alone as she was unable or unwilling to take any antiestrogens.  Since her last visit, she underwent a unilateral right breast ultrasound on 02/24/2017 at [facility] with results showing no sonographic evidence of malignancy. The stable 57m oval complex cyst in the right breast upper outer quadrant middle depth likely represents a complex cyst and is benign. The stable 575moval complex cyst in the right breast upper outer quadrant middle depth is consistent with a complicated cyst and is benign.    REVIEW OF SYSTEMS: Asiya reports that she is glad that her labs were updated today because she has been worried about her HTN recently. She reports that she is not taking her HTN medication prescribed by Dr. HoSmith Robertecause she is worried about how it will make her feel. She reports that the last time her blood pressure was checked, it was high. She reports that no one in her family had histories of heart attack, HTN, or stroke. Other than that, she is doing well. She reports that for exercise, she walks with her parents at the park sometimes. She is also very active with her son who has special needs at home. She notes that her last mammogram was lat year, and she is due for another soon, but she is trying to get into the [facility] in which they will [facility] [place]. She denies unusual headaches, visual changes, nausea,  vomiting, or dizziness. There has been no unusual cough, phlegm production, or pleurisy. This been no change in bowel or bladder habits. She denies unexplained fatigue or unexplained weight loss, bleeding, rash, or fever. A detailed review of systems was otherwise stable.     PAST MEDICAL HISTORY: Past Medical  History:  Diagnosis Date  . Anxiety    pt very anxious on phone  . Breast cancer ([place])   . Breast cancer of upper-inner quadrant of left female breast ([place]) 06/09/2015  . Depression     PAST SURGICAL HISTORY: Past Surgical History:  Procedure Laterality Date  . EVACUATION BREAST HEMATOMA Left 07/02/2015   Procedure: EVACUATION HEMATOMA LEFT Mastectomy Site;  Surgeon: Judeth Horn, MD;  Location: [facility];  Service: General;  Laterality: Left;  Marland Kitchen MASTECTOMY W/ SENTINEL NODE BIOPSY Left 07/01/2015   Procedure: LEFT MASTECTOMY WITH SENTINEL LYMPH NODE BIOPSY;  Surgeon: [name], MD;  Location: [facility];  Service: General;  Laterality: Left;  . PORT-A-CATH REMOVAL Right 09/04/2015   Procedure: REMOVAL PORT-A-CATH;  Surgeon: [name], MD;  Location: [facility];  Service: General;  Laterality: Right;  . PORTACATH PLACEMENT N/A 07/01/2015   Procedure: INSERTION PORT-A-CATH;  Surgeon: [name], MD;  Location: [facility];  Service: General;  Laterality: N/A;    FAMILY HISTORY No family history on file. The patient's parents are in their mid to late 45s. [name] had 4 brothers, 2 sisters. There is a history of prostate and brain cancer on the maternal side but no history of ovarian or breast cancer  GYNECOLOGIC HISTORY:  Patient's last menstrual period was 06/30/2013. Menarche age 11, first live birth age 68. The patient is GX P2. She stopped having periods at age 40. She did not take hormone replacement. She never used oral contraceptives.  SOCIAL HISTORY: (Updated January 2018). 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 At home is just she and her "baby" Zachary, 57, who is going to school. Son Dallas Breeding, 57 works for the [employer] and lives in [place]. The patient has 2 grandchildren. 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    ADVANCED DIRECTIVES: Not in place. At the initial clinic visit the patient  was given the appropriate forms to complete and notarize at her discretion.     HEALTH MAINTENANCE: Social History   Tobacco Use  . Smoking status: Current Every Day Smoker    Packs/day: 1.00    Years: 30.00    Pack years: 30.00    Types: Cigarettes  . Smokeless tobacco: Never Used  Substance Use Topics  . Alcohol use: No    Alcohol/week: 0.6 oz    Types: 1 Glasses of wine per week    Comment: daily  . Drug use: No     Colonoscopy: Never  PAP: 06/18/2016: Negative for intraepithelial lesion or malignancy, no HPV detected  Bone density: Never  Lipid panel:  Allergies  Allergen Reactions  . Codeine Nausea And Vomiting  . Metronidazole Nausea And Vomiting    Current Outpatient Medications  Medication Sig Dispense Refill  . hydrochlorothiazide (HYDRODIURIL) 12.5 MG tablet Take 1 tablet (12.5 mg total) by mouth daily. (Patient not taking: Reported on 08/10/2017) 30 tablet 5   No current facility-administered medications for this visit.     OBJECTIVE: Middle-aged African-American woman in no acute distress Vitals:   08/10/17 0859  BP: 140/88  Pulse: 82  Resp: 18  Temp: 98.9 F (37.2  C)  SpO2: 100%     Body mass index is 25.15 kg/m.    ECOG FS:0 - Asymptomatic  Sclerae unicteric, pupils round and equal Oropharynx clear and moist No cervical or supraclavicular adenopathy; she has slightly prominent submandibular glands bilaterally Lungs no rales or rhonchi Heart regular rate and rhythm Abd soft, nontender, positive bowel sounds MSK no focal spinal tenderness, no upper extremity lymphedema Neuro: nonfocal, well oriented, appropriate affect Breasts: The right breast shows no lumps or any skin or nipple changes of concern.  The left breast is status post mastectomy.  There is no evidence of local recurrence.  Both axillae are benign.  LAB RESULTS:  CMP     Component Value Date/Time   NA 140 03/25/2017 1015   NA 142 01/07/2016 0926   K 4.3 03/25/2017 1015   K  4.1 01/07/2016 0926   CL 105 03/25/2017 1015   CO2 24 03/25/2017 1015   CO2 25 01/07/2016 0926   GLUCOSE 94 03/25/2017 1015   GLUCOSE 93 01/07/2016 0926   BUN 10 03/25/2017 1015   BUN 12.9 01/07/2016 0926   CREATININE 0.92 03/25/2017 1015   CREATININE 1.0 01/07/2016 0926   CALCIUM 10.4 03/25/2017 1015   CALCIUM 10.4 01/07/2016 0926   PROT 7.0 03/25/2017 1015   PROT 7.8 01/07/2016 0926   ALBUMIN 4.7 03/25/2017 1015   ALBUMIN 4.2 01/07/2016 0926   AST 16 03/25/2017 1015   AST 19 01/07/2016 0926   ALT 17 03/25/2017 1015   ALT 17 01/07/2016 0926   ALKPHOS 129 03/25/2017 1015   ALKPHOS 104 01/07/2016 0926   BILITOT 0.3 03/25/2017 1015   BILITOT 0.30 01/07/2016 0926   GFRNONAA 70 03/25/2017 1015   GFRAA 80 03/25/2017 1015    INo results found for: SPEP, UPEP  Lab Results  Component Value Date   WBC 5.8 08/10/2017   NEUTROABS 2.6 08/10/2017   HGB 13.7 08/10/2017   HCT 41.8 08/10/2017   MCV 89.3 08/10/2017   PLT 251 08/10/2017      Chemistry      Component Value Date/Time   NA 140 03/25/2017 1015   NA 142 01/07/2016 0926   K 4.3 03/25/2017 1015   K 4.1 01/07/2016 0926   CL 105 03/25/2017 1015   CO2 24 03/25/2017 1015   CO2 25 01/07/2016 0926   BUN 10 03/25/2017 1015   BUN 12.9 01/07/2016 0926   CREATININE 0.92 03/25/2017 1015   CREATININE 1.0 01/07/2016 0926      Component Value Date/Time   CALCIUM 10.4 03/25/2017 1015   CALCIUM 10.4 01/07/2016 0926   ALKPHOS 129 03/25/2017 1015   ALKPHOS 104 01/07/2016 0926   AST 16 03/25/2017 1015   AST 19 01/07/2016 0926   ALT 17 03/25/2017 1015   ALT 17 01/07/2016 0926   BILITOT 0.3 03/25/2017 1015   BILITOT 0.30 01/07/2016 0926       No results found for: LABCA2  No components found for: LABCA125  No results for input(s): INR in the last 168 hours.  Urinalysis    Component Value Date/Time   LABSPEC 1.020 10/28/2016 1050   PHURINE 5.5 10/28/2016 1050   GLUCOSEU NEGATIVE 10/28/2016 1050   HGBUR TRACE (A)  10/28/2016 1050   BILIRUBINUR NEGATIVE 10/28/2016 1050   KETONESUR NEGATIVE 10/28/2016 1050   PROTEINUR NEGATIVE 10/28/2016 1050   UROBILINOGEN 0.2 10/28/2016 1050   NITRITE NEGATIVE 10/28/2016 1050   LEUKOCYTESUR MODERATE (A) 10/28/2016 1050    STUDIES: Since her last visit, she  underwent a unilateral right breast ultrasound on 02/24/2017 at [facility] with results showing no sonographic evidence of malignancy. The stable 48m oval complex cyst in the right breast upper outer quadrant middle depth likely represents a complex cyst and is benign. The stable 640moval complex cyst in the right breast upper outer quadrant middle depth is consistent with a complicated cyst and is benign.   ASSESSMENT: [age].o. [place] woman status post left breast upper inner quadrant biopsy 06/05/2015 for a clinical T2 NX, stage II or 3 invasive ductal carcinoma, estrogen and progesterone receptor positive, with an MIB-1 of 20%, and equivocal HER-2 amplification  (a) the patient refused biopsy of a suspicious left axillary lymph node  (1) status post left mastectomy and sentinel lymph node sampling 07/01/2015 for a pT3 pN0, stage IIB invasive ductal carcinoma, grade 2, estrogen and progesterone receptor positive, HER-2 not amplified, with an MIB-1 of 30%.  (2) Oncotype DX score of 16 predicts a risk of recurrence outside the breast within 10 years of 10% if the patient's only systemic therapy is tamoxifen for 5 years. It also predicts no significant benefit from chemotherapy.  (3) adjuvant radiation completed 11/14/15  (4) tamoxifen taken from 11/07/15 to 12/17/15. Discontinued because of vaginal irritation and itchiness.  (5) anastrozole prescribed 01/07/16, never started by patient   (5) bipolar disorder: The patient refuses medication at this time  (6) tobacco abuse disorder: Discussed in detail  ASSESSMENT and PLAN: Vonceil is a little over 2 years out from definitive surgery for her breast cancer with no  evidence of disease recurrence.  This is very favorable.  She has been unable to take antiestrogens, which would of course decrease her risk of recurrence.  Nevertheless she remains very concerned regarding that possibility.  I reassured her that her slightly prominent submandibular glands are most likely due to gum problems.  She does agree that she needs to get some dental work done.  She has not yet had her mammogram.  This will likely be done later this month.  We will review those results and if necessary see her.  She may need a six-month follow-up and if so she will let usKoreanow and I would see her after that.  Otherwise I plan to see her again in February 2020 after her next mammogram a month which should be late January that year with  Today I wrote her a prescription for a bra and prosthesis.  I also encouraged her to take her hydrochlorothiazide as prescribed by her primary care physician  She knows to call for any problems that may develop before the next visit.    Magrinat, GuVirgie DadMD  08/10/17 9:16 AM Medical Oncology [facility] H[facility] [address]. [phone]  Fax. [phone] document serves as a record of services personally performed by GuLurline DelMD. It was created on his behalf by ArSheron Nightingalea trained medical scribe. The creation of this record is based on the scribe's personal observations and the provider's statements to them.   I have reviewed the above documentation for accuracy and completeness, and I agree with the above.

## 2017-08-16 ENCOUNTER — Telehealth: Payer: Self-pay | Admitting: Oncology

## 2017-08-16 NOTE — Telephone Encounter (Signed)
Spoke to patient regarding upcoming February 2020 appointments.

## 2017-08-25 ENCOUNTER — Ambulatory Visit (HOSPITAL_COMMUNITY): Payer: No Typology Code available for payment source

## 2017-09-30 ENCOUNTER — Encounter: Payer: Self-pay | Admitting: Family Medicine

## 2017-09-30 ENCOUNTER — Telehealth: Payer: Self-pay

## 2017-09-30 ENCOUNTER — Ambulatory Visit (INDEPENDENT_AMBULATORY_CARE_PROVIDER_SITE_OTHER): Payer: Self-pay | Admitting: Family Medicine

## 2017-09-30 VITALS — BP 132/86 | HR 76 | Temp 98.6°F | Resp 16 | Ht 66.0 in | Wt 153.0 lb

## 2017-09-30 DIAGNOSIS — R82998 Other abnormal findings in urine: Secondary | ICD-10-CM

## 2017-09-30 DIAGNOSIS — N898 Other specified noninflammatory disorders of vagina: Secondary | ICD-10-CM

## 2017-09-30 DIAGNOSIS — K625 Hemorrhage of anus and rectum: Secondary | ICD-10-CM

## 2017-09-30 DIAGNOSIS — K649 Unspecified hemorrhoids: Secondary | ICD-10-CM

## 2017-09-30 DIAGNOSIS — R109 Unspecified abdominal pain: Secondary | ICD-10-CM

## 2017-09-30 DIAGNOSIS — F17200 Nicotine dependence, unspecified, uncomplicated: Secondary | ICD-10-CM

## 2017-09-30 LAB — POCT URINALYSIS DIP (DEVICE)
BILIRUBIN URINE: NEGATIVE
Glucose, UA: NEGATIVE mg/dL
KETONES UR: NEGATIVE mg/dL
NITRITE: NEGATIVE
PROTEIN: NEGATIVE mg/dL
Specific Gravity, Urine: 1.02 (ref 1.005–1.030)
Urobilinogen, UA: 0.2 mg/dL (ref 0.0–1.0)
pH: 5.5 (ref 5.0–8.0)

## 2017-09-30 NOTE — Telephone Encounter (Signed)
Received VM from pt regarding she wants to know if she may need an appt with Dr Jana Hakim about a concern she has.  Called pt, no answer, left her a VM with our contact info.

## 2017-09-30 NOTE — Telephone Encounter (Signed)
Pt returned my VM and she reports that she has noticed some mucus with blood in stool and having severe pelvic pain.  She reports that she seen a nurse practitioner earlier today for this issue and is awaiting some results. Pt wanted to know due to her cancer hx, would Dr Jana Hakim want her to have the CT that he ordered previously (2016) that she was unable to get due to insurance reasons and/or have an appointment with him.  Dr Jana Hakim not in office at present and I let her know I would discuss her concerns and return her call by Monday. Pt voiced understanding.

## 2017-09-30 NOTE — Telephone Encounter (Signed)
Per Dr Jana Hakim, pt can have a lab appt to give a stool sample to rule out C-diff - (from earlier call, pt reported she was seen by NP earlier today for blood/mucus in stool and severe pelvic pain).  Called pt but no answer, left VM for pt to call office to have lab appt for this Monday so stool sample can be collected.

## 2017-09-30 NOTE — Patient Instructions (Addendum)
Will follow up by phone with any abnormal laboratory results. Increase water intake to 6-8 glasses. Recommend a balanced, high fiber diet.    Hemorrhoids Hemorrhoids are swollen veins in and around the rectum or anus. There are two types of hemorrhoids:  Internal hemorrhoids. These occur in the veins that are just inside the rectum. They may poke through to the outside and become irritated and painful.  External hemorrhoids. These occur in the veins that are outside of the anus and can be felt as a painful swelling or hard lump near the anus.  Most hemorrhoids do not cause serious problems, and they can be managed with home treatments such as diet and lifestyle changes. If home treatments do not help your symptoms, procedures can be done to shrink or remove the hemorrhoids. What are the causes? This condition is caused by increased pressure in the anal area. This pressure may result from various things, including:  Constipation.  Straining to have a bowel movement.  Diarrhea.  Pregnancy.  Obesity.  Sitting for long periods of time.  Heavy lifting or other activity that causes you to strain.  Anal sex.  What are the signs or symptoms? Symptoms of this condition include:  Pain.  Anal itching or irritation.  Rectal bleeding.  Leakage of stool (feces).  Anal swelling.  One or more lumps around the anus.  How is this diagnosed? This condition can often be diagnosed through a visual exam. Other exams or tests may also be done, such as:  Examination of the rectal area with a gloved hand (digital rectal exam).  Examination of the anal canal using a small tube (anoscope).  A blood test, if you have lost a significant amount of blood.  A test to look inside the colon (sigmoidoscopy or colonoscopy).  How is this treated? This condition can usually be treated at home. However, various procedures may be done if dietary changes, lifestyle changes, and other home treatments  do not help your symptoms. These procedures can help make the hemorrhoids smaller or remove them completely. Some of these procedures involve surgery, and others do not. Common procedures include:  Rubber band ligation. Rubber bands are placed at the base of the hemorrhoids to cut off the blood supply to them.  Sclerotherapy. Medicine is injected into the hemorrhoids to shrink them.  Infrared coagulation. A type of light energy is used to get rid of the hemorrhoids.  Hemorrhoidectomy surgery. The hemorrhoids are surgically removed, and the veins that supply them are tied off.  Stapled hemorrhoidopexy surgery. A circular stapling device is used to remove the hemorrhoids and use staples to cut off the blood supply to them.  Follow these instructions at home: Eating and drinking  Eat foods that have a lot of fiber in them, such as whole grains, beans, nuts, fruits, and vegetables. Ask your health care provider about taking products that have added fiber (fiber supplements).  Drink enough fluid to keep your urine clear or pale yellow. Managing pain and swelling  Take warm sitz baths for 20 minutes, 3-4 times a day to ease pain and discomfort.  If directed, apply ice to the affected area. Using ice packs between sitz baths may be helpful. ? Put ice in a plastic bag. ? Place a towel between your skin and the bag. ? Leave the ice on for 20 minutes, 2-3 times a day. General instructions  Take over-the-counter and prescription medicines only as told by your health care provider.  Use medicated creams  or suppositories as told.  Exercise regularly.  Go to the bathroom when you have the urge to have a bowel movement. Do not wait.  Avoid straining to have bowel movements.  Keep the anal area dry and clean. Use wet toilet paper or moist towelettes after a bowel movement.  Do not sit on the toilet for long periods of time. This increases blood pooling and pain. Contact a health care  provider if:  You have increasing pain and swelling that are not controlled by treatment or medicine.  You have uncontrolled bleeding.  You have difficulty having a bowel movement, or you are unable to have a bowel movement.  You have pain or inflammation outside the area of the hemorrhoids. This information is not intended to replace advice given to you by your health care provider. Make sure you discuss any questions you have with your health care provider. Document Released: 07/23/2000 Document Revised: 12/24/2015 Document Reviewed: 04/09/2015 Elsevier Interactive Patient Education  [employer].

## 2017-10-01 LAB — CBC
HEMATOCRIT: 42.9 % (ref 34.0–46.6)
HEMOGLOBIN: 14.3 g/dL (ref 11.1–15.9)
MCH: 29.9 pg (ref 26.6–33.0)
MCHC: 33.3 g/dL (ref 31.5–35.7)
MCV: 90 fL (ref 79–97)
Platelets: 256 10*3/uL (ref 150–379)
RBC: 4.78 x10E6/uL (ref 3.77–5.28)
RDW: 15.6 % — ABNORMAL HIGH (ref 12.3–15.4)
WBC: 6.4 10*3/uL (ref 3.4–10.8)

## 2017-10-01 LAB — BASIC METABOLIC PANEL
BUN / CREAT RATIO: 12 (ref 9–23)
BUN: 11 mg/dL (ref 6–24)
CO2: 23 mmol/L (ref 20–29)
CREATININE: 0.93 mg/dL (ref 0.57–1.00)
Calcium: 10.3 mg/dL — ABNORMAL HIGH (ref 8.7–10.2)
Chloride: 103 mmol/L (ref 96–106)
GFR calc non Af Amer: 69 mL/min/{1.73_m2} (ref >59)
GFR, EST AFRICAN AMERICAN: 79 mL/min/{1.73_m2} (ref >59)
Glucose: 94 mg/dL (ref 65–99)
Potassium: 4 mmol/L (ref 3.5–5.2)
Sodium: 142 mmol/L (ref 134–144)

## 2017-10-02 LAB — URINE CULTURE

## 2017-10-02 LAB — VAGINITIS/VAGINOSIS, DNA PROBE
Candida Species: NEGATIVE
Gardnerella vaginalis: NEGATIVE
Trichomonas vaginosis: NEGATIVE

## 2017-10-03 ENCOUNTER — Telehealth: Payer: Self-pay

## 2017-10-03 ENCOUNTER — Ambulatory Visit: Payer: No Typology Code available for payment source | Admitting: Family Medicine

## 2017-10-03 NOTE — Telephone Encounter (Signed)
Called and spoke with patient, advised that all labs were in normal range. Advised that no medications changes are needed at this time. Asked that she eat a high fiber diet and increase water intake to maintain soft stools. Informed to refrain from straining with defecation and if bleeding occurs to call and scheduled a follow up with our office. Thanks!

## 2017-10-03 NOTE — Telephone Encounter (Signed)
-----   Message from Dorena Dew, [place] sent at 10/03/2017  5:37 AM EST ----- Regarding: lab results Please inform patient that all labs are within a normal range. No medication interventions are warranted at this time. Continue to follow a high fiber diet and increase water intake in order to maintain soft stools. Refrain from straining with defecation. If rectal bleeding occurs, please follow up in office.   Thanks

## 2017-10-05 ENCOUNTER — Ambulatory Visit: Payer: No Typology Code available for payment source

## 2017-10-06 NOTE — Progress Notes (Signed)
Subjective:    Patient ID: Catherine [name], female    DOB: May 16, 1961, 57 y.o.   MRN: [id number]  Catherine [name], a 57 year old female with a history o left breast cancer presents complaining of rectal bleeding and vaginal discharge. Patient reports 2 episodes of rectal bleeding after defecation.    She has had increase vaginal discharge. She is sexually active occasionally and has not changed partners over the past several years. She has a history of genital herpes, but has not changed partners.    Rectal Bleeding   The onset was gradual. The problem occurs rarely. The problem has been resolved. The patient is experiencing no pain. The stool is described as hard. Associated symptoms include vaginal discharge. Pertinent negatives include no fever, no abdominal pain, no diarrhea, no hematemesis, no hemorrhoids, no nausea, no rectal pain, no vomiting, no hematuria, no vaginal bleeding, no chest pain, no headaches, no coughing, no difficulty breathing and no rash.   Past Medical History:  Diagnosis Date  . Anxiety    pt very anxious on phone  . Breast cancer ([place])   . Breast cancer of upper-inner quadrant of left female breast ([place]) 06/09/2015  . Depression    Social History   Socioeconomic History  . Marital status: Single    Spouse name: Not on file  . Number of children: Not on file  . Years of education: Not on file  . Highest education level: Not on file  Social Needs  . Financial resource strain: Not on file  . Food insecurity - worry: Not on file  . Food insecurity - inability: Not on file  . Transportation needs - medical: Not on file  . Transportation needs - non-medical: Not on file  Occupational History  . Not on file  Tobacco Use  . Smoking status: Current Every Day Smoker    Packs/day: 1.00    Years: 30.00    Pack years: 30.00    Types: Cigarettes  . Smokeless tobacco: Never Used  Substance and Sexual Activity  . Alcohol use: No    Alcohol/week: 0.6 oz     Types: 1 Glasses of wine per week    Comment: daily  . Drug use: No  . Sexual activity: Not Currently  Other Topics Concern  . Not on file  Social History Narrative  . Not on file   Immunization History  Administered Date(s) Administered  . Td 08/09/1997   Review of Systems  Constitutional: Negative for fever.  HENT: Negative.   Eyes: Negative.  Negative for photophobia and visual disturbance.  Respiratory: Negative.  Negative for cough, chest tightness and shortness of breath.   Cardiovascular: Negative.  Negative for chest pain, palpitations and leg swelling.  Gastrointestinal: Positive for hematochezia. Negative for abdominal pain, diarrhea, hematemesis, hemorrhoids, nausea, rectal pain and vomiting.  Endocrine: Negative.  Negative for polydipsia, polyphagia and polyuria.  Genitourinary: Positive for vaginal discharge. Negative for hematuria and vaginal bleeding.       Vaginal odor  Musculoskeletal: Positive for joint swelling.  Skin: Negative.  Negative for rash.  Allergic/Immunologic: Negative.  Negative for immunocompromised state.  Neurological: Negative for headaches.  Hematological: Negative.   Psychiatric/Behavioral: Negative.        Objective:   Physical Exam  Constitutional: She is oriented to person, place, and time. She appears well-developed.  HENT:  Head: Normocephalic and atraumatic.  Right Ear: External ear normal.  Left Ear: External ear normal.  Nose: Nose normal.  Mouth/Throat: Oropharynx  is clear and moist.  Eyes: Conjunctivae and EOM are normal. Pupils are equal, round, and reactive to light. Right eye exhibits no discharge.  Neck: Normal range of motion. Neck supple.  Cardiovascular: Normal rate, regular rhythm, normal heart sounds and intact distal pulses. Exam reveals no gallop and no friction rub.  No murmur heard. Pulmonary/Chest: Effort normal and breath sounds normal.  Abdominal: Soft. Bowel sounds are normal.  Genitourinary: Rectal exam  shows internal hemorrhoid. Rectal exam shows no mass, no tenderness, anal tone normal and guaiac negative stool.  Musculoskeletal:       Right knee: She exhibits decreased range of motion.       Left knee: She exhibits decreased range of motion and swelling.  Neurological: She is alert and oriented to person, place, and time.  Skin: Skin is warm and dry.  Psychiatric: She has a normal mood and affect. Her behavior is normal. Judgment and thought content normal.      BP 132/86 (BP Location: Right Arm, Patient Position: Sitting, Cuff Size: Normal) Comment: manually  Pulse 76   Temp 98.6 F (37 C) (Oral)   Resp 16   Ht 5\' 6"  (1.676 m)   Wt 153 lb (69.4 kg)   LMP 06/30/2013   SpO2 100%   BMI 24.69 kg/m  Assessment & Plan:  1. Abdominal pain, unspecified abdominal location Patient is complaining of periodic abdominal tenderness primarily to LLQ. Will follow up after reviewing laboratory results.  - CBC - Basic Metabolic Panel  2. Rectal bleeding Patient experience rectal bleeding after wiping. I suspect that it is related to small internal hemorrhoid.  - IFOBT POC (occult bld, rslt in office); Future  3. Tobacco dependence Smoking cessation instruction/counseling given:  counseled patient on the dangers of tobacco use, advised patient to stop smoking, and reviewed strategies to maximize success  4. Hemorrhoids, unspecified hemorrhoid type Patient has internal hemorroid. No current rectal bleeding. Recommend high fiber diet with plenty of fluids (up to 8 glasses of water daily) is suggested to relieve these symptoms.  Metamucil, 1 tablespoon once or twice daily can be used to keep bowels regular if needed.  5. Urine leukocytes - Urine Culture  6. Vaginal discharge - Vaginitis/Vaginosis, DNA Probe   RTC: Follow up as previously scheduled   Donia Pounds  MSN, FNP-C Patient [facility] [address] [place], [place]  [address] [phone]

## 2017-10-20 ENCOUNTER — Ambulatory Visit (HOSPITAL_COMMUNITY): Payer: No Typology Code available for payment source

## 2017-11-28 ENCOUNTER — Telehealth: Payer: Self-pay

## 2017-12-08 ENCOUNTER — Encounter: Payer: Self-pay | Admitting: Family Medicine

## 2017-12-08 ENCOUNTER — Ambulatory Visit (INDEPENDENT_AMBULATORY_CARE_PROVIDER_SITE_OTHER): Payer: Self-pay | Admitting: Family Medicine

## 2017-12-08 VITALS — BP 138/85 | HR 87 | Temp 98.9°F | Resp 14 | Ht 66.0 in | Wt 150.0 lb

## 2017-12-08 DIAGNOSIS — F17200 Nicotine dependence, unspecified, uncomplicated: Secondary | ICD-10-CM

## 2017-12-08 DIAGNOSIS — I10 Essential (primary) hypertension: Secondary | ICD-10-CM

## 2017-12-08 DIAGNOSIS — R42 Dizziness and giddiness: Secondary | ICD-10-CM

## 2017-12-08 DIAGNOSIS — M542 Cervicalgia: Secondary | ICD-10-CM

## 2017-12-08 DIAGNOSIS — N898 Other specified noninflammatory disorders of vagina: Secondary | ICD-10-CM

## 2017-12-08 LAB — POCT URINALYSIS DIPSTICK
Bilirubin, UA: NEGATIVE
Glucose, UA: NEGATIVE
KETONES UA: NEGATIVE
Leukocytes, UA: NEGATIVE
NITRITE UA: NEGATIVE
PROTEIN UA: NEGATIVE
Spec Grav, UA: 1.025 (ref 1.010–1.025)
Urobilinogen, UA: 0.2 E.U./dL
pH, UA: 5.5 (ref 5.0–8.0)

## 2017-12-08 MED ORDER — HYDROCHLOROTHIAZIDE 12.5 MG PO TABS: 12.5000 mg | ORAL_TABLET | Freq: Every day | ORAL | 5 refills | Status: DC

## 2017-12-08 MED FILL — HYDROCHLOROTHIAZIDE 12.5 MG: 12.5 | 30 days supply | Qty: 30 | Fill #0

## 2017-12-08 NOTE — Progress Notes (Signed)
Subjective:    Patient ID: Catherine [name], female    DOB: 1960/11/17, 57 y.o.   MRN: [id number]  HPI A 57 year old patient with a history of left breast cancer and hypertension presents for follow-up of chronic conditions.  Patient has not been taking antihypertensive medications consistently over the past several months.  She states that she is afraid to take medications due to potential side effects.  She complains of periodic dizziness that primarily occur when she is transitioning from sitting to standing.  Patient does not monitor blood pressure at home.  She does not follow a low-sodium diet or exercise routinely.  Her body mass index is within a normal range.  She currently denies fatigue, chest pains, nausea, vomiting, diarrhea, shortness of breath, or syncope. Patient has a history of left breast cancer she follows up with Dr.Magrinot, oncology every 3 to 6 months.   Patient is also complaining of periodic white vaginal discharge.  She denies odor or vaginal itching.  Patient is not sexually active and has not been in greater than 1 year.  Past Medical History:  Diagnosis Date  . Anxiety    pt very anxious on phone  . Breast cancer ([place])   . Breast cancer of upper-inner quadrant of left female breast ([place]) 06/09/2015  . Depression    Social History   Socioeconomic History  . Marital status: Single    Spouse name: Not on file  . Number of children: Not on file  . Years of education: Not on file  . Highest education level: Not on file  Occupational History  . Not on file  Social Needs  . Financial resource strain: Not on file  . Food insecurity:    Worry: Not on file    Inability: Not on file  . Transportation needs:    Medical: Not on file    Non-medical: Not on file  Tobacco Use  . Smoking status: Current Every Day Smoker    Packs/day: 1.00    Years: 30.00    Pack years: 30.00    Types: Cigarettes  . Smokeless tobacco: Never Used  Substance and Sexual Activity   . Alcohol use: No    Alcohol/week: 0.6 oz    Types: 1 Glasses of wine per week    Comment: daily  . Drug use: No  . Sexual activity: Not Currently  Lifestyle  . Physical activity:    Days per week: Not on file    Minutes per session: Not on file  . Stress: Not on file  Relationships  . Social connections:    Talks on phone: Not on file    Gets together: Not on file    Attends religious service: Not on file    Active member of club or organization: Not on file    Attends meetings of clubs or organizations: Not on file    Relationship status: Not on file  . Intimate partner violence:    Fear of current or ex partner: Not on file    Emotionally abused: Not on file    Physically abused: Not on file    Forced sexual activity: Not on file  Other Topics Concern  . Not on file  Social History Narrative  . Not on file    Review of Systems  Constitutional: Negative.  Negative for fatigue.  HENT: Negative.   Eyes: Negative.   Respiratory: Negative.   Cardiovascular: Negative.   Gastrointestinal: Negative.   Endocrine: Negative.   Genitourinary:  Negative.        Vaginal discharge  Musculoskeletal: Positive for neck pain.  Skin:       Left thigh skin tag  Neurological: Negative.   Hematological: Negative.   Psychiatric/Behavioral: Negative.        Objective:   Physical Exam  HENT:  Head: Normocephalic and atraumatic.  Right Ear: External ear normal.  Left Ear: External ear normal.  Nose: Nose normal.  Mouth/Throat: Oropharynx is clear and moist.  Eyes: Pupils are equal, round, and reactive to light.  Neck: Normal range of motion.  Cardiovascular: Normal rate, regular rhythm and normal heart sounds.  Pulmonary/Chest: Effort normal and breath sounds normal.  Abdominal: Soft. Bowel sounds are normal.  Skin: Skin is warm and dry.       BP 138/85 (BP Location: Right Arm, Patient Position: Sitting, Cuff Size: Normal)   Pulse 87   Temp 98.9 F (37.2 C) (Oral)    Resp 14   Ht 5\' 6"  (1.676 m)   Wt 150 lb (68 kg)   LMP 06/30/2013   SpO2 100%   BMI 24.21 kg/m  Assessment & Plan:  1. Essential hypertension Blood pressure is at goal without medications.  Will restart hydrochlorothiazide 12.5 mg daily.  No proteinuria present.  Will review renal functioning as results become available.  Discussed the importance of taking medications consistently in order to achieve positive outcomes - hydrochlorothiazide (HYDRODIURIL) 12.5 MG tablet; Take 1 tablet (12.5 mg total) by mouth daily.  Dispense: 30 tablet; Refill: 5 - Basic Metabolic Panel  2. Dizziness Review orthostatic blood pressure, unremarkable - Urinalysis Dipstick - Orthostatic vital signs  3. Cervicalgia Patient complaining of periodic neck pain.  Neck pain not reproducible on physical exam.  Recommend that patient change her pillow and apply warm moist compresses as needed.  Also, recommend Tylenol 500 mg every 6 hours as needed.  4. Tobacco dependence Patient is in the pre-contemplative state 5. Vaginal discharge - Vaginitis/Vaginosis, DNA Probe   RTC: Around 12/29/2017 for skin tag removal  Donia Pounds  MSN, FNP-C [name] [address] [place], [place] [address] [phone]

## 2017-12-08 NOTE — Patient Instructions (Addendum)
Your blood pressure remains above goal, will restart hydrochlorothiazide 12.5 mg daily.  - Continue medication, monitor blood pressure at home. Continue DASH diet. Reminder to go to the ER if any CP, SOB, nausea, dizziness, severe HA, changes vision/speech, left arm numbness and tingling and jaw pain.     For neck pain, I recommend that you change her pillow.  We discussed memory foam and Tempur-Pedic pillow.  Also, apply warm moist compresses to neck as needed.   I discussed smoking cessation at length.  I recommend that you continue to work on your smoking.   We will follow-up by phone with any abnormal laboratory results.   We will follow-up in 2 weeks to remove skin tag

## 2017-12-09 LAB — BASIC METABOLIC PANEL
BUN/Creatinine Ratio: 14 (ref 9–23)
BUN: 12 mg/dL (ref 6–24)
CALCIUM: 10.4 mg/dL — AB (ref 8.7–10.2)
CHLORIDE: 103 mmol/L (ref 96–106)
CO2: 25 mmol/L (ref 20–29)
Creatinine, Ser: 0.85 mg/dL (ref 0.57–1.00)
GFR calc Af Amer: 88 mL/min/{1.73_m2} (ref >59)
GFR calc non Af Amer: 76 mL/min/{1.73_m2} (ref >59)
GLUCOSE: 95 mg/dL (ref 65–99)
Potassium: 4.2 mmol/L (ref 3.5–5.2)
Sodium: 141 mmol/L (ref 134–144)

## 2017-12-10 LAB — VAGINITIS/VAGINOSIS, DNA PROBE
CANDIDA SPECIES: NEGATIVE
GARDNERELLA VAGINALIS: NEGATIVE
Trichomonas vaginosis: NEGATIVE

## 2017-12-12 ENCOUNTER — Telehealth: Payer: Self-pay

## 2017-12-12 NOTE — Telephone Encounter (Signed)
Called and spoke with patient, advised that all labs are within a normal range. She has started hctz and I asked her to continue and to keep next scheduled appointment. Thanks!

## 2017-12-12 NOTE — Telephone Encounter (Signed)
-----   Message from Dorena Dew, [place] sent at 12/10/2017  6:10 AM EDT ----- Regarding: lab results Please inform patient that all labs are within a normal range. No additional medication changes warranted at this time. Inquire whether patient has started hydrochlorothiazide for HBP, if not pleas encourage her to pick up from pharmacy.  Follow up in office as scheduled.   Donia Pounds  MSN, FNP-C Patient [facility] [address] [place], [place] [address] [phone]

## 2017-12-14 ENCOUNTER — Telehealth: Payer: Self-pay

## 2017-12-14 NOTE — Telephone Encounter (Signed)
Called, no answer and voicemail was full.

## 2017-12-29 ENCOUNTER — Ambulatory Visit: Payer: No Typology Code available for payment source | Admitting: Family Medicine

## 2018-01-03 MED FILL — HYDROCHLOROTHIAZIDE 12.5 MG: 12.5 | 30 days supply | Qty: 30 | Fill #1

## 2018-01-06 ENCOUNTER — Encounter: Payer: Self-pay | Admitting: Family Medicine

## 2018-01-06 ENCOUNTER — Ambulatory Visit (INDEPENDENT_AMBULATORY_CARE_PROVIDER_SITE_OTHER): Payer: Self-pay | Admitting: Family Medicine

## 2018-01-06 VITALS — BP 121/86 | HR 93 | Temp 99.2°F | Resp 16 | Ht 66.0 in | Wt 152.0 lb

## 2018-01-06 DIAGNOSIS — L918 Other hypertrophic disorders of the skin: Secondary | ICD-10-CM

## 2018-01-06 NOTE — Patient Instructions (Signed)
Skin Tag, Adult A skin tag (acrochordon) is a soft, extra growth of skin. Most skin tags are flesh-colored and rarely bigger than a pencil eraser. They commonly form near areas where there are folds in the skin, such as the armpit or groin. Skin tags are not dangerous, and they do not spread from person to person (are not contagious). You may have one skin tag or several. Skin tags do not require treatment. However, your health care provider may recommend removal of a skin tag if it:  Gets irritated from clothing.  Bleeds.  Is visible and unsightly.  Your health care provider can remove skin tags with a simple surgical procedure or a procedure that involves freezing the skin tag. Follow these instructions at home:  Watch for any changes in your skin tag. A normal skin tag does not require any other special care at home.  Take over-the-counter and prescription medicines only as told by your health care provider.  Keep all follow-up visits as told by your health care provider. This is important. Contact a health care provider if:  You have a skin tag that: ? Becomes painful. ? Changes color. ? Bleeds. ? Swells.  You develop more skin tags. This information is not intended to replace advice given to you by your health care provider. Make sure you discuss any questions you have with your health care provider. Document Released: 08/10/2015 Document Revised: 03/21/2016 Document Reviewed: 08/10/2015 Elsevier Interactive Patient Education  2018 [employer]vier Inc[employer]  

## 2018-01-06 NOTE — Progress Notes (Signed)
Subjective:     Catherine [name] is a 57 y.o. female with a history of left breast cancer and hypertension who complains of skin tags. The patient wishes skin tag removed as the lesion is irritated by clothing.  She states that left inner thigh skin tag has been present for many years. She denies fever, pain, or fatigue at present.  Past Medical History:  Diagnosis Date  . Anxiety    pt very anxious on phone  . Breast cancer ([place] [facility])   . Breast cancer of upper-inner quadrant of left female breast ([place]) 06/09/2015  . Depression    Social History   Socioeconomic History  . Marital status: Single    Spouse name: Not on file  . Number of children: Not on file  . Years of education: Not on file  . Highest education level: Not on file  Occupational History  . Not on file  Social Needs  . Financial resource strain: Not on file  . Food insecurity:    Worry: Not on file    Inability: Not on file  . Transportation needs:    Medical: Not on file    Non-medical: Not on file  Tobacco Use  . Smoking status: Current Every Day Smoker    Packs/day: 1.00    Years: 30.00    Pack years: 30.00    Types: Cigarettes  . Smokeless tobacco: Never Used  Substance and Sexual Activity  . Alcohol use: No    Alcohol/week: 0.6 oz    Types: 1 Glasses of wine per week    Comment: daily  . Drug use: No  . Sexual activity: Not Currently  Lifestyle  . Physical activity:    Days per week: Not on file    Minutes per session: Not on file  . Stress: Not on file  Relationships  . Social connections:    Talks on phone: Not on file    Gets together: Not on file    Attends religious service: Not on file    Active member of club or organization: Not on file    Attends meetings of clubs or organizations: Not on file    Relationship status: Not on file  . Intimate partner violence:    Fear of current or ex partner: Not on file    Emotionally abused: Not on file    Physically abused: Not on file    Forced  sexual activity: Not on file  Other Topics Concern  . Not on file  Social History Narrative  . Not on file   Allergies  Allergen Reactions  . Codeine Nausea And Vomiting  . Metronidazole Nausea And Vomiting    Objective:  ,   Skin:  Left inner thigh skin tag was removed using lidocaine injection,  scissors and forceps after alcohol prep; hemostasis maintained and discarded.    Assessment:    Chronically irritated skin tag, removed.    Plan:    1. Acquired skin tag - Pathology   The patient is instructed to watch for signs of infection including erythema, pain,      purulent discharge, or crusting.  2. Written patient instruction given. 3. Follow up as needed for acute illness     The patient was given clear instructions to go to ER or return to medical center if symptoms do not improve, worsen or new problems develop. The patient verbalized understanding.   Donia Pounds  MSN, FNP-C Patient [facility] [phone]  [place], [place] [address] [phone] .

## 2018-01-11 LAB — PATHOLOGY

## 2018-01-24 ENCOUNTER — Ambulatory Visit (HOSPITAL_COMMUNITY): Payer: No Typology Code available for payment source

## 2018-02-06 ENCOUNTER — Other Ambulatory Visit: Payer: Self-pay

## 2018-02-06 DIAGNOSIS — I10 Essential (primary) hypertension: Secondary | ICD-10-CM

## 2018-02-06 MED ORDER — HYDROCHLOROTHIAZIDE 12.5 MG PO TABS: 12.5000 mg | ORAL_TABLET | Freq: Every day | ORAL | 2 refills | Status: DC

## 2018-02-06 MED FILL — HYDROCHLOROTHIAZIDE 12.5 MG: 12.5 | 90 days supply | Qty: 90 | Fill #2

## 2018-04-12 ENCOUNTER — Ambulatory Visit (INDEPENDENT_AMBULATORY_CARE_PROVIDER_SITE_OTHER): Payer: Self-pay | Admitting: Family Medicine

## 2018-04-12 ENCOUNTER — Encounter: Payer: Self-pay | Admitting: Family Medicine

## 2018-04-12 ENCOUNTER — Other Ambulatory Visit: Payer: Self-pay

## 2018-04-12 VITALS — BP 136/89 | HR 87 | Temp 98.9°F | Ht 66.0 in | Wt 153.0 lb

## 2018-04-12 DIAGNOSIS — R198 Other specified symptoms and signs involving the digestive system and abdomen: Secondary | ICD-10-CM

## 2018-04-12 DIAGNOSIS — I10 Essential (primary) hypertension: Secondary | ICD-10-CM

## 2018-04-12 DIAGNOSIS — N898 Other specified noninflammatory disorders of vagina: Secondary | ICD-10-CM

## 2018-04-12 DIAGNOSIS — R232 Flushing: Secondary | ICD-10-CM

## 2018-04-12 DIAGNOSIS — Z1211 Encounter for screening for malignant neoplasm of colon: Secondary | ICD-10-CM

## 2018-04-12 MED ORDER — HYDROCHLOROTHIAZIDE 12.5 MG PO TABS: 12.5000 mg | ORAL_TABLET | Freq: Every day | ORAL | 2 refills | Status: DC

## 2018-04-12 NOTE — Patient Instructions (Signed)
Colonoscopy, Adult A colonoscopy is an exam to look at the entire large intestine. During the exam, a lubricated, bendable tube is inserted into the anus and then passed into the rectum, colon, and other parts of the large intestine. A colonoscopy is often done as a part of normal colorectal screening or in response to certain symptoms, such as anemia, persistent diarrhea, abdominal pain, and blood in the stool. The exam can help screen for and diagnose medical problems, including:  Tumors.  Polyps.  Inflammation.  Areas of bleeding.  Tell a health care provider about:  Any allergies you have.  All medicines you are taking, including vitamins, herbs, eye drops, creams, and over-the-counter medicines.  Any problems you or family members have had with anesthetic medicines.  Any blood disorders you have.  Any surgeries you have had.  Any medical conditions you have.  Any problems you have had passing stool. What are the risks? Generally, this is a safe procedure. However, problems may occur, including:  Bleeding.  A tear in the intestine.  A reaction to medicines given during the exam.  Infection (rare).  What happens before the procedure? Eating and drinking restrictions Follow instructions from your health care provider about eating and drinking, which may include:  A few days before the procedure - follow a low-fiber diet. Avoid nuts, seeds, dried fruit, raw fruits, and vegetables.  1-3 days before the procedure - follow a clear liquid diet. Drink only clear liquids, such as clear broth or bouillon, black coffee or tea, clear juice, clear soft drinks or sports drinks, gelatin dessert, and popsicles. Avoid any liquids that contain red or purple dye.  On the day of the procedure - do not eat or drink anything during the 2 hours before the procedure, or within the time period that your health care provider recommends.  Bowel prep If you were prescribed an oral bowel prep  to clean out your colon:  Take it as told by your health care provider. Starting the day before your procedure, you will need to drink a large amount of medicated liquid. The liquid will cause you to have multiple loose stools until your stool is almost clear or light green.  If your skin or anus gets irritated from diarrhea, you may use these to relieve the irritation: ? Medicated wipes, such as adult wet wipes with aloe and vitamin E. ? A skin soothing-product like petroleum jelly.  If you vomit while drinking the bowel prep, take a break for up to 60 minutes and then begin the bowel prep again. If vomiting continues and you cannot take the bowel prep without vomiting, call your health care provider.  General instructions  Ask your health care provider about changing or stopping your regular medicines. This is especially important if you are taking diabetes medicines or blood thinners.  Plan to have someone take you home from the hospital or clinic. What happens during the procedure?  An IV tube may be inserted into one of your veins.  You will be given medicine to help you relax (sedative).  To reduce your risk of infection: ? Your health care team will wash or sanitize their hands. ? Your anal area will be washed with soap.  You will be asked to lie on your side with your knees bent.  Your health care provider will lubricate a long, thin, flexible tube. The tube will have a camera and a light on the end.  The tube will be inserted into your   anus.  The tube will be gently eased through your rectum and colon.  Air will be delivered into your colon to keep it open. You may feel some pressure or cramping.  The camera will be used to take images during the procedure.  A small tissue sample may be removed from your body to be examined under a microscope (biopsy). If any potential problems are found, the tissue will be sent to a lab for testing.  If small polyps are found, your  health care provider may remove them and have them checked for cancer cells.  The tube that was inserted into your anus will be slowly removed. The procedure may vary among health care providers and hospitals. What happens after the procedure?  Your blood pressure, heart rate, breathing rate, and blood oxygen level will be monitored until the medicines you were given have worn off.  Do not drive for 24 hours after the exam.  You may have a small amount of blood in your stool.  You may pass gas and have mild abdominal cramping or bloating due to the air that was used to inflate your colon during the exam.  It is up to you to get the results of your procedure. Ask your health care provider, or the department performing the procedure, when your results will be ready. This information is not intended to replace advice given to you by your health care provider. Make sure you discuss any questions you have with your health care provider. Document Released: 07/23/2000 Document Revised: 05/26/2016 Document Reviewed: 10/07/2015 Elsevier Interactive Patient Education  2018 [employer]. Menopause Menopause is the normal time of life when menstrual periods stop completely. Menopause is complete when you have missed 12 consecutive menstrual periods. It usually occurs between the ages of 71 years and 67 years. Very rarely does a woman develop menopause before the age of 71 years. At menopause, your ovaries stop producing the female hormones estrogen and progesterone. This can cause undesirable symptoms and also affect your health. Sometimes the symptoms may occur 4-5 years before the menopause begins. There is no relationship between menopause and:  Oral contraceptives.  Number of children you had.  Race.  The age your menstrual periods started (menarche).  Heavy smokers and very thin women may develop menopause earlier in life. What are the causes?  The ovaries stop producing the female hormones  estrogen and progesterone. Other causes include:  Surgery to remove both ovaries.  The ovaries stop functioning for no known reason.  Tumors of the pituitary gland in the brain.  Medical disease that affects the ovaries and hormone production.  Radiation treatment to the abdomen or pelvis.  Chemotherapy that affects the ovaries.  What are the signs or symptoms?  Hot flashes.  Night sweats.  Decrease in sex drive.  Vaginal dryness and thinning of the vagina causing painful intercourse.  Dryness of the skin and developing wrinkles.  Headaches.  Tiredness.  Irritability.  Memory problems.  Weight gain.  Bladder infections.  Hair growth of the face and chest.  Infertility. More serious symptoms include:  Loss of bone (osteoporosis) causing breaks (fractures).  Depression.  Hardening and narrowing of the arteries (atherosclerosis) causing heart attacks and strokes.  How is this diagnosed?  When the menstrual periods have stopped for 12 straight months.  Physical exam.  Hormone studies of the blood. How is this treated? There are many treatment choices and nearly as many questions about them. The decisions to treat or not to  treat menopausal changes is an individual choice made with your health care provider. Your health care provider can discuss the treatments with you. Together, you can decide which treatment will work best for you. Your treatment choices may include:  Hormone therapy (estrogen and progesterone).  Non-hormonal medicines.  Treating the individual symptoms with medicine (for example antidepressants for depression).  Herbal medicines that may help specific symptoms.  Counseling by a psychiatrist or psychologist.  Group therapy.  Lifestyle changes including: ? Eating healthy. ? Regular exercise. ? Limiting caffeine and alcohol. ? Stress management and meditation.  No treatment.  Follow these instructions at home:  Take the  medicine your health care provider gives you as directed.  Get plenty of sleep and rest.  Exercise regularly.  Eat a diet that contains calcium (good for the bones) and soy products (acts like estrogen hormone).  Avoid alcoholic beverages.  Do not smoke.  If you have hot flashes, dress in layers.  Take supplements, calcium, and vitamin D to strengthen bones.  You can use over-the-counter lubricants or moisturizers for vaginal dryness.  Group therapy is sometimes very helpful.  Acupuncture may be helpful in some cases. Contact a health care provider if:  You are not sure you are in menopause.  You are having menopausal symptoms and need advice and treatment.  You are still having menstrual periods after age 50 years.  You have pain with intercourse.  Menopause is complete (no menstrual period for 12 months) and you develop vaginal bleeding.  You need a referral to a specialist (gynecologist, psychiatrist, or psychologist) for treatment. Get help right away if:  You have severe depression.  You have excessive vaginal bleeding.  You fell and think you have a broken bone.  You have pain when you urinate.  You develop leg or chest pain.  You have a fast pounding heart beat (palpitations).  You have severe headaches.  You develop vision problems.  You feel a lump in your breast.  You have abdominal pain or severe indigestion. This information is not intended to replace advice given to you by your health care provider. Make sure you discuss any questions you have with your health care provider. Document Released: 10/16/2003 Document Revised: 01/01/2016 Document Reviewed: 02/22/2013 Elsevier Interactive Patient Education  2017 [employer].

## 2018-04-12 NOTE — Progress Notes (Signed)
Patient [facility]   Progress Note: General Provider: Lanae Boast, FNP  SUBJECTIVE:   Catherine [name] is a 57 y.o. female who  has a past medical history of Anxiety, Breast cancer ([place]), Breast cancer of upper-inner quadrant of left female breast ([place]) (06/09/2015), and Depression.. Patient presents today for lump left forearm; change in BM; thinks she might have yeast infection; right lower leg pain (x 3 days when it hurts is at 10 level of pain, currently no pain); and forehead sweating Patient states that she noticed a lump deep in the left forearm x 1 week. Patient denies pain or swelling.  Patient states that she has vaginal itching and would like to be evaluated for yeast infection.  Patient with intermittent sweating that occurs without cause on her forehead. Patient is post  menopausal. Patient with a hx of breast cancer and is concerned that she has cancer in her body due to changes in her bowel movements. Patient states that her fecal matter has become smaller and harder in the past few weeks. She states that she has decreased her fluid intake and fiber. Would like a colonoscopy  Patient with hx of right leg pain that occurs intermittently without cause. Sttes that she noticed the right leg is larger than the left. She is unsure as to how long this has occurred.  Review of Systems  Constitutional: Negative.   HENT: Negative.   Eyes: Negative.   Respiratory: Negative.   Cardiovascular: Negative.   Gastrointestinal: Positive for constipation. Negative for blood in stool.  Genitourinary: Negative.        Vaginal itching   Musculoskeletal: Positive for myalgias.  Skin: Negative.   Neurological: Negative.   Psychiatric/Behavioral: Negative.      OBJECTIVE: BP 136/89 (BP Location: Right Arm, Patient Position: Sitting, Cuff Size: Normal)   Pulse 87   Temp 98.9 F (37.2 C) (Oral)   Ht 5\' 6"  (1.676 m)   Wt 153 lb (69.4 kg)   LMP  06/30/2013   SpO2 100%   BMI 24.69 kg/m   Physical Exam  Constitutional: She is oriented to person, place, and time. She appears well-developed and well-nourished. No distress.  HENT:  Head: Normocephalic and atraumatic.  Eyes: Pupils are equal, round, and reactive to light. Conjunctivae and EOM are normal.  Neck: Normal range of motion.  Cardiovascular: Normal rate, regular rhythm, normal heart sounds and intact distal pulses.  Pulmonary/Chest: Effort normal and breath sounds normal. No respiratory distress.  Abdominal: Soft. Bowel sounds are normal. She exhibits no distension.  Musculoskeletal: Normal range of motion.  Neurological: She is alert and oriented to person, place, and time.  Skin: Skin is warm and dry.  Psychiatric: She has a normal mood and affect. Her behavior is normal. Thought content normal.  Nursing note and vitals reviewed.   ASSESSMENT/PLAN:  1. Essential hypertension The current medical regimen is effective;  continue present plan and medications. - Comprehensive metabolic panel - CBC With Differential - hydrochlorothiazide (HYDRODIURIL) 12.5 MG tablet; Take 1 tablet (12.5 mg total) by mouth daily.  Dispense: 90 tablet; Refill: 2  2. Screen for colon cancer Ordered  - Ambulatory referral to Gastroenterology  3. Change in bowel function Labs and colonoscopy ordered. Instructed patient to increase her fluids and add fiber back into her diet.  - Ambulatory referral to Gastroenterology - Inflammatory Bowel Disease-IBD([facility])  4. Vasomotor flushing Discussed the sweating d/t menopause. Suggested black cohash otc.  - VITAMIN D  25 Hydroxy (Vit-D Deficiency, Fractures) - TSH  5. Vaginal itching Labs pending.  - Vaginitis/Vaginosis, DNA Probe    The patient was given clear instructions to go to ER or return to medical center if symptoms do not improve, worsen or new problems develop. The patient verbalized understanding and agreed with plan of care.    Ms. Doug Sou. Nathaneil Canary, FNP-BC Patient [facility] [address] [place], [place] [address] [phone]     This note has been created with Dragon speech recognition software and smart phrase technology. Any transcriptional errors are unintentional.

## 2018-04-13 ENCOUNTER — Ambulatory Visit (HOSPITAL_COMMUNITY): Payer: No Typology Code available for payment source

## 2018-04-14 LAB — CBC WITH DIFFERENTIAL
Basophils Absolute: 0 10*3/uL (ref 0.0–0.2)
Basos: 0 %
EOS (ABSOLUTE): 0 10*3/uL (ref 0.0–0.4)
Eos: 0 %
Hematocrit: 43.3 % (ref 34.0–46.6)
Hemoglobin: 14.6 g/dL (ref 11.1–15.9)
Immature Grans (Abs): 0 10*3/uL (ref 0.0–0.1)
Immature Granulocytes: 0 %
Lymphocytes Absolute: 3 10*3/uL (ref 0.7–3.1)
Lymphs: 43 %
MCH: 30 pg (ref 26.6–33.0)
MCHC: 33.7 g/dL (ref 31.5–35.7)
MCV: 89 fL (ref 79–97)
Monocytes Absolute: 0.4 10*3/uL (ref 0.1–0.9)
Monocytes: 6 %
Neutrophils Absolute: 3.4 10*3/uL (ref 1.4–7.0)
Neutrophils: 51 %
RBC: 4.87 x10E6/uL (ref 3.77–5.28)
RDW: 15.3 % (ref 12.3–15.4)
WBC: 6.9 10*3/uL (ref 3.4–10.8)

## 2018-04-14 LAB — COMPREHENSIVE METABOLIC PANEL
ALT: 13 IU/L (ref 0–32)
AST: 15 IU/L (ref 0–40)
Albumin/Globulin Ratio: 1.6 (ref 1.2–2.2)
Albumin: 4.8 g/dL (ref 3.5–5.5)
Alkaline Phosphatase: 110 IU/L (ref 39–117)
BUN/Creatinine Ratio: 16 (ref 9–23)
BUN: 14 mg/dL (ref 6–24)
Bilirubin Total: 0.2 mg/dL (ref 0.0–1.2)
CO2: 23 mmol/L (ref 20–29)
Calcium: 10.4 mg/dL — ABNORMAL HIGH (ref 8.7–10.2)
Chloride: 99 mmol/L (ref 96–106)
Creatinine, Ser: 0.89 mg/dL (ref 0.57–1.00)
GFR calc Af Amer: 83 mL/min/{1.73_m2} (ref >59)
GFR calc non Af Amer: 72 mL/min/{1.73_m2} (ref >59)
Globulin, Total: 3 g/dL (ref 1.5–4.5)
Glucose: 96 mg/dL (ref 65–99)
Potassium: 3.7 mmol/L (ref 3.5–5.2)
Sodium: 138 mmol/L (ref 134–144)
Total Protein: 7.8 g/dL (ref 6.0–8.5)

## 2018-04-14 LAB — INFLAMMATORY BOWEL DISEASE-IBD
Atypical pANCA: <1:20 {titer}
Saccharomyces cerevisiae, IgA: <20 Units (ref 0.0–24.9)
Saccharomyces cerevisiae, IgG: <20 Units (ref 0.0–24.9)

## 2018-04-14 LAB — VAGINITIS/VAGINOSIS, DNA PROBE
Candida Species: NEGATIVE
Gardnerella vaginalis: POSITIVE — AB
Trichomonas vaginosis: NEGATIVE

## 2018-04-14 LAB — TSH: TSH: 2.88 u[IU]/mL (ref 0.450–4.500)

## 2018-04-14 LAB — VITAMIN D 25 HYDROXY (VIT D DEFICIENCY, FRACTURES): Vit D, 25-Hydroxy: 13.8 ng/mL — ABNORMAL LOW (ref 30.0–100.0)

## 2018-04-19 ENCOUNTER — Telehealth: Payer: Self-pay

## 2018-04-19 MED ORDER — METRONIDAZOLE 0.75 % VA GEL: 1.0000 | Freq: Two times a day (BID) | VAGINAL | 0 refills | Status: DC

## 2018-04-19 MED ORDER — VITAMIN D (ERGOCALCIFEROL) 1.25 MG (50000 UNIT) PO CAPS: 50000.0000 [IU] | ORAL_CAPSULE | ORAL | 0 refills | Status: AC

## 2018-04-19 MED FILL — metroNIDAZOLE 0.75 % GEL: 0.75 | 5 days supply | Qty: 70 | Fill #0

## 2018-04-19 MED FILL — VIT D2 1.25 MG (50,000 UNIT: 1.25 MG | 56 days supply | Qty: 8 | Fill #0

## 2018-04-19 NOTE — Telephone Encounter (Signed)
Please advise on lab results.

## 2018-04-19 NOTE — Telephone Encounter (Signed)
Sending in metro gel instead of flagyl po.

## 2018-04-19 NOTE — Telephone Encounter (Signed)
-----   Message from Lanae Boast, [place] sent at 04/19/2018  2:41 PM EDT ----- Low Vitamin D. All other labs are stable. I will send Vit D to the pharmacy. After completion of the prescription, patient will need otc vitamin D of 1,000units daily.  No signs of bowel inflammation. Patient is positive for bacterial vaginosis. This is an imbalance of the pH of the vagina. I am sending flagyl 500mg  po BID x 7 days.

## 2018-04-19 NOTE — Telephone Encounter (Signed)
Called and spoke with patient, advised that vitamin D levels were low and to take vitamin D once weekly for 8 weeks then to start otc vitamin D 1000 units once daily thereafter. Advised that vaginal swab was positive for bv and to use vaginal gel twice daily for 7 days. Thanks!

## 2018-06-05 ENCOUNTER — Telehealth: Payer: Self-pay

## 2018-06-05 NOTE — Telephone Encounter (Signed)
Called and spoke with patient. She states she is having urinary retention. I advised that we will need to see her for this, and she was transferred to [facility] to schedule something.

## 2018-06-09 ENCOUNTER — Other Ambulatory Visit (INDEPENDENT_AMBULATORY_CARE_PROVIDER_SITE_OTHER): Payer: Self-pay

## 2018-06-09 VITALS — Ht 66.0 in | Wt 152.0 lb

## 2018-06-09 DIAGNOSIS — R3915 Urgency of urination: Secondary | ICD-10-CM

## 2018-06-09 LAB — POCT URINALYSIS DIPSTICK
Glucose, UA: NEGATIVE
Leukocytes, UA: NEGATIVE
Nitrite, UA: NEGATIVE
Protein, UA: POSITIVE — AB
Spec Grav, UA: 1.025 (ref 1.010–1.025)
Urobilinogen, UA: 1 E.U./dL
pH, UA: 5.5 (ref 5.0–8.0)

## 2018-06-11 LAB — URINE CULTURE

## 2018-06-11 LAB — VAGINITIS/VAGINOSIS, DNA PROBE
Candida Species: NEGATIVE
Gardnerella vaginalis: NEGATIVE
Trichomonas vaginosis: NEGATIVE

## 2018-06-13 ENCOUNTER — Telehealth: Payer: Self-pay

## 2018-06-13 NOTE — Telephone Encounter (Signed)
-----   Message from Azzie Glatter, [place] sent at 06/12/2018 10:10 PM EST ----- Regarding: "Lab Results" Lab results are stable. Swab is negative. Please inform patient. Keep follow up appointment as scheduled.   Thanks.

## 2018-06-13 NOTE — Telephone Encounter (Signed)
Patient notified

## 2018-06-27 ENCOUNTER — Telehealth (HOSPITAL_COMMUNITY): Payer: Self-pay | Admitting: *Deleted

## 2018-06-27 ENCOUNTER — Encounter (HOSPITAL_COMMUNITY): Payer: Self-pay

## 2018-06-27 ENCOUNTER — Ambulatory Visit (HOSPITAL_COMMUNITY)
Admission: RE | Admit: 2018-06-27 | Discharge: 2018-06-27 | Disposition: A | Discharge status: Home / Self Care | Payer: No Typology Code available for payment source | Source: Ambulatory Visit | Attending: Obstetrics and Gynecology | Admitting: Obstetrics and Gynecology

## 2018-06-27 VITALS — BP 114/72 | Wt 153.0 lb

## 2018-06-27 DIAGNOSIS — Z1239 Encounter for other screening for malignant neoplasm of breast: Secondary | ICD-10-CM

## 2018-06-27 DIAGNOSIS — N644 Mastodynia: Secondary | ICD-10-CM

## 2018-06-27 HISTORY — DX: Essential (primary) hypertension: I10

## 2018-06-27 MED FILL — HYDROCHLOROTHIAZIDE 12.5 MG: 12.5 | 90 days supply | Qty: 90 | Fill #0

## 2018-06-27 NOTE — Progress Notes (Signed)
Patient referred to [facility] by [facility] due to recommending a diagnostic mammogram. Last mammogram completed 02/24/2017.  Pap Smear: Pap smear not completed today. Last Pap smear was 06/18/2016 at the [facility] and normal with negative HPV. Per patient has no history of an abnormal Pap smear. Last Pap smear result is in Epic.  Physical exam: Breasts Right breast larger than left breast due to history of left breast mastectomy due to brest cancer in November 2016. No skin abnormalities bilateral breasts. No nipple retraction right breast. No nipple discharge right breast. No lymphadenopathy. No lumps palpated bilateral breasts. Complaint of tenderness at right lower breast and at 3 o'clock on exam. Complaints of diffuse breast tenderness at mastectomy site on exam. Referred patient to [facility] for a diagnostic mammogram per recommendation. Appointment scheduled for Tuesday, June 27, 2018 at 1300.       Pelvic/Bimanual No Pap smear completed today since last Pap smear and HPV typing was 06/18/2016. Pap smear not indicated per BCCCP guidelines.   Smoking History: Patient is a current smoker. Discussed smoking cessation with patient. Referred patient to the [facility] Quitline and gave resources to the free smoking cessation classes at [facility].  Patient Navigation: Patient education provided. Access to services provided for patient through [place] program.   Colorectal Cancer Screening: Per patient has never had a colonoscopy completed. No complaints today. FIT Test given to patient to complete and return to BCCCP.  Breast and Cervical Cancer Risk Assessment: Patient has a personal history of breast cancer in 2016. Patient has no family history of breast cancer, known genetic mutations, or radiation treatment to the chest before age 49. Patient has no history of cervical dysplasia, immunocompromised, or DES exposure in-utero.  Used Spanish interpreter [employer] from  [place]. Risk Assessment    Risk Scores      06/27/2018   Last edited by: Armond Hang, LPN   5-year risk: 1.4 %   Lifetime risk: 7.5 %

## 2018-06-27 NOTE — Telephone Encounter (Signed)
Telephoned patient at home number and advised patient have here scheduled at [facility] Tuesday December 3 9:00. Patient voiced understanding.

## 2018-06-27 NOTE — Patient Instructions (Addendum)
Explained breast self awareness with Arbutus Leas. Patient did not need a Pap smear today due to last Pap smear and HPV typing was 06/18/2016. Let her know BCCCP will cover Pap smears and HPV typing every 5 years unless has a history of abnormal Pap smears. Referred patient to [facility] for a diagnostic mammogram per recommendation. Appointment scheduled for Tuesday, June 27, 2018 at 1300. Patient aware of appointment and will reschedule. Discussed smoking cessation with patient. Referred patient to the [facility] Quitline and gave resources to the free smoking cessation classes at [facility]. Arbutus Leas verbalized understanding.  Elliott Lasecki, [place], RN 12:35 PM

## 2018-07-05 ENCOUNTER — Encounter (HOSPITAL_COMMUNITY): Payer: Self-pay | Admitting: *Deleted

## 2018-07-11 ENCOUNTER — Encounter (HOSPITAL_COMMUNITY): Payer: Self-pay

## 2018-07-12 ENCOUNTER — Ambulatory Visit: Payer: No Typology Code available for payment source | Admitting: Family Medicine

## 2018-07-21 ENCOUNTER — Other Ambulatory Visit: Payer: Self-pay

## 2018-07-23 LAB — SPECIMEN STATUS REPORT

## 2018-07-23 LAB — FECAL OCCULT BLOOD, IMMUNOCHEMICAL: Fecal Occult Bld: NEGATIVE

## 2018-07-24 ENCOUNTER — Telehealth (HOSPITAL_COMMUNITY): Payer: Self-pay | Admitting: *Deleted

## 2018-07-24 ENCOUNTER — Encounter (HOSPITAL_COMMUNITY): Payer: Self-pay | Admitting: *Deleted

## 2018-07-24 NOTE — Telephone Encounter (Signed)
Telephoned patient at home number and advised patient of negative Fit Test. Patient voiced understanding.

## 2018-08-16 ENCOUNTER — Ambulatory Visit: Payer: No Typology Code available for payment source | Admitting: Family Medicine

## 2018-09-06 ENCOUNTER — Encounter (HOSPITAL_COMMUNITY): Payer: Self-pay | Admitting: *Deleted

## 2018-09-12 ENCOUNTER — Other Ambulatory Visit: Payer: No Typology Code available for payment source

## 2018-09-12 ENCOUNTER — Ambulatory Visit: Payer: No Typology Code available for payment source | Admitting: Oncology

## 2018-09-14 ENCOUNTER — Ambulatory Visit: Payer: No Typology Code available for payment source | Admitting: Family Medicine

## 2018-09-29 ENCOUNTER — Inpatient Hospital Stay: Payer: No Typology Code available for payment source

## 2018-09-29 ENCOUNTER — Inpatient Hospital Stay: Payer: No Typology Code available for payment source | Admitting: Oncology

## 2018-10-11 ENCOUNTER — Ambulatory Visit: Payer: No Typology Code available for payment source | Admitting: Family Medicine

## 2018-10-13 ENCOUNTER — Ambulatory Visit: Payer: No Typology Code available for payment source | Admitting: Nurse Practitioner

## 2018-10-19 ENCOUNTER — Encounter: Payer: Self-pay | Admitting: Family Medicine

## 2018-10-19 ENCOUNTER — Other Ambulatory Visit: Payer: Self-pay

## 2018-10-19 ENCOUNTER — Ambulatory Visit (INDEPENDENT_AMBULATORY_CARE_PROVIDER_SITE_OTHER): Payer: Self-pay | Admitting: Family Medicine

## 2018-10-19 VITALS — BP 152/86 | HR 102 | Temp 98.0°F | Ht 66.0 in | Wt 150.8 lb

## 2018-10-19 DIAGNOSIS — N898 Other specified noninflammatory disorders of vagina: Secondary | ICD-10-CM

## 2018-10-19 DIAGNOSIS — L309 Dermatitis, unspecified: Secondary | ICD-10-CM

## 2018-10-19 NOTE — Progress Notes (Signed)
Patient [facility]   Progress Note: General Provider: Lanae Boast, FNP  SUBJECTIVE:   Catherine [name] is a 58 y.o. female who  has a past medical history of  Breast cancer of upper-inner quadrant of left female breast and Hypertension.. Patient presents today for Vaginal Itching and Insect Bite Patient presents with insect bites to the abdomen x 2 days. States that she was sitting down, noticed itching and bumps on her abdomen. Patient reports placing aloe vera gel on the area with relief. Patient reports vaginal itching x 1 week. She denies vaginal discharge at the present time. Has been celibate for 2 years. Denies burning with urination or abdominal pain.  Review of Systems  Constitutional: Negative.   HENT: Negative.   Eyes: Negative.   Respiratory: Negative.   Cardiovascular: Negative.   Gastrointestinal: Negative.   Genitourinary: Negative.        Vaginal irritation  Musculoskeletal: Negative.   Skin: Positive for itching and rash.  Neurological: Negative.   Psychiatric/Behavioral: Negative.      OBJECTIVE: BP (!) 152/86 (BP Location: Right Arm, Patient Position: Sitting, Cuff Size: Large)   Pulse (!) 102   Temp 98 F (36.7 C) (Oral)   Ht 5\' 6"  (1.676 m)   Wt 150 lb 12.8 oz (68.4 kg)   LMP 06/30/2013   SpO2 100%   BMI 24.34 kg/m   Wt Readings from Last 3 Encounters:  10/19/18 150 lb 12.8 oz (68.4 kg)  06/27/18 153 lb (69.4 kg)  06/09/18 152 lb (68.9 kg)     Physical Exam Vitals signs and nursing note reviewed.  Constitutional:      General: She is not in acute distress.    Appearance: She is well-developed.  HENT:     Head: Normocephalic and atraumatic.  Eyes:     Conjunctiva/sclera: Conjunctivae normal.     Pupils: Pupils are equal, round, and reactive to light.  Neck:     Musculoskeletal: Normal range of motion.  Cardiovascular:     Rate and Rhythm: Normal rate and regular rhythm.     Heart sounds: Normal  heart sounds.  Pulmonary:     Effort: Pulmonary effort is normal. No respiratory distress.     Breath sounds: Normal breath sounds.  Abdominal:     General: Bowel sounds are normal. There is no distension.     Palpations: Abdomen is soft.  Musculoskeletal: Normal range of motion.  Skin:    General: Skin is warm and dry.     Findings: Rash present. Rash is urticarial (noted to the left side of the abdomen. 3 wheals noted. Mild erythema and excoriation. ).  Neurological:     Mental Status: She is alert and oriented to person, place, and time.  Psychiatric:        Behavior: Behavior normal.        Thought Content: Thought content normal.     ASSESSMENT/PLAN:  1. Vaginal irritation Patient would like to wait for results before receiving treatment.  - NuSwab Vaginitis (VG)  2. Dermatitis Secondary to possible insect bite. Instructed patient to continue with aloe vera if that is helping. Gave her triple antibiotic ointment packets to place on BID.     Return in about 3 months (around 01/19/2019).    The patient was given clear instructions to go to ER or return to medical center if symptoms do not improve, worsen or new problems develop. The patient verbalized understanding and agreed with plan  of care.   Ms. Doug Sou. Nathaneil Canary, FNP-BC Patient [facility] [address] [place], [place] [address] [phone]

## 2018-10-22 LAB — NUSWAB VAGINITIS (VG)
Candida albicans, NAA: NEGATIVE
Candida glabrata, NAA: NEGATIVE
Trich vag by NAA: NEGATIVE

## 2018-11-01 ENCOUNTER — Telehealth: Payer: Self-pay | Admitting: Oncology

## 2018-11-01 NOTE — Telephone Encounter (Signed)
R/s appt per 3/24 sch message - pt aware of apt date and time - sent reminder letter in the mail with appt date and time

## 2018-11-07 ENCOUNTER — Ambulatory Visit: Payer: No Typology Code available for payment source | Admitting: Oncology

## 2018-11-07 ENCOUNTER — Other Ambulatory Visit: Payer: No Typology Code available for payment source

## 2018-11-13 MED FILL — HYDROCHLOROTHIAZIDE 12.5 MG: 12.5 | 90 days supply | Qty: 90 | Fill #1

## 2018-12-14 NOTE — Telephone Encounter (Signed)
Message sent to provider 

## 2019-01-22 ENCOUNTER — Telehealth: Payer: Self-pay

## 2019-01-22 NOTE — Telephone Encounter (Signed)
Called and spoke with patient. She states she has been having head pain and ear pain that has come and gone away. She also states she coughed and when phlem came up she had two tiny specks of blood in phlem.  She denies any sob/blured vision/severe headache. I advised her if the symptoms return and do not go away or if she has vision changes or severe pain that doesn't go away to go to ER. Patient verbalized understanding.

## 2019-03-26 ENCOUNTER — Telehealth: Payer: Self-pay

## 2019-03-26 ENCOUNTER — Other Ambulatory Visit: Payer: Self-pay

## 2019-03-26 ENCOUNTER — Ambulatory Visit (INDEPENDENT_AMBULATORY_CARE_PROVIDER_SITE_OTHER): Payer: Self-pay | Admitting: Family Medicine

## 2019-03-26 VITALS — BP 153/89 | HR 88 | Temp 98.1°F | Resp 20 | Wt 152.8 lb

## 2019-03-26 DIAGNOSIS — F17200 Nicotine dependence, unspecified, uncomplicated: Secondary | ICD-10-CM

## 2019-03-26 DIAGNOSIS — M545 Low back pain, unspecified: Secondary | ICD-10-CM

## 2019-03-26 DIAGNOSIS — G8929 Other chronic pain: Secondary | ICD-10-CM

## 2019-03-26 DIAGNOSIS — E785 Hyperlipidemia, unspecified: Secondary | ICD-10-CM

## 2019-03-26 DIAGNOSIS — Z0000 Encounter for general adult medical examination without abnormal findings: Secondary | ICD-10-CM

## 2019-03-26 MED ORDER — NAPROXEN 500 MG PO TABS: 500.0000 mg | ORAL_TABLET | Freq: Two times a day (BID) | ORAL | 0 refills | Status: DC

## 2019-03-26 MED ORDER — KETOROLAC TROMETHAMINE 60 MG/2ML IM SOLN
60.0000 mg | Freq: Once | INTRAMUSCULAR | Status: AC
  Administered 2019-03-26: 09:00:00 60 mg via INTRAMUSCULAR

## 2019-03-26 MED FILL — NAPROXEN 500 MG TABLET: 500 | 15 days supply | Qty: 30 | Fill #0

## 2019-03-26 NOTE — Patient Instructions (Signed)
I am sending you for imaging. You can go to [facility] radiology department for this any time. .   Chronic Back Pain When back pain lasts longer than 3 months, it is called chronic back pain. Pain may get worse at certain times (flare-ups). There are things you can do at home to manage your pain. Follow these instructions at home: Activity      Avoid bending and other activities that make pain worse.  When standing: ? Keep your upper back and neck straight. ? Keep your shoulders pulled back. ? Avoid slouching.  When sitting: ? Keep your back straight. ? Relax your shoulders. Do not round your shoulders or pull them backward.  Do not sit or stand in one place for long periods of time.  Take short rest breaks during the day. Lying down or standing is usually better than sitting. Resting can help relieve pain.  When sitting or lying down for a long time, do some mild activity or stretching. This will help to prevent stiffness and pain.  Get regular exercise. Ask your doctor what activities are safe for you.  Do not lift anything that is heavier than 10 lb (4.5 kg). To prevent injury when you lift things: ? Bend your knees. ? Keep the weight close to your body. ? Avoid twisting. Managing pain  If told, put ice on the painful area. Your doctor may tell you to use ice for 24-48 hours after a flare-up starts. ? Put ice in a plastic bag. ? Place a towel between your skin and the bag. ? Leave the ice on for 20 minutes, 2-3 times a day.  If told, put heat on the painful area as often as told by your doctor. Use the heat source that your doctor recommends, such as a moist heat pack or a heating pad. ? Place a towel between your skin and the heat source. ? Leave the heat on for 20-30 minutes. ? Remove the heat if your skin turns bright red. This is especially important if you are unable to feel pain, heat, or cold. You may have a greater risk of getting burned.  Soak in a warm bath.  This can help relieve pain.  Take over-the-counter and prescription medicines only as told by your doctor. General instructions  Sleep on a firm mattress. Try lying on your side with your knees slightly bent. If you lie on your back, put a pillow under your knees.  Keep all follow-up visits as told by your doctor. This is important. Contact a doctor if:  You have pain that does not get better with rest or medicine. Get help right away if:  One or both of your arms or legs feel weak.  One or both of your arms or legs lose feeling (numbness).  You have trouble controlling when you poop (bowel movement) or pee (urinate).  You feel sick to your stomach (nauseous).  You throw up (vomit).  You have belly (abdominal) pain.  You have shortness of breath.  You pass out (faint). Summary  When back pain lasts longer than 3 months, it is called chronic back pain.  Pain may get worse at certain times (flare-ups).  Use ice and heat as told by your doctor. Your doctor may tell you to use ice after flare-ups. This information is not intended to replace advice given to you by your health care provider. Make sure you discuss any questions you have with your health care provider. Document Released: 01/12/2008  Document Revised: 11/16/2018 Document Reviewed: 03/10/2017 Elsevier Patient Education  2020 [employer].

## 2019-03-26 NOTE — Progress Notes (Signed)
[name]   Progress Note: General Provider: Lanae Boast, FNP  SUBJECTIVE:   Catherine [name] is a 58 y.o. female who  has a past medical history of Anxiety, Breast cancer ([place]), Breast cancer of upper-inner quadrant of left female breast ([place]) (06/09/2015), Depression, and Hypertension.. Patient presents today for Annual Exam and Back Pain Patient states that she has chronic lower back pain. She reports mild relief with taking otc advil. She reports pain is sharp and stabbing. She states that sometimes she has mild numbness and tingling that extends to the bilateral knees. She states that she fell 3 weeks ago and is not sure as the cause of the fall. She states that this has been going on for several years and she would like to have further evaluation. She does not currently have health insurance.    Review of Systems  Constitutional: Negative.   HENT: Negative.   Eyes: Negative.   Respiratory: Negative.   Cardiovascular: Negative.   Gastrointestinal: Negative.   Genitourinary: Negative.   Musculoskeletal: Positive for back pain and falls (reports 1 fall in the last 3 weeks).  Skin: Negative.   Neurological: Negative.   Psychiatric/Behavioral: Negative.      OBJECTIVE: BP (!) 153/89 (BP Location: Left Arm, Patient Position: Sitting)   Pulse 88   Temp 98.1 F (36.7 C) (Oral)   Resp 20   Wt 152 lb 12.8 oz (69.3 kg)   LMP 06/30/2013   SpO2 99%   BMI 24.66 kg/m   Wt Readings from Last 3 Encounters:  03/26/19 152 lb 12.8 oz (69.3 kg)  10/19/18 150 lb 12.8 oz (68.4 kg)  06/27/18 153 lb (69.4 kg)     Physical Exam Vitals signs and nursing note reviewed.  Constitutional:      General: She is not in acute distress.    Appearance: Normal appearance.  HENT:     Head: Normocephalic and atraumatic.  Eyes:     Extraocular Movements: Extraocular movements intact.     Conjunctiva/sclera: Conjunctivae normal.     Pupils:  Pupils are equal, round, and reactive to light.  Cardiovascular:     Rate and Rhythm: Normal rate and regular rhythm.     Heart sounds: No murmur.  Pulmonary:     Effort: Pulmonary effort is normal.     Breath sounds: Normal breath sounds.  Musculoskeletal: Normal range of motion.        General: Swelling (lumbar spine) and tenderness (lumbar spine) present.  Skin:    General: Skin is warm and dry.  Neurological:     Mental Status: She is alert and oriented to person, place, and time.  Psychiatric:        Mood and Affect: Mood normal.        Behavior: Behavior normal.        Thought Content: Thought content normal.        Judgment: Judgment normal.     ASSESSMENT/PLAN:   1. Encounter for wellness examination in adult Patient states that she is fasting for labs today.  - Comprehensive metabolic panel - CBC With Differential - Lipid Panel - TSH - Vitamin D, 25-hydroxy  2. Tobacco dependence Smoking cessation instruction/counseling given:  counseled patient on the dangers of tobacco use, advised patient to stop smoking, and reviewed strategies to maximize success   3. Hyperlipidemia LDL goal <100 Labs pending.   4. Chronic bilateral low back pain without sciatica Patient offered imaging. She does  not have time to do so today. Orders placed and pending.  - ketorolac (TORADOL) injection 60 mg - DG Lumbar Spine Complete; Future     Return in about 6 months (around 09/26/2019), or if symptoms worsen or fail to improve, for HTN.    The patient was given clear instructions to go to ER or return to medical center if symptoms do not improve, worsen or new problems develop. The patient verbalized understanding and agreed with plan of care.   Ms. Doug Sou. Nathaneil Canary, FNP-BC [facility] [address] [place], [place] [address] [phone]

## 2019-03-27 LAB — CBC WITH DIFFERENTIAL
Basophils Absolute: 0 10*3/uL (ref 0.0–0.2)
Basos: 0 %
EOS (ABSOLUTE): 0 10*3/uL (ref 0.0–0.4)
Eos: 1 %
Hematocrit: 43.7 % (ref 34.0–46.6)
Hemoglobin: 14.5 g/dL (ref 11.1–15.9)
Immature Grans (Abs): 0 10*3/uL (ref 0.0–0.1)
Immature Granulocytes: 0 %
Lymphocytes Absolute: 2.9 10*3/uL (ref 0.7–3.1)
Lymphs: 45 %
MCH: 29.4 pg (ref 26.6–33.0)
MCHC: 33.2 g/dL (ref 31.5–35.7)
MCV: 89 fL (ref 79–97)
Monocytes Absolute: 0.5 10*3/uL (ref 0.1–0.9)
Monocytes: 8 %
Neutrophils Absolute: 2.9 10*3/uL (ref 1.4–7.0)
Neutrophils: 46 %
RBC: 4.93 x10E6/uL (ref 3.77–5.28)
RDW: 14.6 % (ref 11.7–15.4)
WBC: 6.4 10*3/uL (ref 3.4–10.8)

## 2019-03-27 LAB — COMPREHENSIVE METABOLIC PANEL
ALT: 15 IU/L (ref 0–32)
AST: 17 IU/L (ref 0–40)
Albumin/Globulin Ratio: 2 (ref 1.2–2.2)
Albumin: 4.9 g/dL (ref 3.8–4.9)
Alkaline Phosphatase: 106 IU/L (ref 39–117)
BUN/Creatinine Ratio: 13 (ref 9–23)
BUN: 12 mg/dL (ref 6–24)
Bilirubin Total: 0.2 mg/dL (ref 0.0–1.2)
CO2: 21 mmol/L (ref 20–29)
Calcium: 10.6 mg/dL — ABNORMAL HIGH (ref 8.7–10.2)
Chloride: 102 mmol/L (ref 96–106)
Creatinine, Ser: 0.91 mg/dL (ref 0.57–1.00)
GFR calc Af Amer: 80 mL/min/{1.73_m2} (ref >59)
GFR calc non Af Amer: 70 mL/min/{1.73_m2} (ref >59)
Globulin, Total: 2.4 g/dL (ref 1.5–4.5)
Glucose: 103 mg/dL — ABNORMAL HIGH (ref 65–99)
Potassium: 4.2 mmol/L (ref 3.5–5.2)
Sodium: 139 mmol/L (ref 134–144)
Total Protein: 7.3 g/dL (ref 6.0–8.5)

## 2019-03-27 LAB — LIPID PANEL
Chol/HDL Ratio: 6.2 ratio — ABNORMAL HIGH (ref 0.0–4.4)
Cholesterol, Total: 271 mg/dL — ABNORMAL HIGH (ref 100–199)
HDL: 44 mg/dL (ref >39)
LDL Calculated: 186 mg/dL — ABNORMAL HIGH (ref 0–99)
Triglycerides: 204 mg/dL — ABNORMAL HIGH (ref 0–149)
VLDL Cholesterol Cal: 41 mg/dL — ABNORMAL HIGH (ref 5–40)

## 2019-03-27 LAB — VITAMIN D 25 HYDROXY (VIT D DEFICIENCY, FRACTURES): Vit D, 25-Hydroxy: 17.1 ng/mL — ABNORMAL LOW (ref 30.0–100.0)

## 2019-03-27 LAB — TSH: TSH: 3.85 u[IU]/mL (ref 0.450–4.500)

## 2019-03-27 NOTE — Telephone Encounter (Signed)
Catherine [name], Patient called today 03/27/2019. She is advising that note form appointment for physical preformed yesterday be as detailed as possible. She says she needs this for her disability and if details are not in note then this would have been a wasted visit. Please include how her movement is. Thanks!

## 2019-03-28 NOTE — Telephone Encounter (Signed)
Ok. I was unaware of any specific physical that she was requiring. I did not give her anything pertaining to a physical. She may need to talk to Manuela Schwartz about the steps for disability.

## 2019-03-30 ENCOUNTER — Other Ambulatory Visit: Payer: Self-pay | Admitting: Family Medicine

## 2019-03-30 ENCOUNTER — Telehealth: Payer: Self-pay

## 2019-03-30 DIAGNOSIS — E785 Hyperlipidemia, unspecified: Secondary | ICD-10-CM

## 2019-03-30 MED ORDER — ATORVASTATIN CALCIUM 20 MG PO TABS: 20.0000 mg | ORAL_TABLET | Freq: Every day | ORAL | 3 refills | Status: DC

## 2019-03-30 MED FILL — ATORVASTATIN 20 MG TABLET: 20 | 30 days supply | Qty: 30 | Fill #0

## 2019-03-30 NOTE — Telephone Encounter (Signed)
-----   Message from Lanae Boast, [place] sent at 03/30/2019  1:20 PM EDT ----- Please let patient know that her cholesterol is elevated. I am starting her on a medication called lipitor (atorvastatin) 20 mg every day. This medication, along with diet and exercise, will help to control your cholesterol.

## 2019-03-30 NOTE — Progress Notes (Signed)
Please let patient know that her cholesterol is elevated. I am starting her on a medication called lipitor (atorvastatin) 20 mg every day. This medication, along with diet and exercise, will help to control your cholesterol.

## 2019-03-30 NOTE — Telephone Encounter (Signed)
Called and spoke with patient, advised that cholesterol is elevated and that she is to start lipitor 20mg  once daily. Advised to exercise and eat low fat/low cholesterol diet. Thanks!

## 2019-04-02 ENCOUNTER — Ambulatory Visit: Payer: No Typology Code available for payment source | Admitting: Family Medicine

## 2019-04-02 ENCOUNTER — Encounter: Payer: Self-pay | Admitting: Family Medicine

## 2019-04-18 ENCOUNTER — Encounter (HOSPITAL_COMMUNITY): Payer: Self-pay | Admitting: *Deleted

## 2019-04-18 ENCOUNTER — Encounter (HOSPITAL_COMMUNITY): Payer: Self-pay

## 2019-05-08 ENCOUNTER — Telehealth: Payer: Self-pay | Admitting: Oncology

## 2019-05-08 ENCOUNTER — Other Ambulatory Visit: Payer: Self-pay | Admitting: *Deleted

## 2019-05-08 DIAGNOSIS — C50212 Malignant neoplasm of upper-inner quadrant of left female breast: Secondary | ICD-10-CM

## 2019-05-08 DIAGNOSIS — Z170 Estrogen receptor positive status [ER+]: Secondary | ICD-10-CM

## 2019-05-08 NOTE — Telephone Encounter (Signed)
Returned patient's phone call regarding rescheduling an appointment, per patient's request 09/30 appointment has been moved to 11/30.

## 2019-05-09 ENCOUNTER — Inpatient Hospital Stay: Payer: Medicaid Other

## 2019-05-09 ENCOUNTER — Inpatient Hospital Stay: Payer: Medicaid Other | Admitting: Oncology

## 2019-06-05 ENCOUNTER — Other Ambulatory Visit: Payer: Self-pay

## 2019-06-05 DIAGNOSIS — I10 Essential (primary) hypertension: Secondary | ICD-10-CM

## 2019-06-05 MED ORDER — HYDROCHLOROTHIAZIDE 12.5 MG PO TABS: 12.5000 mg | ORAL_TABLET | Freq: Every day | ORAL | 2 refills | Status: DC

## 2019-06-05 MED FILL — HYDROCHLOROTHIAZIDE 12.5 MG: 12.5 | 90 days supply | Qty: 90 | Fill #0

## 2019-06-05 MED FILL — ATORVASTATIN CALCIUM 20 MG: 20 | 30 days supply | Qty: 30 | Fill #0

## 2019-07-03 MED FILL — ATORVASTATIN CALCIUM 20 MG: 20 | 90 days supply | Qty: 90 | Fill #1

## 2019-07-04 ENCOUNTER — Telehealth: Payer: Self-pay | Admitting: Oncology

## 2019-07-04 NOTE — Telephone Encounter (Signed)
Returned patient's phone call regarding rescheduling 11/30 appointment, per patient's request appointment has moved t 01/05.

## 2019-07-09 ENCOUNTER — Ambulatory Visit: Payer: No Typology Code available for payment source | Admitting: Oncology

## 2019-07-09 ENCOUNTER — Other Ambulatory Visit: Payer: No Typology Code available for payment source

## 2019-08-13 NOTE — Progress Notes (Signed)
[facility]  [phone] [phone]     ID: Catherine [name] DOB: [birth year]  MR#: [id number]  QMG#:[id number]  Patient Care Team: Lanae Boast, [place] as PCP - General (Family Medicine) [name], MD as Consulting Physician (General Surgery) Zarin Hagmann, Virgie Dad, MD as Consulting Physician (Oncology) Arloa Koh, MD (Inactive) as Consulting Physician (Radiation Oncology) Mauro Kaufmann, RN as Registered Nurse Rockwell Germany, RN as Registered Nurse OTHER MD:   CHIEF COMPLAINT: Locally advanced breast cancer (s/p left mastectomy)  CURRENT TREATMENT: Observation.   INTERVAL HISTORY: Catherine [name] returns today for follow-up of her estrogen receptor positive breast cancer.  She was last seen here 08/10/2017.  She has not been on estrogens (see details under assessment below).  Since her last visit, she underwentright diagnostic mammography with [facility] at [facility] on 07/11/2018 showing: breast density category C; no evidence of malignancy.  She is aware that she is a little bit late on mammography   REVIEW OF SYSTEMS: Catherine [name] tells me she is having no unusual headaches visual changes nausea vomiting cough phlegm production pleurisy shortness of breath or change in bowel or bladder habits.  She is not exercising regularly.  She still has soreness and sensitivity in the mastectomy area, which comes and goes, but this has not changed in many years.  A detailed review of systems is otherwise stable   BREAST CANCER HISTORY: From the original intake note:  Catherine [name] tells me she has always had many breast cysts, and that the 1 in the left breast that turned out to be cancer didn't seem any different from any of the other ones. She did notice some skin dimpling so after a few months she brought it to the attention of Dr. Criss Rosales and on 05/28/2015 she underwent bilateral diagnostic mammography with tomosynthesis and left breast ultrasonography at [facility]. The most  recent mammogram prior to this was 2009. The breast density was category C. In the left breast upper inner quadrant there was a 4 cm irregular mass associated with architectural distortion and nipple retraction. By ultrasound this measured 4 cm, and had micro-lobulated margins. There was a lymph node in the left axilla with a focus of cortical thickening.  On 06/05/2015 the patient underwent biopsy of the breast mass in question. This showed (SAA [id number]) and invasive ductal carcinoma, grade 2 or 3, estrogen receptor 95% positive, progesterone receptor 40% positive, both with strong staining intensity, with an MIB-1 of 20%, and HER-2 equivocal for amplification, the signals ratio being 1.43 but the number per cell 4.38. Additional studies are pending to clarify the HER-2 status.  Biopsy of the abnormal lymph node in the left axilla was also scheduled for 06/05/2015 but the patient refused  The patient's subsequent history is as detailed below    PAST MEDICAL HISTORY: Past Medical History:  Diagnosis Date  . Anxiety    pt very anxious on phone  . Breast cancer ([place])   . Breast cancer of upper-inner quadrant of left female breast ([place]) 06/09/2015  . Depression   . Hypertension     PAST SURGICAL HISTORY: Past Surgical History:  Procedure Laterality Date  . BREAST SURGERY    . EVACUATION BREAST HEMATOMA Left 07/02/2015   Procedure: EVACUATION HEMATOMA LEFT Mastectomy Site;  Surgeon: Judeth Horn, MD;  Location: [facility];  Service: General;  Laterality: Left;  Marland Kitchen MASTECTOMY W/ SENTINEL NODE BIOPSY Left 07/01/2015   Procedure: LEFT MASTECTOMY WITH SENTINEL LYMPH NODE BIOPSY;  Surgeon: [name], MD;  Location:  [facility];  Service: General;  Laterality: Left;  . PORT-A-CATH REMOVAL Right 09/04/2015   Procedure: REMOVAL PORT-A-CATH;  Surgeon: [name], MD;  Location: [facility];  Service: General;  Laterality: Right;  . PORTACATH PLACEMENT N/A 07/01/2015   Procedure: INSERTION  PORT-A-CATH;  Surgeon: [name], MD;  Location: [facility];  Service: General;  Laterality: N/A;    FAMILY HISTORY Family History  Problem Relation Age of Onset  . Hypertension Mother   . Hypertension Father   . Hypertension Sister   . Hypertension Brother    The patient's parents are in their mid to late 35s. [name] had 4 brothers, 2 sisters. There is a history of prostate and brain cancer on the maternal side but no history of ovarian or breast cancer   GYNECOLOGIC HISTORY:  Patient's last menstrual period was 06/30/2013. Menarche age 59, first live birth age 59. The patient is GX P2. She stopped having periods at age 72. She did not take hormone replacement. She never used oral contraceptives.   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  She is on disability because of her bipolar disorder.  At home is just she and her "baby" Catherine [name], 78.  He is autistic she tells me.  He is going to school. Son Catherine [name], 70, works for the [employer] and lives in [place]. The patient has 2 grandchildren. 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    ADVANCED DIRECTIVES: Not in place. At the initial clinic visit the patient was given the appropriate forms to complete and notarize at her discretion.   HEALTH MAINTENANCE: Social History   Tobacco Use  . Smoking status: Current Every Day Smoker    Packs/day: 1.00    Years: 30.00    Pack years: 30.00    Types: Cigarettes  . Smokeless tobacco: Never Used  Substance Use Topics  . Alcohol use: No    Alcohol/week: 1.0 standard drinks    Types: 1 Glasses of wine per week    Comment: daily  . Drug use: No     Colonoscopy: Never  PAP: 06/18/2016: Negative for intraepithelial lesion or malignancy, no HPV detected  Bone density: Never  Lipid panel:  Allergies  Allergen Reactions  . Codeine Nausea And Vomiting  . Metronidazole Nausea  And Vomiting    Current Outpatient Medications  Medication Sig Dispense Refill  . atorvastatin (LIPITOR) 20 MG tablet Take 1 tablet (20 mg total) by mouth daily. 90 tablet 3  . hydrochlorothiazide (HYDRODIURIL) 12.5 MG tablet Take 1 tablet (12.5 mg total) by mouth daily. 90 tablet 2  . naproxen (NAPROSYN) 500 MG tablet Take 1 tablet (500 mg total) by mouth 2 (two) times daily with a meal. 30 tablet 0   No current facility-administered medications for this visit.    OBJECTIVE: Middle-aged African-American woman who appears stated age  26:   08/14/19 0909  BP: (!) 156/90  Pulse: 79  Resp: 18  Temp: 97.8 F (36.6 C)  SpO2: 100%     Body mass index is 25.23 kg/m.    ECOG FS:0 - Asymptomatic  Sclerae unicteric, EOMs intact Wearing a mask No cervical or supraclavicular adenopathy Lungs no rales or rhonchi Heart regular rate and rhythm Abd soft, nontender, positive bowel sounds MSK no focal spinal tenderness, no upper extremity lymphedema Neuro: nonfocal, well oriented, appropriate affect Breasts: The right breast is unremarkable.  The left  breast is status post mastectomy.  There is no evidence of chest wall recurrence.  Both axillae are benign.   LAB RESULTS:  CMP     Component Value Date/Time   NA 139 03/26/2019 0849   NA 142 08/10/2017 0846   K 4.2 03/26/2019 0849   K 4.3 08/10/2017 0846   CL 102 03/26/2019 0849   CO2 21 03/26/2019 0849   CO2 27 08/10/2017 0846   GLUCOSE 103 (H) 03/26/2019 0849   GLUCOSE 88 08/10/2017 0846   BUN 12 03/26/2019 0849   BUN 12.0 08/10/2017 0846   CREATININE 0.91 03/26/2019 0849   CREATININE 0.9 08/10/2017 0846   CALCIUM 10.6 (H) 03/26/2019 0849   CALCIUM 10.2 08/10/2017 0846   PROT 7.3 03/26/2019 0849   PROT 7.5 08/10/2017 0846   ALBUMIN 4.9 03/26/2019 0849   ALBUMIN 4.3 08/10/2017 0846   AST 17 03/26/2019 0849   AST 15 08/10/2017 0846   ALT 15 03/26/2019 0849   ALT 19 08/10/2017 0846   ALKPHOS 106 03/26/2019 0849    ALKPHOS 122 08/10/2017 0846   BILITOT 0.2 03/26/2019 0849   BILITOT 0.22 08/10/2017 0846   GFRNONAA 70 03/26/2019 0849   GFRNONAA 70 03/25/2017 1015   GFRAA 80 03/26/2019 0849   GFRAA 80 03/25/2017 1015    INo results found for: SPEP, UPEP  Lab Results  Component Value Date   WBC 7.8 08/14/2019   NEUTROABS 4.1 08/14/2019   HGB 13.2 08/14/2019   HCT 39.0 08/14/2019   MCV 87.6 08/14/2019   PLT 267 08/14/2019      Chemistry      Component Value Date/Time   NA 139 03/26/2019 0849   NA 142 08/10/2017 0846   K 4.2 03/26/2019 0849   K 4.3 08/10/2017 0846   CL 102 03/26/2019 0849   CO2 21 03/26/2019 0849   CO2 27 08/10/2017 0846   BUN 12 03/26/2019 0849   BUN 12.0 08/10/2017 0846   CREATININE 0.91 03/26/2019 0849   CREATININE 0.9 08/10/2017 0846      Component Value Date/Time   CALCIUM 10.6 (H) 03/26/2019 0849   CALCIUM 10.2 08/10/2017 0846   ALKPHOS 106 03/26/2019 0849   ALKPHOS 122 08/10/2017 0846   AST 17 03/26/2019 0849   AST 15 08/10/2017 0846   ALT 15 03/26/2019 0849   ALT 19 08/10/2017 0846   BILITOT 0.2 03/26/2019 0849   BILITOT 0.22 08/10/2017 0846       No results found for: LABCA2  No components found for: QIWLN989  No results for input(s): INR in the last 168 hours.  Urinalysis    Component Value Date/Time   LABSPEC 1.020 09/30/2017 1125   PHURINE 5.5 09/30/2017 1125   GLUCOSEU NEGATIVE 09/30/2017 1125   HGBUR TRACE (A) 09/30/2017 1125   BILIRUBINUR small 06/09/2018 0918   KETONESUR NEGATIVE 09/30/2017 1125   PROTEINUR Positive (A) 06/09/2018 0918   PROTEINUR NEGATIVE 09/30/2017 1125   UROBILINOGEN 1.0 06/09/2018 0918   UROBILINOGEN 0.2 09/30/2017 1125   NITRITE neg 06/09/2018 0918   NITRITE NEGATIVE 09/30/2017 1125   LEUKOCYTESUR Negative 06/09/2018 0918    STUDIES: No results found.   ASSESSMENT: 59 y.o.  [place] woman status post left breast upper inner quadrant biopsy 06/05/2015 for a clinical T2 NX, stage II or III invasive  ductal carcinoma, estrogen and progesterone receptor positive, with an MIB-1 of 20%, and equivocal HER-2 amplification  (a) the patient refused biopsy of a suspicious left axillary lymph node  (b) bone scan 02/11/2016  showed no evidence of bony metastases.  (1) status post left mastectomy and sentinel lymph node sampling 07/01/2015 for a pT3 pN0, stage IIB invasive ductal carcinoma, grade 2, repeat estrogen and progesterone receptor positive, HER-2 not amplified by FISH, with an MIB-1 of 30%.  (a) a total of 4 left sentinel lymph nodes removed  (2) Oncotype DX score of 16 predicts a risk of recurrence outside the breast within 10 years of 10% if the patient's only systemic therapy is tamoxifen for 5 years. It also predicts no significant benefit from chemotherapy.  (3) adjuvant radiation 10/06/15-11/14/15 Site/dose:    Left chest wall / 50.4 Gray @ 1.8 Gray per fraction x 28 fractions  (4) tamoxifen taken from 11/07/15 to 12/17/15. Discontinued because of vaginal irritation and itchiness.  (5) anastrozole prescribed 01/07/16, never started by patient   (5) bipolar disorder: The patient refuses medication at this time  (6) tobacco abuse disorder: Discussed in detail   PLAN: Jaiyanna is now a little over 4 years out from definitive surgery for her breast cancer with no evidence of disease recurrence.  This is very favorable.  She is behind on mammography and we are putting her in for mammogram at this month and also early January 2022.  I told her she could "graduate" when she sees me in 1 year but she tells me she actually wants to continue to come here on a yearly basis and I will be glad to operationalize that for her.  She tells me that she needs to go to a certain place so she can get her Depakote.  She says she has not been on Depakote for many years.  She tells me sometimes she does get into crying and depression and she thought she was doing a little bit better on Depakote but on the  other hand she is very anxious because she is now on other medications and she does not know how the Depakote will work on that.  I suggested she give it a try.  Otherwise the plan is for mammography this month, a year from now, and then a visit following that.  She knows to call for any other issue that may develop before the next visit here.  I spent 30 minutes total time in this encounter with Gurney Maxin, Virgie Dad, MD  08/14/19 9:10 AM Medical Oncology and Hematology [facility] [place], [place] [address] Tel. [phone]    Fax. [phone]   I, Wilburn [name], am acting as scribe for Dr. Virgie Dad. Sarit Sparano.  I, Lurline Del MD, have reviewed the above documentation for accuracy and completeness, and I agree with the above.

## 2019-08-14 ENCOUNTER — Encounter: Payer: Self-pay | Admitting: *Deleted

## 2019-08-14 ENCOUNTER — Inpatient Hospital Stay: Payer: Medicaid Other | Attending: Oncology

## 2019-08-14 ENCOUNTER — Inpatient Hospital Stay (HOSPITAL_BASED_OUTPATIENT_CLINIC_OR_DEPARTMENT_OTHER): Payer: Medicaid Other | Admitting: Oncology

## 2019-08-14 ENCOUNTER — Other Ambulatory Visit: Payer: Self-pay

## 2019-08-14 VITALS — BP 156/90 | HR 79 | Temp 97.8°F | Resp 18 | Ht 66.0 in | Wt 156.3 lb

## 2019-08-14 DIAGNOSIS — Z853 Personal history of malignant neoplasm of breast: Secondary | ICD-10-CM | POA: Insufficient documentation

## 2019-08-14 DIAGNOSIS — F3160 Bipolar disorder, current episode mixed, unspecified: Secondary | ICD-10-CM

## 2019-08-14 DIAGNOSIS — F17210 Nicotine dependence, cigarettes, uncomplicated: Secondary | ICD-10-CM | POA: Diagnosis not present

## 2019-08-14 DIAGNOSIS — Z923 Personal history of irradiation: Secondary | ICD-10-CM | POA: Insufficient documentation

## 2019-08-14 DIAGNOSIS — Z170 Estrogen receptor positive status [ER+]: Secondary | ICD-10-CM | POA: Insufficient documentation

## 2019-08-14 DIAGNOSIS — F319 Bipolar disorder, unspecified: Secondary | ICD-10-CM | POA: Diagnosis not present

## 2019-08-14 DIAGNOSIS — C50212 Malignant neoplasm of upper-inner quadrant of left female breast: Secondary | ICD-10-CM

## 2019-08-14 DIAGNOSIS — Z9012 Acquired absence of left breast and nipple: Secondary | ICD-10-CM | POA: Diagnosis not present

## 2019-08-14 LAB — CBC WITH DIFFERENTIAL (CANCER CENTER ONLY)
Abs Immature Granulocytes: 0.01 10*3/uL (ref 0.00–0.07)
Basophils Absolute: 0 10*3/uL (ref 0.0–0.1)
Basophils Relative: 0 %
Eosinophils Absolute: 0.1 10*3/uL (ref 0.0–0.5)
Eosinophils Relative: 1 %
HCT: 39 % (ref 36.0–46.0)
Hemoglobin: 13.2 g/dL (ref 12.0–15.0)
Immature Granulocytes: 0 %
Lymphocytes Relative: 40 %
Lymphs Abs: 3.1 10*3/uL (ref 0.7–4.0)
MCH: 29.7 pg (ref 26.0–34.0)
MCHC: 33.8 g/dL (ref 30.0–36.0)
MCV: 87.6 fL (ref 80.0–100.0)
Monocytes Absolute: 0.6 10*3/uL (ref 0.1–1.0)
Monocytes Relative: 7 %
Neutro Abs: 4.1 10*3/uL (ref 1.7–7.7)
Neutrophils Relative %: 52 %
Platelet Count: 267 10*3/uL (ref 150–400)
RBC: 4.45 MIL/uL (ref 3.87–5.11)
RDW: 15.4 % (ref 11.5–15.5)
WBC Count: 7.8 10*3/uL (ref 4.0–10.5)
nRBC: 0 % (ref 0.0–0.2)

## 2019-08-14 LAB — CMP (CANCER CENTER ONLY)
ALT: 52 U/L — ABNORMAL HIGH (ref 0–44)
AST: 33 U/L (ref 15–41)
Albumin: 4.6 g/dL (ref 3.5–5.0)
Alkaline Phosphatase: 99 U/L (ref 38–126)
Anion gap: 9 (ref 5–15)
BUN: 11 mg/dL (ref 6–20)
CO2: 27 mmol/L (ref 22–32)
Calcium: 10 mg/dL (ref 8.9–10.3)
Chloride: 106 mmol/L (ref 98–111)
Creatinine: 0.84 mg/dL (ref 0.44–1.00)
GFR, Est AFR Am: >60 mL/min (ref >60)
GFR, Estimated: >60 mL/min (ref >60)
Glucose, Bld: 101 mg/dL — ABNORMAL HIGH (ref 70–99)
Potassium: 3.4 mmol/L — ABNORMAL LOW (ref 3.5–5.1)
Sodium: 142 mmol/L (ref 135–145)
Total Bilirubin: 0.4 mg/dL (ref 0.3–1.2)
Total Protein: 7.4 g/dL (ref 6.5–8.1)

## 2019-08-27 ENCOUNTER — Telehealth: Payer: Self-pay

## 2019-08-27 NOTE — Telephone Encounter (Signed)
Spoke with pt by phone to let her know order for mammgram has been faxed to [facility] and they should be calling her this week to get her scheduled.  Pt verbalizes understanding.

## 2019-10-09 ENCOUNTER — Emergency Department (HOSPITAL_COMMUNITY)
Admission: EM | Admit: 2019-10-09 | Discharge: 2019-10-09 | Disposition: A | Discharge status: Home / Self Care | Payer: Medicaid Other | Attending: Emergency Medicine | Admitting: Emergency Medicine

## 2019-10-09 ENCOUNTER — Other Ambulatory Visit: Payer: Self-pay

## 2019-10-09 ENCOUNTER — Emergency Department (HOSPITAL_COMMUNITY): Payer: Medicaid Other

## 2019-10-09 DIAGNOSIS — F17210 Nicotine dependence, cigarettes, uncomplicated: Secondary | ICD-10-CM | POA: Diagnosis not present

## 2019-10-09 DIAGNOSIS — M7989 Other specified soft tissue disorders: Secondary | ICD-10-CM | POA: Diagnosis not present

## 2019-10-09 DIAGNOSIS — M25572 Pain in left ankle and joints of left foot: Secondary | ICD-10-CM | POA: Insufficient documentation

## 2019-10-09 DIAGNOSIS — Z853 Personal history of malignant neoplasm of breast: Secondary | ICD-10-CM | POA: Diagnosis not present

## 2019-10-09 DIAGNOSIS — I10 Essential (primary) hypertension: Secondary | ICD-10-CM | POA: Diagnosis not present

## 2019-10-09 DIAGNOSIS — Z79899 Other long term (current) drug therapy: Secondary | ICD-10-CM | POA: Insufficient documentation

## 2019-10-09 DIAGNOSIS — S99912A Unspecified injury of left ankle, initial encounter: Secondary | ICD-10-CM | POA: Diagnosis not present

## 2019-10-09 DIAGNOSIS — W109XXA Fall (on) (from) unspecified stairs and steps, initial encounter: Secondary | ICD-10-CM | POA: Insufficient documentation

## 2019-10-09 NOTE — ED Provider Notes (Signed)
[facility] DEPT Provider Note   CSN: [id number] Arrival date & time: 10/09/19  [id number]     History Chief Complaint  Patient presents with  . Foot Pain    Catherine [name] is a 59 y.o. female.  59 y.o female with a PMH of Anxiety, HTN, Breast CA presents to the ED status post fall yesterday.  Patient reports she was walking down the steps in order to pick up her son when she suddenly fell, reports she does not know which way her foot turned but reports pain along the ankle region.  She did take some Advil gel to help with her pain, reports there is significant improvement.  She noted more swelling to her left ankle today.  She has been able to weight-bear,and has been ambulating in the ED with a steady gate. No other injuries or prior history of gout.   The history is provided by the patient.  Foot Pain       Past Medical History:  Diagnosis Date  . Anxiety    pt very anxious on phone  . Breast cancer ([place])   . Breast cancer of upper-inner quadrant of left female breast ([place]) 06/09/2015  . Depression   . Hypertension     Patient Active Problem List   Diagnosis Date Noted  . Tobacco dependence 10/29/2016  . Need for hepatitis C screening test 10/29/2016  . Vaginal odor 10/29/2016  . Bacterial vaginitis 10/29/2016  . Malignant neoplasm of upper-inner quadrant of left breast in female, estrogen receptor positive ([place]) 06/09/2015  . BREAST PAIN 05/22/2008  . POPLITEAL CYST, LEFT 05/22/2008  . Mixed bipolar I disorder ([place]) 03/09/2007  . TOBACCO ABUSE 03/09/2007  . ELEVATED BLOOD PRESSURE WITHOUT DIAGNOSIS OF HYPERTENSION 03/09/2007  . Hyperlipidemia LDL goal <100 12/19/2006  . Internal derangement of knee 10/06/2006    Past Surgical History:  Procedure Laterality Date  . BREAST SURGERY    . EVACUATION BREAST HEMATOMA Left 07/02/2015   Procedure: EVACUATION HEMATOMA LEFT Mastectomy Site;  Surgeon: Judeth Horn, MD;  Location: [place];  Service:  General;  Laterality: Left;  Marland Kitchen MASTECTOMY W/ SENTINEL NODE BIOPSY Left 07/01/2015   Procedure: LEFT MASTECTOMY WITH SENTINEL LYMPH NODE BIOPSY;  Surgeon: [name], MD;  Location: [place] [facility];  Service: General;  Laterality: Left;  . PORT-A-CATH REMOVAL Right 09/04/2015   Procedure: REMOVAL PORT-A-CATH;  Surgeon: [name], MD;  Location: [place];  Service: General;  Laterality: Right;  . PORTACATH PLACEMENT N/A 07/01/2015   Procedure: INSERTION PORT-A-CATH;  Surgeon: [name], MD;  [facility];  Service: General;  Laterality: N/A;     OB History    Gravida  5   Para      Term      Preterm      AB  3   Living  2     SAB  1   TAB  2   Ectopic      Multiple      Live Births  2           Family History  Problem Relation Age of Onset  . Hypertension Mother   . Hypertension Father   . Hypertension Sister   . Hypertension Brother     Social History   Tobacco Use  . Smoking status: Current Every Day Smoker    Packs/day: 1.00    Years: 30.00    Pack years: 30.00    Types: Cigarettes  . Smokeless  tobacco: Never Used  Substance Use Topics  . Alcohol use: No    Alcohol/week: 1.0 standard drinks    Types: 1 Glasses of wine per week    Comment: daily  . Drug use: No    Home Medications Prior to Admission medications   Medication Sig Start Date End Date Taking? Authorizing Provider  atorvastatin (LIPITOR) 20 MG tablet Take 1 tablet (20 mg total) by mouth daily. 03/30/19 03/29/20  Lanae Boast, FNP  hydrochlorothiazide (HYDRODIURIL) 12.5 MG tablet Take 1 tablet (12.5 mg total) by mouth daily. 06/05/19   Tresa Garter, MD    Allergies    Codeine and Metronidazole  Review of Systems   Review of Systems  Constitutional: Negative for fever.  Musculoskeletal: Positive for arthralgias.    Physical Exam Updated Vital Signs BP 129/90   Pulse 81   Temp 98.7 F (37.1 C) (Oral)   Resp 15   LMP 06/30/2013   SpO2 97%    Physical Exam Vitals and nursing note reviewed.  Constitutional:      General: She is not in acute distress.    Appearance: She is well-developed.  HENT:     Head: Normocephalic and atraumatic.     Mouth/Throat:     Pharynx: No oropharyngeal exudate.  Cardiovascular:     Rate and Rhythm: Regular rhythm.     Heart sounds: Normal heart sounds.  Pulmonary:     Effort: Pulmonary effort is normal. No respiratory distress.     Breath sounds: Normal breath sounds.  Abdominal:     General: Bowel sounds are normal. There is no distension.     Palpations: Abdomen is soft.     Tenderness: There is no abdominal tenderness.  Musculoskeletal:        General: No deformity.     Cervical back: Normal range of motion.     Right lower leg: No edema.     Left lower leg: No swelling, deformity, lacerations, tenderness or bony tenderness. No edema.     Left ankle: Swelling present. No deformity, ecchymosis or lacerations. Tenderness present over the lateral malleolus. No medial malleolus, CF ligament, posterior TF ligament or base of 5th metatarsal tenderness. Normal range of motion.     Comments: Pulses present capillary refill is intact, full ROM with pain, callus present. TTp along the lateral malleolus with swelling present.   Skin:    General: Skin is warm and dry.  Neurological:     Mental Status: She is alert and oriented to person, place, and time.     ED Results / Procedures / Treatments   Labs (all labs ordered are listed, but only abnormal results are displayed) Labs Reviewed - No data to display  EKG None  Radiology DG Ankle Complete Left  Result Date: 10/09/2019 CLINICAL DATA:  Left ankle pain after fall yesterday. EXAM: LEFT ANKLE COMPLETE - 3+ VIEW COMPARISON:  None. FINDINGS: Small ossific density dorsal to the talar head on the lateral view with some overlying soft tissue swelling. No dislocation. The ankle mortise is symmetric. The talar dome is intact. Small tibiotalar  joint effusion. Joint spaces are preserved. Bone mineralization is normal. IMPRESSION: 1. Small ossific density dorsal to the talar head with some overlying soft tissue swelling, suspicious for tiny avulsion fracture. Correlate with point tenderness. Electronically Signed   By: Titus Dubin M.D.   On: 10/09/2019 10:52    Procedures Procedures (including critical care time)  Medications Ordered in ED Medications - No data to  display  ED Course  I have reviewed the triage vital signs and the nursing notes.  Pertinent labs & imaging results that were available during my care of the patient were reviewed by me and considered in my medical decision making (see chart for details).    MDM Rules/Calculators/A&P                      Patient presents to the ED status post fall yesterday.  Reports she injured her left foot when ambulating down the steps at home, she reports swelling to the area which she noted today, has taken Advil which did help with her symptoms.  She has been able to weight-bear but does note some discomfort along the lateral malleolus.  Evaluation of her left foot formed by me, pulses are present, capillary refill is intact, she does have full range of motion, she is neurovascularly intact without any obvious deformity or laceration noted.  No prior history of gout, there was trauma involved.  X-ray of her left foot evaluated by me showed small tiny fracture.    Patient was informed of these results, will be provided with an Ace wrap her left ankle for comfort.  Advise for rice therapy.  Orthopedic follow-up recommended if this is not improved within the week.  Patient shows and agrees with management, return precautions discussed at length.   Portions of this note were generated with [employer]. Dictation errors may occur despite best attempts at proofreading.  Final Clinical Impression(s) / ED Diagnoses Final diagnoses:  Acute left ankle pain    Rx / DC  Orders ED Discharge Orders    None       Janeece Fitting, PA-C 10/09/19 1111    Fredia Sorrow, MD 10/10/19 [phone]

## 2019-10-09 NOTE — Discharge Instructions (Addendum)
You xray showed a small avulsion fracture. We have provided an ace wrap to your left ankle, you will need to keep your left foot elevated along with apply ice to the area of swelling will decrease with these methods.  The number to orthopedic specialist attached to your chart, if your symptoms not improved within a week please schedule an appointment to see them.

## 2019-10-09 NOTE — ED Triage Notes (Signed)
Left foot pain-- patient states that she tripped off some steps yesterday injuring LLE. +can bear weight, mild swelling and pain does not radiate. Pain 10/10, mild relief with Aleve.

## 2019-10-09 NOTE — ED Notes (Signed)
Patient transported to XR. 

## 2019-10-10 MED FILL — HYDROCHLOROTHIAZIDE 12.5 MG: 12.5 | 90 days supply | Qty: 90 | Fill #1

## 2019-10-24 ENCOUNTER — Ambulatory Visit: Payer: No Typology Code available for payment source | Admitting: Nurse Practitioner

## 2020-01-03 ENCOUNTER — Ambulatory Visit: Payer: No Typology Code available for payment source | Admitting: Nurse Practitioner

## 2020-01-04 DIAGNOSIS — C50912 Malignant neoplasm of unspecified site of left female breast: Secondary | ICD-10-CM | POA: Diagnosis not present

## 2020-01-11 ENCOUNTER — Ambulatory Visit: Payer: No Typology Code available for payment source | Admitting: Nurse Practitioner

## 2020-01-15 DIAGNOSIS — C50912 Malignant neoplasm of unspecified site of left female breast: Secondary | ICD-10-CM | POA: Diagnosis not present

## 2020-01-18 ENCOUNTER — Other Ambulatory Visit: Payer: Self-pay

## 2020-01-18 ENCOUNTER — Encounter: Payer: Self-pay | Admitting: Nurse Practitioner

## 2020-01-18 ENCOUNTER — Ambulatory Visit (INDEPENDENT_AMBULATORY_CARE_PROVIDER_SITE_OTHER): Payer: Medicaid Other | Admitting: Nurse Practitioner

## 2020-01-18 VITALS — BP 119/75 | Temp 97.5°F | Ht 66.5 in | Wt 157.6 lb

## 2020-01-18 DIAGNOSIS — S92302A Fracture of unspecified metatarsal bone(s), left foot, initial encounter for closed fracture: Secondary | ICD-10-CM | POA: Diagnosis not present

## 2020-01-18 DIAGNOSIS — E782 Mixed hyperlipidemia: Secondary | ICD-10-CM

## 2020-01-18 DIAGNOSIS — Z0000 Encounter for general adult medical examination without abnormal findings: Secondary | ICD-10-CM | POA: Diagnosis not present

## 2020-01-18 DIAGNOSIS — M899 Disorder of bone, unspecified: Secondary | ICD-10-CM | POA: Diagnosis not present

## 2020-01-18 DIAGNOSIS — S92302S Fracture of unspecified metatarsal bone(s), left foot, sequela: Secondary | ICD-10-CM

## 2020-01-18 DIAGNOSIS — I10 Essential (primary) hypertension: Secondary | ICD-10-CM | POA: Diagnosis not present

## 2020-01-18 DIAGNOSIS — M79675 Pain in left toe(s): Secondary | ICD-10-CM

## 2020-01-18 DIAGNOSIS — N951 Menopausal and female climacteric states: Secondary | ICD-10-CM | POA: Diagnosis not present

## 2020-01-18 DIAGNOSIS — N760 Acute vaginitis: Secondary | ICD-10-CM | POA: Diagnosis not present

## 2020-01-18 DIAGNOSIS — Z789 Other specified health status: Secondary | ICD-10-CM

## 2020-01-18 NOTE — Progress Notes (Signed)
[facility] [place], [place]  [address] Phone:  [phone]   Fax:  [phone]   Established Patient Office Visit  Subjective:  Patient ID: Catherine [name], female    DOB: 1960/08/26  Age: 59 y.o. MRN: [id number]  CC:  Chief Complaint  Patient presents with  . Follow-up    pt fell on march 2 , hurt foot , had x-ray pain left great toe     [facility] Catherine [name] presents for follow up.   Subjective:    Catherine [name] is a 59 y.o. female who presents with left foot pain. Onset of the symptoms was several weeks ago. Precipitating event: injured in March she suffered a fall which resulted in the fracture of 4th digit on the left foot. She admits that it has not healed well. . Current symptoms include: pain in her "big toe" on her left foot.. Aggravating factors: walking. Symptoms have stabilized. Patient has had prior foot problems. Evaluation to date: plain films: pending. Treatment to date: avoidance of offending activity.  She has a history of left breast cancer and is SP left mastectomy and is planning to undergo reconstructive surgery in the upcoming weeks. "She is going through menopause".   Past Medical History:  Diagnosis Date  . Anxiety    pt very anxious on phone  . Breast cancer ([place])   . Breast cancer of upper-inner quadrant of left female breast ([place]) 06/09/2015  . Depression   . Hypertension     Past Surgical History:  Procedure Laterality Date  . BREAST SURGERY    . EVACUATION BREAST HEMATOMA Left 07/02/2015   Procedure: EVACUATION HEMATOMA LEFT Mastectomy Site;  Surgeon: Judeth Horn, MD;  Location: [place];  Service: General;  Laterality: Left;  Marland Kitchen MASTECTOMY W/ SENTINEL NODE BIOPSY Left 07/01/2015   Procedure: LEFT MASTECTOMY WITH SENTINEL LYMPH NODE BIOPSY;  Surgeon: [name], MD;  Location: [facility];  Service: General;  Laterality: Left;  . PORT-A-CATH REMOVAL Right 09/04/2015   Procedure: REMOVAL PORT-A-CATH;  Surgeon:  [name], MD;  Location: [facility];  Service: General;  Laterality: Right;  . PORTACATH PLACEMENT N/A 07/01/2015   Procedure: INSERTION PORT-A-CATH;  Surgeon: [name], MD;  [facility];  Service: General;  Laterality: N/A;    Family History  Problem Relation Age of Onset  . Hypertension Mother   . Hypertension Father   . Hypertension Sister   . Hypertension Brother     Social History   Socioeconomic History  . Marital status: Single    Spouse name: Not on file  . Number of children: Not on file  . Years of education: Not on file  . Highest education level: Not on file  Occupational History  . Not on file  Tobacco Use  . Smoking status: Current Every Day Smoker    Packs/day: 1.00    Years: 30.00    Pack years: 30.00    Types: Cigarettes  . Smokeless tobacco: Never Used  Vaping Use  . Vaping Use: Never used  Substance and Sexual Activity  . Alcohol use: No    Alcohol/week: 1.0 standard drink    Types: 1 Glasses of wine per week    Comment: daily  . Drug use: No  . Sexual activity: Not Currently  Other Topics Concern  . Not on file  Social History Narrative  . Not on file   Social Determinants of Health   Financial Resource  Strain:   . Difficulty of Paying Living Expenses:   Food Insecurity:   . Worried About Charity fundraiser in the Last Year:   . Arboriculturist in the Last Year:   Transportation Needs:   . Film/video editor (Medical):   Marland Kitchen Lack of Transportation (Non-Medical):   Physical Activity:   . Days of Exercise per Week:   . Minutes of Exercise per Session:   Stress:   . Feeling of Stress :   Social Connections:   . Frequency of Communication with Friends and Family:   . Frequency of Social Gatherings with Friends and Family:   . Attends Religious Services:   . Active Member of Clubs or Organizations:   . Attends Archivist Meetings:   Marland Kitchen Marital Status:   Intimate Partner Violence:   . Fear of  Current or Ex-Partner:   . Emotionally Abused:   Marland Kitchen Physically Abused:   . Sexually Abused:     Outpatient Medications Prior to Visit  Medication Sig Dispense Refill  . atorvastatin (LIPITOR) 20 MG tablet Take 1 tablet (20 mg total) by mouth daily. 90 tablet 3  . hydrochlorothiazide (HYDRODIURIL) 12.5 MG tablet Take 1 tablet (12.5 mg total) by mouth daily. 90 tablet 2   No facility-administered medications prior to visit.    Allergies  Allergen Reactions  . Codeine Nausea And Vomiting  . Metronidazole Nausea And Vomiting    ROS Review of Systems    Objective:    Physical Exam Constitutional:      Appearance: She is normal weight.  HENT:     Head: Normocephalic.  Cardiovascular:     Rate and Rhythm: Normal rate and regular rhythm.     Pulses: Normal pulses.          Dorsalis pedis pulses are 2+ on the left side.       Posterior tibial pulses are 2+ on the left side.     Heart sounds: Normal heart sounds.  Pulmonary:     Effort: Pulmonary effort is normal.     Comments: diminished breath sounds Musculoskeletal:     Cervical back: Normal range of motion.     Right foot: Prominent metatarsal heads present.     Left foot: Deformity, bunion and prominent metatarsal heads present.  Feet:     Left foot:     Skin integrity: Skin integrity normal.  Skin:    General: Skin is warm and dry.  Neurological:     Mental Status: She is alert and oriented to person, place, and time.  Psychiatric:        Mood and Affect: Mood normal.        Behavior: Behavior normal.        Thought Content: Thought content normal.        Judgment: Judgment normal.     BP 119/75 (BP Location: Left Arm, Patient Position: Sitting)   Temp (!) 97.5 F (36.4 C) (Temporal)   Ht 5' 6.5" (1.689 m)   Wt 157 lb 9.6 oz (71.5 kg)   LMP 06/30/2013   BMI 25.06 kg/m  Wt Readings from Last 3 Encounters:  01/18/20 157 lb 9.6 oz (71.5 kg)  08/14/19 156 lb 4.8 oz (70.9 kg)  03/26/19 152 lb 12.8 oz (69.3  kg)     Health Maintenance Due  Topic Date Due  . COVID-19 Vaccine (1) Never done  . TETANUS/TDAP  08/10/2007  . COLONOSCOPY  Never done  . MAMMOGRAM  06/22/2018  . PAP SMEAR-Modifier  06/19/2019    There are no preventive care reminders to display for this patient.  Lab Results  Component Value Date   TSH 1.440 01/18/2020   Lab Results  Component Value Date   WBC 6.5 01/18/2020   HGB 15.1 01/18/2020   HCT 47.0 (H) 01/18/2020   MCV 92 01/18/2020   PLT 287 01/18/2020   Lab Results  Component Value Date   NA 138 01/18/2020   K 4.4 01/18/2020   CHLORIDE 107 08/10/2017   CO2 27 08/14/2019   GLUCOSE 80 01/18/2020   BUN 13 01/18/2020   CREATININE 0.99 01/18/2020   BILITOT 0.3 01/18/2020   ALKPHOS 98 01/18/2020   AST 20 01/18/2020   ALT 52 (H) 08/14/2019   PROT 7.5 01/18/2020   ALBUMIN 4.9 01/18/2020   CALCIUM 10.3 (H) 01/18/2020   ANIONGAP 9 08/14/2019   EGFR >60 08/10/2017   Lab Results  Component Value Date   CHOL 163 01/18/2020   Lab Results  Component Value Date   HDL 54 01/18/2020   Lab Results  Component Value Date   LDLCALC 84 01/18/2020   Lab Results  Component Value Date   TRIG 142 01/18/2020   Lab Results  Component Value Date   CHOLHDL 3.0 01/18/2020   No results found for: HGBA1C    Assessment & Plan:   Problem List Items Addressed This Visit    None    Visit Diagnoses    Acute vaginitis    -  Primary   Relevant Orders   NuSwab Vaginitis Plus (VG+)   Mixed hyperlipidemia       Relevant Orders   Lipid panel (Completed)   Essential hypertension       Relevant Orders   Comp. Metabolic Panel (12) (Completed)   Healthcare maintenance       Great toe pain, left       Relevant Orders   DG Foot Complete Left   Sedimentation rate (Completed)   Ambulatory referral to Podiatry   Closed avulsion fracture of metatarsal bone of left foot, sequela       reevaluation with foot xray.    Relevant Orders   Ambulatory referral to Podiatry    Menopausal symptoms       Relevant Orders   CBC with Differential/Platelet (Completed)   Vitamin B12 (Completed)   Magnesium (Completed)   TSH (Completed)   Problems influencing health status       Relevant Orders   Vitamin B12 (Completed)   Magnesium (Completed)   Disorder of skeletal system       Relevant Orders   Sedimentation rate (Completed)   Ambulatory referral to Podiatry      No orders of the defined types were placed in this encounter.   Follow-up: Return in about 4 months (around 05/19/2020).    Vevelyn Francois, NP

## 2020-01-18 NOTE — Patient Instructions (Addendum)
Vaginitis Vaginitis is a condition in which the vaginal tissue swells and becomes red (inflamed). This condition is most often caused by a change in the normal balance of bacteria and yeast that live in the vagina. This change causes an overgrowth of certain bacteria or yeast, which causes the inflammation. There are different types of vaginitis, but the most common types are:  Bacterial vaginosis.  Yeast infection (candidiasis).  Trichomoniasis vaginitis. This is a sexually transmitted disease (STD).  Viral vaginitis.  Atrophic vaginitis.  Allergic vaginitis. What are the causes? The cause of this condition depends on the type of vaginitis. It can be caused by:  Bacteria (bacterial vaginosis).  Yeast, which is a fungus (yeast infection).  A parasite (trichomoniasis vaginitis).  A virus (viral vaginitis).  Low hormone levels (atrophic vaginitis). Low hormone levels can occur during pregnancy, breastfeeding, or after menopause.  Irritants, such as bubble baths, scented tampons, and feminine sprays (allergic vaginitis). Other factors can change the normal balance of the yeast and bacteria that live in the vagina. These include:  Antibiotic medicines.  Poor hygiene.  Diaphragms, vaginal sponges, spermicides, birth control pills, and intrauterine devices (IUD).  Sex.  Infection.  Uncontrolled diabetes.  A weakened defense (immune) system. What increases the risk? This condition is more likely to develop in women who:  Smoke.  Use vaginal douches, scented tampons, or scented sanitary pads.  Wear tight-fitting pants.  Wear thong underwear.  Use oral birth control pills or an IUD.  Have sex without a condom.  Have multiple sex partners.  Have an STD.  Frequently use the spermicide nonoxynol-9.  Eat lots of foods high in sugar.  Have uncontrolled diabetes.  Have low estrogen levels.  Have a weakened immune system from an immune disorder or medical  treatment.  Are pregnant or breastfeeding. What are the signs or symptoms? Symptoms vary depending on the cause of the vaginitis. Common symptoms include:  Abnormal vaginal discharge. ? The discharge is white, gray, or yellow with bacterial vaginosis. ? The discharge is thick, white, and cheesy with a yeast infection. ? The discharge is frothy and yellow or greenish with trichomoniasis.  A bad vaginal smell. The smell is fishy with bacterial vaginosis.  Vaginal itching, pain, or swelling.  Sex that is painful.  Pain or burning when urinating. Sometimes there are no symptoms. How is this diagnosed? This condition is diagnosed based on your symptoms and medical history. A physical exam, including a pelvic exam, will also be done. You may also have other tests, including:  Tests to determine the pH level (acidity or alkalinity) of your vagina.  A whiff test, to assess the odor that results when a sample of your vaginal discharge is mixed with a potassium hydroxide solution.  Tests of vaginal fluid. A sample will be examined under a microscope. How is this treated? Treatment varies depending on the type of vaginitis you have. Your treatment may include:  Antibiotic creams or pills to treat bacterial vaginosis and trichomoniasis.  Antifungal medicines, such as vaginal creams or suppositories, to treat a yeast infection.  Medicine to ease discomfort if you have viral vaginitis. Your sexual partner should also be treated.  Estrogen delivered in a cream, pill, suppository, or vaginal ring to treat atrophic vaginitis. If vaginal dryness occurs, lubricants and moisturizing creams may help. You may need to avoid scented soaps, sprays, or douches.  Stopping use of a product that is causing allergic vaginitis. Then using a vaginal cream to treat the symptoms. Follow   these instructions at home: Lifestyle  Keep your genital area clean and dry. Avoid soap, and only rinse the area with  water.  Do not douche or use tampons until your health care provider says it is okay to do so. Use sanitary pads, if needed.  Do not have sex until your health care provider approves. When you can return to sex, practice safe sex and use condoms.  Wipe from front to back. This avoids the spread of bacteria from the rectum to the vagina. General instructions  Take over-the-counter and prescription medicines only as told by your health care provider.  If you were prescribed an antibiotic medicine, take or use it as told by your health care provider. Do not stop taking or using the antibiotic even if you start to feel better.  Keep all follow-up visits as told by your health care provider. This is important. How is this prevented?  Use mild, non-scented products. Do not use things that can irritate the vagina, such as fabric softeners. Avoid the following products if they are scented: ? Feminine sprays. ? Detergents. ? Tampons. ? Feminine hygiene products. ? Soaps or bubble baths.  Let air reach your genital area. ? Wear cotton underwear to reduce moisture buildup. ? Avoid wearing underwear while you sleep. ? Avoid wearing tight pants and underwear or nylons without a cotton panel. ? Avoid wearing thong underwear.  Take off any wet clothing, such as bathing suits, as soon as possible.  Practice safe sex and use condoms. Contact a health care provider if:  You have abdominal pain.  You have a fever.  You have symptoms that last for more than 2-3 days. Get help right away if:  You have a fever and your symptoms suddenly get worse. Summary  Vaginitis is a condition in which the vaginal tissue becomes inflamed.This condition is most often caused by a change in the normal balance of bacteria and yeast that live in the vagina.  Treatment varies depending on the type of vaginitis you have.  Do not douche, use tampons , or have sex until your health care provider approves. When  you can return to sex, practice safe sex and use condoms. This information is not intended to replace advice given to you by your health care provider. Make sure you discuss any questions you have with your health care provider. Document Revised: 07/08/2017 Document Reviewed: 08/31/2016 Elsevier Patient Education  2020 [employer]  

## 2020-01-19 LAB — COMP. METABOLIC PANEL (12)
AST: 20 IU/L (ref 0–40)
Albumin/Globulin Ratio: 1.9 (ref 1.2–2.2)
Albumin: 4.9 g/dL (ref 3.8–4.9)
Alkaline Phosphatase: 98 IU/L (ref 48–121)
BUN/Creatinine Ratio: 13 (ref 9–23)
BUN: 13 mg/dL (ref 6–24)
Bilirubin Total: 0.3 mg/dL (ref 0.0–1.2)
Calcium: 10.3 mg/dL — ABNORMAL HIGH (ref 8.7–10.2)
Chloride: 101 mmol/L (ref 96–106)
Creatinine, Ser: 0.99 mg/dL (ref 0.57–1.00)
GFR calc Af Amer: 72 mL/min/{1.73_m2} (ref >59)
GFR calc non Af Amer: 63 mL/min/{1.73_m2} (ref >59)
Globulin, Total: 2.6 g/dL (ref 1.5–4.5)
Glucose: 80 mg/dL (ref 65–99)
Potassium: 4.4 mmol/L (ref 3.5–5.2)
Sodium: 138 mmol/L (ref 134–144)
Total Protein: 7.5 g/dL (ref 6.0–8.5)

## 2020-01-19 LAB — CBC WITH DIFFERENTIAL/PLATELET
Basophils Absolute: 0 10*3/uL (ref 0.0–0.2)
Basos: 1 %
EOS (ABSOLUTE): 0 10*3/uL (ref 0.0–0.4)
Eos: 1 %
Hematocrit: 47 % — ABNORMAL HIGH (ref 34.0–46.6)
Hemoglobin: 15.1 g/dL (ref 11.1–15.9)
Immature Grans (Abs): 0 10*3/uL (ref 0.0–0.1)
Immature Granulocytes: 0 %
Lymphocytes Absolute: 3.2 10*3/uL — ABNORMAL HIGH (ref 0.7–3.1)
Lymphs: 48 %
MCH: 29.5 pg (ref 26.6–33.0)
MCHC: 32.1 g/dL (ref 31.5–35.7)
MCV: 92 fL (ref 79–97)
Monocytes Absolute: 0.5 10*3/uL (ref 0.1–0.9)
Monocytes: 8 %
Neutrophils Absolute: 2.7 10*3/uL (ref 1.4–7.0)
Neutrophils: 42 %
Platelets: 287 10*3/uL (ref 150–450)
RBC: 5.12 x10E6/uL (ref 3.77–5.28)
RDW: 15.1 % (ref 11.7–15.4)
WBC: 6.5 10*3/uL (ref 3.4–10.8)

## 2020-01-19 LAB — LIPID PANEL
Chol/HDL Ratio: 3 ratio (ref 0.0–4.4)
Cholesterol, Total: 163 mg/dL (ref 100–199)
HDL: 54 mg/dL (ref >39)
LDL Chol Calc (NIH): 84 mg/dL (ref 0–99)
Triglycerides: 142 mg/dL (ref 0–149)
VLDL Cholesterol Cal: 25 mg/dL (ref 5–40)

## 2020-01-19 LAB — VITAMIN B12: Vitamin B-12: 749 pg/mL (ref 232–1245)

## 2020-01-19 LAB — MAGNESIUM: Magnesium: 2.1 mg/dL (ref 1.6–2.3)

## 2020-01-19 LAB — SEDIMENTATION RATE: Sed Rate: 9 mm/hr (ref 0–40)

## 2020-01-19 LAB — TSH: TSH: 1.44 u[IU]/mL (ref 0.450–4.500)

## 2020-01-21 LAB — NUSWAB VAGINITIS PLUS (VG+)
Candida albicans, NAA: NEGATIVE
Candida glabrata, NAA: NEGATIVE
Chlamydia trachomatis, NAA: NEGATIVE
Neisseria gonorrhoeae, NAA: NEGATIVE
Trich vag by NAA: NEGATIVE

## 2020-01-24 ENCOUNTER — Ambulatory Visit: Payer: No Typology Code available for payment source | Admitting: Podiatry

## 2020-01-29 DIAGNOSIS — C50912 Malignant neoplasm of unspecified site of left female breast: Secondary | ICD-10-CM | POA: Diagnosis not present

## 2020-02-08 MED FILL — ATORVASTATIN CALCIUM 20 MG: 20 | 30 days supply | Qty: 30 | Fill #2

## 2020-02-08 MED FILL — HYDROCHLOROTHIAZIDE 12.5 MG: 12.5 | 30 days supply | Qty: 30 | Fill #2

## 2020-02-15 DIAGNOSIS — Z1231 Encounter for screening mammogram for malignant neoplasm of breast: Secondary | ICD-10-CM | POA: Diagnosis not present

## 2020-02-21 ENCOUNTER — Ambulatory Visit: Payer: No Typology Code available for payment source | Admitting: Podiatry

## 2020-02-29 ENCOUNTER — Telehealth: Payer: Self-pay | Admitting: Podiatry

## 2020-02-29 ENCOUNTER — Other Ambulatory Visit: Payer: Self-pay

## 2020-02-29 ENCOUNTER — Ambulatory Visit (INDEPENDENT_AMBULATORY_CARE_PROVIDER_SITE_OTHER): Payer: Medicaid Other

## 2020-02-29 ENCOUNTER — Encounter: Payer: Self-pay | Admitting: Podiatry

## 2020-02-29 ENCOUNTER — Ambulatory Visit (INDEPENDENT_AMBULATORY_CARE_PROVIDER_SITE_OTHER): Payer: Medicaid Other | Admitting: Podiatry

## 2020-02-29 DIAGNOSIS — M7751 Other enthesopathy of right foot: Secondary | ICD-10-CM

## 2020-02-29 DIAGNOSIS — M7752 Other enthesopathy of left foot: Secondary | ICD-10-CM

## 2020-02-29 DIAGNOSIS — M79675 Pain in left toe(s): Secondary | ICD-10-CM

## 2020-02-29 DIAGNOSIS — M19079 Primary osteoarthritis, unspecified ankle and foot: Secondary | ICD-10-CM

## 2020-02-29 DIAGNOSIS — M779 Enthesopathy, unspecified: Secondary | ICD-10-CM

## 2020-02-29 DIAGNOSIS — S92251S Displaced fracture of navicular [scaphoid] of right foot, sequela: Secondary | ICD-10-CM

## 2020-02-29 NOTE — Progress Notes (Signed)
  Subjective:  Patient ID: Catherine [name], female    DOB: 08/12/1960,  MRN: [id number]  Chief Complaint  Patient presents with  . Foot Problem    the left big toe has a bunion and does hurt on the side of the foot and the top of the foot has a bone chipped    59 y.o. female presents with the above complaint. History confirmed with patient.  Previous chip of the bone in March 2021 after coming down the steps.  Also complains of pain in the big toe since April 2021.  States that she also has pain on the outside of the left foot with swelling feeling cold numbness and tingling burning and throbbing soreness and tenderness.  Objective:  Physical Exam: warm, good capillary refill, no trophic changes or ulcerative lesions, normal DP and PT pulses and normal sensory exam. Left Foot: Pain to palpation to the left hallux IPJ, left fifth met base  No images are attached to the encounter.  Radiographs: X-ray of the right foot: No degenerative changes navicular avulsion fragment noted age-indeterminate Assessment:   1. Arthritis of joint of toe   2. Pain of great toe, left   3. Enthesitis   4. Closed avulsion fracture of navicular bone of right foot, sequela      Plan:  Patient was evaluated and treated and all questions answered.  Capsulitis -Educated on etiology -XR reviewed with patient -Injection delivered to the painful joint  Procedure: Joint Injection Location: Left hallux IPJ joint Skin Prep: Alcohol. Injectate: 0.5 cc 1% lidocaine plain, 0.5 cc dexamethasone phosphate. Disposition: Patient tolerated procedure ok. Injection site dressed with a band-aid.  Enthesitis -Offered injection, patient declined.  States her bipolar disorder prevents her from going through too much at one visit   Return in about 6 weeks (around 04/11/2020) for Capsulitis.

## 2020-02-29 NOTE — Telephone Encounter (Signed)
Pt called and wanted to know how long to keep bandage on after injection and what type of arthritis please assist

## 2020-03-03 NOTE — Telephone Encounter (Signed)
Please advise. Thanks Armistead Sult 

## 2020-03-03 NOTE — Telephone Encounter (Signed)
I called and left a message for the patient and relayed the message per Dr March Rummage. Catherine [name]

## 2020-03-03 NOTE — Telephone Encounter (Signed)
She can remove whenever. 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

## 2020-04-18 ENCOUNTER — Ambulatory Visit: Payer: Medicaid Other | Admitting: Podiatry

## 2020-05-14 ENCOUNTER — Other Ambulatory Visit: Payer: Self-pay | Admitting: Nurse Practitioner

## 2020-05-14 ENCOUNTER — Other Ambulatory Visit: Payer: Self-pay | Admitting: Family Medicine

## 2020-05-14 ENCOUNTER — Telehealth: Payer: Self-pay | Admitting: Nurse Practitioner

## 2020-05-14 DIAGNOSIS — I10 Essential (primary) hypertension: Secondary | ICD-10-CM

## 2020-05-14 DIAGNOSIS — E785 Hyperlipidemia, unspecified: Secondary | ICD-10-CM

## 2020-05-14 MED ORDER — HYDROCHLOROTHIAZIDE 12.5 MG PO TABS: 12.5000 mg | ORAL_TABLET | Freq: Every day | ORAL | 3 refills | Status: DC

## 2020-05-14 MED FILL — HYDROCHLOROTHIAZIDE 12.5 MG: 12.5 | 30 days supply | Qty: 30 | Fill #3

## 2020-05-14 NOTE — Telephone Encounter (Signed)
Sent!

## 2020-05-19 ENCOUNTER — Ambulatory Visit: Payer: No Typology Code available for payment source | Admitting: Nurse Practitioner

## 2020-06-16 ENCOUNTER — Ambulatory Visit: Payer: No Typology Code available for payment source | Admitting: Nurse Practitioner

## 2020-07-18 ENCOUNTER — Other Ambulatory Visit: Payer: Self-pay

## 2020-07-18 ENCOUNTER — Encounter: Payer: Self-pay | Admitting: Nurse Practitioner

## 2020-07-18 ENCOUNTER — Ambulatory Visit (INDEPENDENT_AMBULATORY_CARE_PROVIDER_SITE_OTHER): Payer: No Typology Code available for payment source | Admitting: Nurse Practitioner

## 2020-07-18 VITALS — BP 143/80 | HR 69 | Temp 97.6°F | Resp 16 | Ht 66.5 in | Wt 157.0 lb

## 2020-07-18 DIAGNOSIS — R2232 Localized swelling, mass and lump, left upper limb: Secondary | ICD-10-CM

## 2020-07-18 DIAGNOSIS — I10 Essential (primary) hypertension: Secondary | ICD-10-CM

## 2020-07-18 DIAGNOSIS — E782 Mixed hyperlipidemia: Secondary | ICD-10-CM

## 2020-07-18 DIAGNOSIS — M899 Disorder of bone, unspecified: Secondary | ICD-10-CM

## 2020-07-18 DIAGNOSIS — M79674 Pain in right toe(s): Secondary | ICD-10-CM

## 2020-07-18 NOTE — Progress Notes (Signed)
[place], [place]  [address] Phone:  [phone]   Fax:  [phone]   Established Patient Office Visit  Subjective:  Patient ID: Catherine [name], female    DOB: 1961-06-17  Age: 59 y.o. MRN: [id number]  CC: No chief complaint on file.   HPI [name] presents for left thumb. She  has a past medical history of Anxiety, Breast cancer ([place]), Breast cancer of upper-inner quadrant of left female breast ([place]) (06/09/2015), Depression, and Hypertension.   Epidermal Cyst Patient complains of a subcutaneous nodule located over the hand.  This has been present for a few weeks.  There has been no associated symptoms of discharge, tenderness, sudden swelling, or pain.  Patient does have a history of epidermal inclusion cysts. She continues to have foot pain however it is now in her right foot. She has problems ambulating long distances due to the pain in her feet and left knee.   Past Medical History:  Diagnosis Date  . Anxiety    pt very anxious on phone  . Breast cancer ([place])   . Breast cancer of upper-inner quadrant of left female breast ([place]) 06/09/2015  . Depression   . Hypertension     Past Surgical History:  Procedure Laterality Date  . BREAST SURGERY    . EVACUATION BREAST HEMATOMA Left 07/02/2015   Procedure: EVACUATION HEMATOMA LEFT Mastectomy Site;  Surgeon: Judeth Horn, MD;  Location: [place] [facility];  Service: General;  Laterality: Left;  Marland Kitchen MASTECTOMY W/ SENTINEL NODE BIOPSY Left 07/01/2015   Procedure: LEFT MASTECTOMY WITH SENTINEL LYMPH NODE BIOPSY;  Surgeon: [name], MD;  Location: [facility];  Service: General;  Laterality: Left;  . PORT-A-CATH REMOVAL Right 09/04/2015   Procedure: REMOVAL PORT-A-CATH;  Surgeon: [name], MD;  Location: [facility];  Service: General;  Laterality: Right;  . PORTACATH PLACEMENT N/A 07/01/2015   Procedure: INSERTION PORT-A-CATH;  Surgeon: [name], MD;  [facility];   Service: General;  Laterality: N/A;    Family History  Problem Relation Age of Onset  . Hypertension Mother   . Hypertension Father   . Hypertension Sister   . Hypertension Brother     Social History   Socioeconomic History  . Marital status: Single    Spouse name: Not on file  . Number of children: Not on file  . Years of education: Not on file  . Highest education level: Not on file  Occupational History  . Not on file  Tobacco Use  . Smoking status: Current Every Day Smoker    Packs/day: 1.00    Years: 30.00    Pack years: 30.00    Types: Cigarettes  . Smokeless tobacco: Never Used  Vaping Use  . Vaping Use: Never used  Substance and Sexual Activity  . Alcohol use: No    Alcohol/week: 1.0 standard drink    Types: 1 Glasses of wine per week    Comment: daily  . Drug use: No  . Sexual activity: Not Currently  Other Topics Concern  . Not on file  Social History Narrative  . Not on file   Social Determinants of Health   Financial Resource Strain: Not on file  Food Insecurity: Not on file  Transportation Needs: Not on file  Physical Activity: Not on file  Stress: Not on file  Social Connections: Not on file  Intimate Partner Violence: Not on file    Outpatient Medications Prior to  Visit  Medication Sig Dispense Refill  . hydrochlorothiazide (HYDRODIURIL) 12.5 MG tablet Take 1 tablet (12.5 mg total) by mouth daily. 90 tablet 3   No facility-administered medications prior to visit.    Allergies  Allergen Reactions  . Codeine Nausea And Vomiting  . Metronidazole Nausea And Vomiting    ROS Review of Systems  Musculoskeletal: Positive for arthralgias and joint swelling.       Left foot and left knee       Objective:    Physical Exam HENT:     Head: Normocephalic and atraumatic.     Nose: Nose normal.     Mouth/Throat:     Mouth: Mucous membranes are moist.  Cardiovascular:     Rate and Rhythm: Normal rate and regular rhythm.     Pulses:  Normal pulses.     Heart sounds: Normal heart sounds.  Pulmonary:     Effort: Pulmonary effort is normal.     Breath sounds: Normal breath sounds.  Musculoskeletal:     Left hand: Deformity (left index finger) present.     Cervical back: Normal range of motion.     Right foot: Decreased range of motion. Bunion present.     Left foot: Decreased range of motion. Bunion present.  Feet:     Right foot:     Toenail Condition: Right toenails are long.     Left foot:     Toenail Condition: Left toenails are long.  Neurological:     Mental Status: She is alert.     BP (!) 143/80 (BP Location: Right Arm, Patient Position: Sitting, Cuff Size: Normal)   Pulse 69   Temp 97.6 F (36.4 C) (Temporal)   Resp 16   Ht 5' 6.5" (1.689 m)   Wt 157 lb (71.2 kg)   LMP 06/30/2013   SpO2 100%   BMI 24.96 kg/m  Wt Readings from Last 3 Encounters:  07/18/20 157 lb (71.2 kg)  01/18/20 157 lb 9.6 oz (71.5 kg)  08/14/19 156 lb 4.8 oz (70.9 kg)     Health Maintenance Due  Topic Date Due  . MAMMOGRAM  06/22/2018  . PAP SMEAR-Modifier  06/19/2019  . COVID-19 Vaccine (3 - Pfizer risk 4-dose series) 04/24/2020    There are no preventive care reminders to display for this patient.  Lab Results  Component Value Date   TSH 1.440 01/18/2020   Lab Results  Component Value Date   WBC 6.5 01/18/2020   HGB 15.1 01/18/2020   HCT 47.0 (H) 01/18/2020   MCV 92 01/18/2020   PLT 287 01/18/2020   Lab Results  Component Value Date   NA 138 01/18/2020   K 4.4 01/18/2020   CHLORIDE 107 08/10/2017   CO2 27 08/14/2019   GLUCOSE 80 01/18/2020   BUN 13 01/18/2020   CREATININE 0.99 01/18/2020   BILITOT 0.3 01/18/2020   ALKPHOS 98 01/18/2020   AST 20 01/18/2020   ALT 52 (H) 08/14/2019   PROT 7.5 01/18/2020   ALBUMIN 4.9 01/18/2020   CALCIUM 10.3 (H) 01/18/2020   ANIONGAP 9 08/14/2019   EGFR >60 08/10/2017   Lab Results  Component Value Date   CHOL 163 01/18/2020   Lab Results  Component Value  Date   HDL 54 01/18/2020   Lab Results  Component Value Date   LDLCALC 84 01/18/2020   Lab Results  Component Value Date   TRIG 142 01/18/2020   Lab Results  Component Value Date   CHOLHDL 3.0  01/18/2020   No results found for: HGBA1C    Assessment & Plan:   Problem List Items Addressed This Visit   None   Visit Diagnoses    Essential hypertension    -  Primary Encouraged on going compliance with current medication regimen Encouraged home monitoring and recording BP <130/80 Eating a heart-healthy diet with less salt Encouraged regular physical activity     Relevant Orders   Comp. Metabolic Panel (12)   Disorder of skeletal system    Evaluation of OA and Gout    Relevant Orders   Sedimentation rate   Uric Acid   Mixed hyperlipidemia       Relevant Orders   Lipid panel   Great toe pain, right       Nodule of finger of left hand   Education material provided along with discussion on cyst        No orders of the defined types were placed in this encounter.   Follow-up: Return in about 6 months (around 01/16/2021).    Vevelyn Francois, NP

## 2020-08-30 NOTE — Progress Notes (Signed)
[facility]  [phone] [phone]     ID: Catherine [name] DOB: [birth year]  MR#: [id number]  DSK#:[id number]  Patient Care Team: Vevelyn Francois, NP as PCP - General (Adult Health Nurse Practitioner) [name], MD as Consulting Physician (General Surgery) Magrinat, Virgie Dad, MD as Consulting Physician (Oncology) Arloa Koh, MD (Inactive) as Consulting Physician (Radiation Oncology) Mauro Kaufmann, RN as Registered Nurse Rockwell Germany, RN as Registered Nurse OTHER MD:   CHIEF COMPLAINT: Locally advanced breast cancer (s/p left mastectomy)  CURRENT TREATMENT: Observation.   INTERVAL HISTORY: [name] was scheduled today for follow-up of her estrogen receptor positive breast cancer.  However she did not show.   REVIEW OF SYSTEMS: Samantha    COVID 87 VACCINATION STATUS: s/p Pfizer x 2    BREAST CANCER HISTORY: From the original intake note:  Catherine [name] tells me she has always had many breast cysts, and that the 1 in the left breast that turned out to be cancer didn't seem any different from any of the other ones. She did notice some skin dimpling so after a few months she brought it to the attention of Dr. Criss Rosales and on 05/28/2015 she underwent bilateral diagnostic mammography with tomosynthesis and left breast ultrasonography at [facility], [place]. The most recent mammogram prior to this was 2009. The breast density was category C. In the left breast upper inner quadrant there was a 4 cm irregular mass associated with architectural distortion and nipple retraction. By ultrasound this measured 4 cm, and had micro-lobulated margins. There was a lymph node in the left axilla with a focus of cortical thickening.  On 06/05/2015 the patient underwent biopsy of the breast mass in question. This showed (SAA [id number]) and invasive ductal carcinoma, grade 2 or 3, estrogen receptor 95% positive, progesterone receptor 40% positive, both with strong staining intensity,  with an MIB-1 of 20%, and HER-2 equivocal for amplification, the signals ratio being 1.43 but the number per cell 4.38. Additional studies are pending to clarify the HER-2 status.  Biopsy of the abnormal lymph node in the left axilla was also scheduled for 06/05/2015 but the patient refused  The patient's subsequent history is as detailed below    PAST MEDICAL HISTORY: Past Medical History:  Diagnosis Date   Anxiety    pt very anxious on phone   Breast cancer [facility])    Breast cancer of upper-inner quadrant of left female breast ([place]) 06/09/2015   Depression    Hypertension     PAST SURGICAL HISTORY: Past Surgical History:  Procedure Laterality Date   BREAST SURGERY     EVACUATION BREAST HEMATOMA Left 07/02/2015   Procedure: EVACUATION HEMATOMA LEFT Mastectomy Site;  Surgeon: Judeth Horn, MD;  Location: [facility];  Service: General;  Laterality: Left;   MASTECTOMY W/ SENTINEL NODE BIOPSY Left 07/01/2015   Procedure: LEFT MASTECTOMY WITH SENTINEL LYMPH NODE BIOPSY;  Surgeon: [name], MD;  Location: [facility];  Service: General;  Laterality: Left;   PORT-A-CATH REMOVAL Right 09/04/2015   Procedure: REMOVAL PORT-A-CATH;  Surgeon: [name], MD;  Location: [facility];  Service: General;  Laterality: Right;   PORTACATH PLACEMENT N/A 07/01/2015   Procedure: INSERTION PORT-A-CATH;  Surgeon: [name], MD;  [facility];  Service: General;  Laterality: N/A;    FAMILY HISTORY Family History  Problem Relation Age of Onset   Hypertension Mother    Hypertension Father    Hypertension Sister    Hypertension Brother    The  patient's parents are in their mid to late 65s. [name] had 4 brothers, 2 sisters. There is a history of prostate and brain cancer on the maternal side but no history of ovarian or breast cancer   GYNECOLOGIC HISTORY:  Patient's last menstrual period was 06/30/2013. Menarche age 39, first live birth age 58. The patient is GX P2. She  stopped having periods at age 31. She did not take hormone replacement. She never used oral contraceptives.   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  She is on disability because of her bipolar disorder.  At home is just she and her "baby" Catherine [name], 77.  He is autistic she tells me.  He is going to school. Son Catherine [name], 80, works for the [employer] and lives in [place]. The patient has 2 grandchildren. 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    ADVANCED DIRECTIVES: Not in place. At the initial clinic visit the patient was given the appropriate forms to complete and notarize at her discretion.   HEALTH MAINTENANCE: Social History   Tobacco Use   Smoking status: Current Every Day Smoker    Packs/day: 1.00    Years: 30.00    Pack years: 30.00    Types: Cigarettes   Smokeless tobacco: Never Used  Vaping Use   Vaping Use: Never used  Substance Use Topics   Alcohol use: No    Alcohol/week: 1.0 standard drink    Types: 1 Glasses of wine per week    Comment: daily   Drug use: No     Colonoscopy: Never  PAP: 06/18/2016: Negative for intraepithelial lesion or malignancy, no HPV detected  Bone density: Never  Lipid panel:  Allergies  Allergen Reactions   Codeine Nausea And Vomiting   Metronidazole Nausea And Vomiting    Current Outpatient Medications  Medication Sig Dispense Refill   hydrochlorothiazide (HYDRODIURIL) 12.5 MG tablet Take 1 tablet (12.5 mg total) by mouth daily. 90 tablet 3   No current facility-administered medications for this visit.    OBJECTIVE:   There were no vitals filed for this visit.   There is no height or weight on file to calculate BMI.    ECOG FS:    LAB RESULTS:  CMP     Component Value Date/Time   NA 138 01/18/2020 0911   NA 142 08/10/2017 0846   K 4.4 01/18/2020 0911   K 4.3 08/10/2017 0846   CL 101  01/18/2020 0911   CO2 27 08/14/2019 0846   CO2 27 08/10/2017 0846   GLUCOSE 80 01/18/2020 0911   GLUCOSE 101 (H) 08/14/2019 0846   GLUCOSE 88 08/10/2017 0846   BUN 13 01/18/2020 0911   BUN 12.0 08/10/2017 0846   CREATININE 0.99 01/18/2020 0911   CREATININE 0.84 08/14/2019 0846   CREATININE 0.9 08/10/2017 0846   CALCIUM 10.3 (H) 01/18/2020 0911   CALCIUM 10.2 08/10/2017 0846   PROT 7.5 01/18/2020 0911   PROT 7.5 08/10/2017 0846   ALBUMIN 4.9 01/18/2020 0911   ALBUMIN 4.3 08/10/2017 0846   AST 20 01/18/2020 0911   AST 33 08/14/2019 0846   AST 15 08/10/2017 0846   ALT 52 (H) 08/14/2019 0846   ALT 19 08/10/2017 0846   ALKPHOS 98 01/18/2020 0911   ALKPHOS 122 08/10/2017 0846   BILITOT 0.3 01/18/2020 0911   BILITOT 0.4 08/14/2019 0846   BILITOT 0.22 08/10/2017 0846   GFRNONAA 63  01/18/2020 0911   GFRNONAA >60 08/14/2019 0846   GFRNONAA 70 03/25/2017 1015   GFRAA 72 01/18/2020 0911   GFRAA >60 08/14/2019 0846   GFRAA 80 03/25/2017 1015    INo results found for: SPEP, UPEP  Lab Results  Component Value Date   WBC 6.5 01/18/2020   NEUTROABS 2.7 01/18/2020   HGB 15.1 01/18/2020   HCT 47.0 (H) 01/18/2020   MCV 92 01/18/2020   PLT 287 01/18/2020      Chemistry      Component Value Date/Time   NA 138 01/18/2020 0911   NA 142 08/10/2017 0846   K 4.4 01/18/2020 0911   K 4.3 08/10/2017 0846   CL 101 01/18/2020 0911   CO2 27 08/14/2019 0846   CO2 27 08/10/2017 0846   BUN 13 01/18/2020 0911   BUN 12.0 08/10/2017 0846   CREATININE 0.99 01/18/2020 0911   CREATININE 0.84 08/14/2019 0846   CREATININE 0.9 08/10/2017 0846      Component Value Date/Time   CALCIUM 10.3 (H) 01/18/2020 0911   CALCIUM 10.2 08/10/2017 0846   ALKPHOS 98 01/18/2020 0911   ALKPHOS 122 08/10/2017 0846   AST 20 01/18/2020 0911   AST 33 08/14/2019 0846   AST 15 08/10/2017 0846   ALT 52 (H) 08/14/2019 0846   ALT 19 08/10/2017 0846   BILITOT 0.3 01/18/2020 0911   BILITOT 0.4 08/14/2019 0846    BILITOT 0.22 08/10/2017 0846       No results found for: LABCA2  No components found for: BULAG536  No results for input(s): INR in the last 168 hours.  Urinalysis    Component Value Date/Time   LABSPEC 1.020 09/30/2017 1125   PHURINE 5.5 09/30/2017 1125   GLUCOSEU NEGATIVE 09/30/2017 1125   HGBUR TRACE (A) 09/30/2017 1125   BILIRUBINUR small 06/09/2018 0918   KETONESUR NEGATIVE 09/30/2017 1125   PROTEINUR Positive (A) 06/09/2018 0918   PROTEINUR NEGATIVE 09/30/2017 1125   UROBILINOGEN 1.0 06/09/2018 0918   UROBILINOGEN 0.2 09/30/2017 1125   NITRITE neg 06/09/2018 0918   NITRITE NEGATIVE 09/30/2017 1125   LEUKOCYTESUR Negative 06/09/2018 0918    STUDIES: No results found.   ASSESSMENT: 60 y.o. [place] woman status post left breast upper inner quadrant biopsy 06/05/2015 for a clinical T2 NX, stage II or III invasive ductal carcinoma, estrogen and progesterone receptor positive, with an MIB-1 of 20%, and equivocal HER-2 amplification  (a) the patient refused biopsy of a suspicious left axillary lymph node  (b) bone scan 02/11/2016 showed no evidence of bony metastases.  (1) status post left mastectomy and sentinel lymph node sampling 07/01/2015 for a pT3 pN0, stage IIB invasive ductal carcinoma, grade 2, repeat estrogen and progesterone receptor positive, HER-2 not amplified by FISH, with an MIB-1 of 30%.  (a) a total of 4 left sentinel lymph nodes removed  (2) Oncotype DX score of 16 predicts a risk of recurrence outside the breast within 10 years of 10% if the patient's only systemic therapy is tamoxifen for 5 years. It also predicts no significant benefit from chemotherapy.  (3) adjuvant radiation 10/06/15-11/14/15 Site/dose:    Left chest wall / 50.4 Gray @ 1.8 Gray per fraction x 28 fractions  (4) tamoxifen taken from 11/07/15 to 12/17/15. Discontinued because of vaginal irritation and itchiness.  (5) anastrozole prescribed 01/07/16, never started by patient   (5)  bipolar disorder: The patient refuses medication at this time  (6) tobacco abuse disorder: Discussed in detail   PLAN: Trystin did not  show for her 09/01/2020 visit. A follow-up letter has been sent.   Magrinat, Virgie Dad, MD  08/30/20 10:33 PM Medical Oncology and Hematology [facility] [place], [place] [address] Tel. [phone]    Fax. [phone]   I, Wilburn Mylar, am acting as scribe for Dr. Virgie Dad. Magrinat.  I, Lurline Del MD, have reviewed the above documentation for accuracy and completeness, and I agree with the above.   *Total Encounter Time as defined by the Centers for Medicare and Medicaid Services includes, in addition to the face-to-face time of a patient visit (documented in the note above) non-face-to-face time: obtaining and reviewing outside history, ordering and reviewing medications, tests or procedures, care coordination (communications with other health care professionals or caregivers) and documentation in the medical record.

## 2020-09-01 ENCOUNTER — Encounter: Payer: Self-pay | Admitting: Oncology

## 2020-09-01 ENCOUNTER — Inpatient Hospital Stay: Payer: Medicaid Other

## 2020-09-01 ENCOUNTER — Inpatient Hospital Stay: Payer: Medicaid Other | Attending: Oncology | Admitting: Oncology

## 2020-09-01 DIAGNOSIS — C50212 Malignant neoplasm of upper-inner quadrant of left female breast: Secondary | ICD-10-CM

## 2020-09-01 DIAGNOSIS — Z170 Estrogen receptor positive status [ER+]: Secondary | ICD-10-CM

## 2020-09-01 DIAGNOSIS — F17200 Nicotine dependence, unspecified, uncomplicated: Secondary | ICD-10-CM

## 2020-09-01 DIAGNOSIS — F3160 Bipolar disorder, current episode mixed, unspecified: Secondary | ICD-10-CM

## 2020-09-15 ENCOUNTER — Telehealth: Payer: Self-pay | Admitting: Nurse Practitioner

## 2020-09-15 ENCOUNTER — Other Ambulatory Visit: Payer: Self-pay | Admitting: Nurse Practitioner

## 2020-09-15 DIAGNOSIS — I10 Essential (primary) hypertension: Secondary | ICD-10-CM

## 2020-09-15 MED ORDER — HYDROCHLOROTHIAZIDE 12.5 MG PO TABS: 12.5000 mg | ORAL_TABLET | Freq: Every day | ORAL | 3 refills | Status: DC

## 2020-09-15 MED FILL — HYDROCHLOROTHIAZIDE 12.5 MG: 12.5 | 90 days supply | Qty: 90 | Fill #0

## 2020-09-15 NOTE — Telephone Encounter (Signed)
sent 

## 2020-12-03 ENCOUNTER — Encounter: Payer: Self-pay | Admitting: Oncology

## 2020-12-30 ENCOUNTER — Other Ambulatory Visit: Payer: Self-pay

## 2020-12-30 ENCOUNTER — Ambulatory Visit
Admission: EM | Admit: 2020-12-30 | Discharge: 2020-12-30 | Disposition: A | Discharge status: Home / Self Care | Payer: Medicaid Other | Attending: Emergency Medicine | Admitting: Emergency Medicine

## 2020-12-30 DIAGNOSIS — M546 Pain in thoracic spine: Secondary | ICD-10-CM

## 2020-12-30 DIAGNOSIS — M545 Low back pain, unspecified: Secondary | ICD-10-CM

## 2020-12-30 MED ORDER — IBUPROFEN 800 MG PO TABS: 800.0000 mg | ORAL_TABLET | Freq: Three times a day (TID) | ORAL | 0 refills | Status: AC

## 2020-12-30 MED ORDER — TIZANIDINE HCL 2 MG PO TABS: 2.0000 mg | ORAL_TABLET | Freq: Every day | ORAL | 0 refills | Status: AC

## 2020-12-30 MED ORDER — IBUPROFEN 800 MG PO TABS
800.0000 mg | ORAL_TABLET | Freq: Once | ORAL | Status: AC
  Administered 2020-12-30: 800 mg via ORAL
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
## 2020-12-30 NOTE — Discharge Instructions (Addendum)
Use anti-inflammatories for pain/swelling. You may take up to 800 mg Ibuprofen every 8 hours with food. You may supplement Ibuprofen with Tylenol [phone] mg every 8 hours.  You may use tizanidine as needed to help with pain. This is a muscle relaxer and causes sedation- please use only at bedtime or when you will be home and not have to drive/work Alternate ice and heat Gentle stretching Follow-up with nonimprovement worsening
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
## 2021-01-01 ENCOUNTER — Telehealth: Payer: Self-pay
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
## 2021-01-06 ENCOUNTER — Other Ambulatory Visit: Payer: Self-pay

## 2021-01-06 ENCOUNTER — Ambulatory Visit
Admission: EM | Admit: 2021-01-06 | Discharge: 2021-01-06 | Disposition: A | Discharge status: Home / Self Care | Payer: Medicaid Other | Attending: Family Medicine | Admitting: Family Medicine

## 2021-01-06 ENCOUNTER — Encounter: Payer: Self-pay | Admitting: Emergency Medicine

## 2021-01-06 DIAGNOSIS — M5440 Lumbago with sciatica, unspecified side: Secondary | ICD-10-CM

## 2021-01-06 MED ORDER — DICLOFENAC SODIUM 1 % EX GEL: 4.0000 g | Freq: Four times a day (QID) | CUTANEOUS | 0 refills | Status: AC

## 2021-01-06 NOTE — ED Triage Notes (Signed)
5/24 seen and treated.  One medicine made her sick.  One med didn't help and very abrupt about needing something else for pain.

## 2021-01-06 NOTE — ED Provider Notes (Signed)
[name]    CSN: [id number] Arrival date & time: 01/06/21  0910      History   Chief Complaint No chief complaint on file.   HPI Catherine [name] is a 60 y.o. female.   HPI  Patient presents today for evaluation of ongoing back pain.  She was recently seen here at urgent care however reports that the muscle relaxer induced confusion and ibuprofen takes the pain away temporarily and she simply dislikes taking pills.  She is here requesting a gel and recommendations to help with pain.  Past Medical History:  Diagnosis Date  . Anxiety    pt very anxious on phone  . Breast cancer ([place])   . Breast cancer of upper-inner quadrant of left female breast ([place]) 06/09/2015  . Depression   . Hypertension     Patient Active Problem List   Diagnosis Date Noted  . Tobacco dependence 10/29/2016  . Need for hepatitis C screening test 10/29/2016  . Malignant neoplasm of upper-inner quadrant of left breast in female, estrogen receptor positive (Catherine [name]) 06/09/2015  . BREAST PAIN 05/22/2008  . POPLITEAL CYST, LEFT 05/22/2008  . Mixed bipolar I disorder (Catherine [name]) 03/09/2007  . TOBACCO ABUSE 03/09/2007  . ELEVATED BLOOD PRESSURE WITHOUT DIAGNOSIS OF HYPERTENSION 03/09/2007  . Hyperlipidemia LDL goal <100 12/19/2006  . Internal derangement of knee 10/06/2006    Past Surgical History:  Procedure Laterality Date  . BREAST SURGERY    . EVACUATION BREAST HEMATOMA Left 07/02/2015   Procedure: EVACUATION HEMATOMA LEFT Mastectomy Site;  Surgeon: Catherine Horn, MD;  Location: [place];  Service: General;  Laterality: Left;  Catherine [name] MASTECTOMY W/ SENTINEL NODE BIOPSY Left 07/01/2015   Procedure: LEFT MASTECTOMY WITH SENTINEL LYMPH NODE BIOPSY;  Surgeon: Catherine Klein, MD;  Location: [place];  Service: General;  Laterality: Left;  . PORT-A-CATH REMOVAL Right 09/04/2015   Procedure: REMOVAL PORT-A-CATH;  Surgeon: Catherine Klein, MD;  Location: [place];  Service: General;  Laterality: Right;   . PORTACATH PLACEMENT N/A 07/01/2015   Procedure: INSERTION PORT-A-CATH;  Surgeon: Catherine Klein, MD;  Location: [place];  Service: General;  Laterality: N/A;    OB History    Gravida  5   Para      Term      Preterm      AB  3   Living  2     SAB  1   IAB  2   Ectopic      Multiple      Live Births  2            Home Medications    Prior to Admission medications   Medication Sig Start Date End Date Taking? Authorizing Provider  hydrochlorothiazide (HYDRODIURIL) 12.5 MG tablet Take 1 tablet (12.5 mg total) by mouth daily. 09/15/20   Vevelyn Francois, NP  hydrochlorothiazide (MICROZIDE) 12.5 MG capsule TAKE 1 TABLET (12.5 MG TOTAL) BY MOUTH DAILY. 09/15/20 09/15/21  Vevelyn Francois, NP  hydrochlorothiazide (MICROZIDE) 12.5 MG capsule TAKE 1 CAPSULE (12.5 MG TOTAL) BY MOUTH DAILY. 05/14/20 05/14/21  Vevelyn Francois, NP  ibuprofen (ADVIL) 800 MG tablet Take 1 tablet (800 mg total) by mouth 3 (three) times daily. 12/30/20   Wieters, Hallie C, PA-C  tiZANidine (ZANAFLEX) 2 MG tablet Take 1-2 tablets (2-4 mg total) by mouth at bedtime. 12/30/20   Wieters, Elesa Hacker, PA-C    Family History Family History  Problem Relation Age of Onset  . Hypertension Mother   .  Hypertension Father   . Hypertension Sister   . Hypertension Brother     Social History Social History   Tobacco Use  . Smoking status: Current Every Day Smoker    Packs/day: 1.00    Years: 30.00    Pack years: 30.00    Types: Cigarettes  . Smokeless tobacco: Never Used  Vaping Use  . Vaping Use: Never used  Substance Use Topics  . Alcohol use: No    Alcohol/week: 1.0 standard drink    Types: 1 Glasses of wine per week    Comment: daily  . Drug use: No     Allergies   Codeine and Metronidazole   Review of Systems Review of Systems Pertinent negatives listed in HPI   Physical Exam Triage Vital Signs ED Triage Vitals [01/06/21 1147]  Enc Vitals Group     BP 131/83     Pulse Rate 72     Resp  18     Temp 98.2 F (36.8 C)     Temp Source Oral     SpO2 98 %     Weight      Height      Head Circumference      Peak Flow      Pain Score      Pain Loc      Pain Edu?      Excl. in [place]?    No data found.  Updated Vital Signs BP 131/83 (BP Location: Left Arm)   Pulse 72   Temp 98.2 F (36.8 C) (Oral)   Resp 18   LMP 06/30/2013   SpO2 98%   Visual Acuity Right Eye Distance:   Left Eye Distance:   Bilateral Distance:    Right Eye Near:   Left Eye Near:    Bilateral Near:     Physical Exam  General appearance: Alert, well developed, well nourished, cooperative Head: Normocephalic, without obvious abnormality, atraumatic Respiratory: Respirations even and unlabored, normal respiratory rate Heart: Rate and rhythm normal. No gallop or murmurs noted on exam  Extremities: Spinal process midline no deformity  Skin: Skin color, texture, turgor normal. No rashes seen  Psych: Appropriate mood and affect. Neurologic: GCS 15, antalgic gait, normal coordination  UC Treatments / Results  Labs (all labs ordered are listed, but only abnormal results are displayed) Labs Reviewed - No data to display  EKG   Radiology No results found.  Procedures Procedures (including critical care time)  Medications Ordered in UC Medications - No data to display  Initial Impression / Assessment and Plan / UC Course  I have reviewed the triage vital signs and the nursing notes.  Pertinent labs & imaging results that were available during my care of the patient were reviewed by me and considered in my medical decision making (see chart for details).     Acute back pain with sciatica patient requested Voltaren gel declined a burst of steroids.  Also recommended conservative management of pain with lidocaine patches over-the-counter.  Intermittent ibuprofen as needed.  Patient sees her primary care doctor next week and states that she will at that time discussed possible  referrals. Final Clinical Impressions(s) / UC Diagnoses   Final diagnoses:  Acute back pain with sciatica, unspecified laterality   Discharge Instructions   None    ED Prescriptions    Medication Sig Dispense Auth. Provider   diclofenac Sodium (VOLTAREN) 1 % GEL Apply 4 g topically 4 (four) times daily. 350 g Scot Jun,  FNP     PDMP not reviewed this encounter.   Scot Jun, FNP 01/06/21 1254

## 2021-01-16 ENCOUNTER — Ambulatory Visit (INDEPENDENT_AMBULATORY_CARE_PROVIDER_SITE_OTHER): Payer: No Typology Code available for payment source | Admitting: Nurse Practitioner

## 2021-01-16 ENCOUNTER — Other Ambulatory Visit: Payer: Self-pay

## 2021-01-16 ENCOUNTER — Other Ambulatory Visit: Payer: Self-pay | Admitting: Nurse Practitioner

## 2021-01-16 ENCOUNTER — Encounter: Payer: Self-pay | Admitting: Nurse Practitioner

## 2021-01-16 VITALS — BP 132/99 | HR 87 | Temp 97.2°F | Wt 142.0 lb

## 2021-01-16 DIAGNOSIS — M545 Low back pain, unspecified: Secondary | ICD-10-CM

## 2021-01-16 DIAGNOSIS — C50212 Malignant neoplasm of upper-inner quadrant of left female breast: Secondary | ICD-10-CM

## 2021-01-16 DIAGNOSIS — R634 Abnormal weight loss: Secondary | ICD-10-CM

## 2021-01-16 DIAGNOSIS — M899 Disorder of bone, unspecified: Secondary | ICD-10-CM

## 2021-01-16 DIAGNOSIS — G8929 Other chronic pain: Secondary | ICD-10-CM

## 2021-01-16 DIAGNOSIS — R7989 Other specified abnormal findings of blood chemistry: Secondary | ICD-10-CM

## 2021-01-16 DIAGNOSIS — I10 Essential (primary) hypertension: Secondary | ICD-10-CM

## 2021-01-16 DIAGNOSIS — Z170 Estrogen receptor positive status [ER+]: Secondary | ICD-10-CM

## 2021-01-16 DIAGNOSIS — Z1321 Encounter for screening for nutritional disorder: Secondary | ICD-10-CM

## 2021-01-16 DIAGNOSIS — Z1389 Encounter for screening for other disorder: Secondary | ICD-10-CM

## 2021-01-16 DIAGNOSIS — E782 Mixed hyperlipidemia: Secondary | ICD-10-CM

## 2021-01-16 LAB — POCT URINALYSIS DIPSTICK

## 2021-01-16 NOTE — Patient Instructions (Addendum)
Health Maintenance, Female Adopting a healthy lifestyle and getting preventive care are important in promoting health and wellness. Ask your health care provider about: The right schedule for you to have regular tests and exams. Things you can do on your own to prevent diseases and keep yourself healthy. What should I know about diet, weight, and exercise? Eat a healthy diet  Eat a diet that includes plenty of vegetables, fruits, low-fat dairy products, and lean protein. Do not eat a lot of foods that are high in solid fats, added sugars, or sodium.  Maintain a healthy weight Body mass index (BMI) is used to identify weight problems. It estimates body fat based on height and weight. Your health care provider can help determineyour BMI and help you achieve or maintain a healthy weight. Get regular exercise Get regular exercise. This is one of the most important things you can do for your health. Most adults should: Exercise for at least 150 minutes each week. The exercise should increase your heart rate and make you sweat (moderate-intensity exercise). Do strengthening exercises at least twice a week. This is in addition to the moderate-intensity exercise. Spend less time sitting. Even light physical activity can be beneficial. Watch cholesterol and blood lipids Have your blood tested for lipids and cholesterol at 60 years of age, then havethis test every 5 years. Have your cholesterol levels checked more often if: Your lipid or cholesterol levels are high. You are older than 60 years of age. You are at high risk for heart disease. What should I know about cancer screening? Depending on your health history and family history, you may need to have cancer screening at various ages. This may include screening for: Breast cancer. Cervical cancer. Colorectal cancer. Skin cancer. Lung cancer. What should I know about heart disease, diabetes, and high blood pressure? Blood pressure and heart  disease High blood pressure causes heart disease and increases the risk of stroke. This is more likely to develop in people who have high blood pressure readings, are of African descent, or are overweight. Have your blood pressure checked: Every 3-5 years if you are 18-39 years of age. Every year if you are 40 years old or older. Diabetes Have regular diabetes screenings. This checks your fasting blood sugar level. Have the screening done: Once every three years after age 40 if you are at a normal weight and have a low risk for diabetes. More often and at a younger age if you are overweight or have a high risk for diabetes. What should I know about preventing infection? Hepatitis B If you have a higher risk for hepatitis B, you should be screened for this virus. Talk with your health care provider to find out if you are at risk forhepatitis B infection. Hepatitis C Testing is recommended for: Everyone born from 1945 through 1965. Anyone with known risk factors for hepatitis C. Sexually transmitted infections (STIs) Get screened for STIs, including gonorrhea and chlamydia, if: You are sexually active and are younger than 60 years of age. You are older than 60 years of age and your health care provider tells you that you are at risk for this type of infection. Your sexual activity has changed since you were last screened, and you are at increased risk for chlamydia or gonorrhea. Ask your health care provider if you are at risk. Ask your health care provider about whether you are at high risk for HIV. Your health care provider may recommend a prescription medicine to help   prevent HIV infection. If you choose to take medicine to prevent HIV, you should first get tested for HIV. You should then be tested every 3 months for as long as you are taking the medicine. Pregnancy If you are about to stop having your period (premenopausal) and you may become pregnant, seek counseling before you get  pregnant. Take 400 to 800 micrograms (mcg) of folic acid every day if you become pregnant. Ask for birth control (contraception) if you want to prevent pregnancy. Osteoporosis and menopause Osteoporosis is a disease in which the bones lose minerals and strength with aging. This can result in bone fractures. If you are 72 years old or older, or if you are at risk for osteoporosis and fractures, ask your health care provider if you should: Be screened for bone loss. Take a calcium or vitamin D supplement to lower your risk of fractures. Be given hormone replacement therapy (HRT) to treat symptoms of menopause. Follow these instructions at home: Lifestyle Do not use any products that contain nicotine or tobacco, such as cigarettes, e-cigarettes, and chewing tobacco. If you need help quitting, ask your health care provider. Do not use street drugs. Do not share needles. Ask your health care provider for help if you need support or information about quitting drugs. Alcohol use Do not drink alcohol if: Your health care provider tells you not to drink. You are pregnant, may be pregnant, or are planning to become pregnant. If you drink alcohol: Limit how much you use to 0-1 drink a day. Limit intake if you are breastfeeding. Be aware of how much alcohol is in your drink. In the [place] one drink equals one 12 oz bottle of beer (355 mL), one 5 oz glass of wine (148 mL), or one 1 oz glass of hard liquor (44 mL). General instructions Schedule regular health, dental, and eye exams. Stay current with your vaccines. Tell your health care provider if: You often feel depressed. You have ever been abused or do not feel safe at home. Summary Adopting a healthy lifestyle and getting preventive care are important in promoting health and wellness. Follow your health care provider's instructions about healthy diet, exercising, and getting tested or screened for diseases. Follow your health care provider's  instructions on monitoring your cholesterol and blood pressure. This information is not intended to replace advice given to you by your health care provider. Make sure you discuss any questions you have with your healthcare provider. Document Revised: 07/19/2018 Document Reviewed: 07/19/2018 Elsevier Patient Education  2022 [place] Your Hypertension Hypertension, also called high blood pressure, is when the force of the blood pressing against the walls of the arteries is too strong. Arteries are blood vessels that carry blood from your heart throughout your body. Hypertension forces the heart to work harder to pump blood and may cause the arteries tobecome narrow or stiff. Understanding blood pressure readings Your personal target blood pressure may vary depending on your medical conditions, your age, and other factors. A blood pressure reading includes a higher number over a lower number. Ideally, your blood pressure should be below 120/80. You should know that: The first, or top, number is called the systolic pressure. It is a measure of the pressure in your arteries as your heart beats. The second, or bottom number, is called the diastolic pressure. It is a measure of the pressure in your arteries as the heart relaxes. Blood pressure is classified into four stages. Based on your blood pressure reading, your  health care provider may use the following stages to determine what type of treatment you need, if any. Systolic pressure and diastolicpressure are measured in a unit called mmHg. Normal Systolic pressure: below 353. Diastolic pressure: below 80. Elevated Systolic pressure: 614-431. Diastolic pressure: below 80. Hypertension stage 1 Systolic pressure: 540-086. Diastolic pressure: 76-19. Hypertension stage 2 Systolic pressure: 509 or above. Diastolic pressure: 90 or above. How can this condition affect me? Managing your hypertension is an important responsibility. Over  time, hypertension can damage the arteries and decrease blood flow to important parts of the body, including the brain, heart, and kidneys. Having untreated or uncontrolled hypertension can lead to: A heart attack. A stroke. A weakened blood vessel (aneurysm). Heart failure. Kidney damage. Eye damage. Metabolic syndrome. Memory and concentration problems. Vascular dementia. What actions can I take to manage this condition? Hypertension can be managed by making lifestyle changes and possibly by taking medicines. Your health care provider will help you make a plan to bring yourblood pressure within a normal range. Nutrition  Eat a diet that is high in fiber and potassium, and low in salt (sodium), added sugar, and fat. An example eating plan is called the Dietary Approaches to Stop Hypertension (DASH) diet. To eat this way: Eat plenty of fresh fruits and vegetables. Try to fill one-half of your plate at each meal with fruits and vegetables. Eat whole grains, such as whole-wheat pasta, brown rice, or whole-grain bread. Fill about one-fourth of your plate with whole grains. Eat low-fat dairy products. Avoid fatty cuts of meat, processed or cured meats, and poultry with skin. Fill about one-fourth of your plate with lean proteins such as fish, chicken without skin, beans, eggs, and tofu. Avoid pre-made and processed foods. These tend to be higher in sodium, added sugar, and fat. Reduce your daily sodium intake. Most people with hypertension should eat less than 1,500 mg of sodium a day.  Lifestyle  Work with your health care provider to maintain a healthy body weight or to lose weight. Ask what an ideal weight is for you. Get at least 30 minutes of exercise that causes your heart to beat faster (aerobic exercise) most days of the week. Activities may include walking, swimming, or biking. Include exercise to strengthen your muscles (resistance exercise), such as weight lifting, as part of your  weekly exercise routine. Try to do these types of exercises for 30 minutes at least 3 days a week. Do not use any products that contain nicotine or tobacco, such as cigarettes, e-cigarettes, and chewing tobacco. If you need help quitting, ask your health care provider. Control any long-term (chronic) conditions you have, such as high cholesterol or diabetes. Identify your sources of stress and find ways to manage stress. This may include meditation, deep breathing, or making time for fun activities.  Alcohol use Do not drink alcohol if: Your health care provider tells you not to drink. You are pregnant, may be pregnant, or are planning to become pregnant. If you drink alcohol: Limit how much you use to: 0-1 drink a day for women. 0-2 drinks a day for men. Be aware of how much alcohol is in your drink. In the [place] one drink equals one 12 oz bottle of beer (355 mL), one 5 oz glass of wine (148 mL), or one 1 oz glass of hard liquor (44 mL). Medicines Your health care provider may prescribe medicine if lifestyle changes are not enough to get your blood pressure under control and if: Your  systolic blood pressure is 130 or higher. Your diastolic blood pressure is 80 or higher. Take medicines only as told by your health care provider. Follow the directions carefully. Blood pressure medicines must be taken as told by your health care provider. The medicine does not work as well when you skip doses. Skippingdoses also puts you at risk for problems. Monitoring Before you monitor your blood pressure: Do not smoke, drink caffeinated beverages, or exercise within 30 minutes before taking a measurement. Use the bathroom and empty your bladder (urinate). Sit quietly for at least 5 minutes before taking measurements. Monitor your blood pressure at home as told by your health care provider. To do this: Sit with your back straight and supported. Place your feet flat on the floor. Do not cross your  legs. Support your arm on a flat surface, such as a table. Make sure your upper arm is at heart level. Each time you measure, take two or three readings one minute apart and record the results. You may also need to have your blood pressure checked regularly by your healthcare provider. General information Talk with your health care provider about your diet, exercise habits, and other lifestyle factors that may be contributing to hypertension. Review all the medicines you take with your health care provider because there may be side effects or interactions. Keep all visits as told by your health care provider. Your health care provider can help you create and adjust your plan for managing your high blood pressure. Where to find more information National Heart, Lung, and Blood Institute: https://wilson-eaton.com/ American Heart Association: www.heart.org Contact a health care provider if: You think you are having a reaction to medicines you have taken. You have repeated (recurrent) headaches. You feel dizzy. You have swelling in your ankles. You have trouble with your vision. Get help right away if: You develop a severe headache or confusion. You have unusual weakness or numbness, or you feel faint. You have severe pain in your chest or abdomen. You vomit repeatedly. You have trouble breathing. These symptoms may represent a serious problem that is an emergency. Do not wait to see if the symptoms will go away. Get medical help right away. Call your local emergency services (911 in the [place] Do not drive yourself to the hospital. Summary Hypertension is when the force of blood pumping through your arteries is too strong. If this condition is not controlled, it may put you at risk for serious complications. Your personal target blood pressure may vary depending on your medical conditions, your age, and other factors. For most people, a normal blood pressure is less than 120/80. Hypertension is  managed by lifestyle changes, medicines, or both. Lifestyle changes to help manage hypertension include losing weight, eating a healthy, low-sodium diet, exercising more, stopping smoking, and limiting alcohol. This information is not intended to replace advice given to you by your health care provider. Make sure you discuss any questions you have with your healthcare provider. Document Revised: 08/31/2019 Document Reviewed: 06/26/2019 Elsevier Patient Education  2022 [employer].

## 2021-01-16 NOTE — Progress Notes (Signed)
[address], [place]  [address] Phone:  [phone]   Fax:  [phone]   Established Patient Office Visit  Subjective:  Patient ID: Catherine [name], female    DOB: Dec 29, 1960  Age: 60 y.o. MRN: [id number]  CC: No chief complaint on file.   HPI Catherine [name] presents for follow up. 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   She is in today for follow-up.  Has a history of hypertension is currently on hydrochlorothiazide 12.5 mg.  She is concerned today about recent weight loss.  She has a history of breast cancer.  She was to follow-up with oncology in January however she missed her appointment and this has not been rescheduled.  She reports that the weight loss has made her anxiety worse.  She has started taking an over-the-counter One a day multivitamin.  She feels like the weight loss showed up after starting this. She is also concern that her cancer returned. She admits that her abdomen has gone down significantly. She is happy about this but is concern.   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[place]) 06/09/2015   Depression    Hypertension     Past Surgical History:  Procedure Laterality Date   BREAST SURGERY     EVACUATION BREAST HEMATOMA Left 07/02/2015   Procedure: EVACUATION HEMATOMA LEFT Mastectomy Site;  Surgeon: Judeth Horn, MD;  Location: [facility];  Service: General;  Laterality: Left;   MASTECTOMY W/ SENTINEL NODE BIOPSY Left 07/01/2015   Procedure: LEFT MASTECTOMY WITH SENTINEL LYMPH NODE BIOPSY;  Surgeon: [name], MD;  Location: [facility];  Service: General;  Laterality: Left;   PORT-A-CATH REMOVAL Right 09/04/2015   Procedure: REMOVAL PORT-A-CATH;  Surgeon: [name],  MD;  Location: [facility];  Service: General;  Laterality: Right;   PORTACATH PLACEMENT N/A 07/01/2015   Procedure: INSERTION PORT-A-CATH;  Surgeon: [name], MD;  Location: [facility];  Service: General;  Laterality: N/A;    Family History  Problem Relation Age of Onset   Hypertension Mother    Hypertension Father    Hypertension Sister    Hypertension Brother     Social History   Socioeconomic History   Marital status: Single    Spouse name: Not on file   Number of children: Not on file   Years of education: Not on file   Highest education level: Not on file  Occupational History   Not on file  Tobacco Use   Smoking status: Every Day    Packs/day: 1.00    Years: 30.00    Pack years: 30.00    Types: Cigarettes   Smokeless tobacco: Never  Vaping Use   Vaping Use: Never used  Substance and Sexual Activity   Alcohol use: No    Alcohol/week: 1.0 standard drink    Types: 1 Glasses of wine per week    Comment: daily   Drug use: No   Sexual activity: Not Currently  Other Topics Concern   Not on file  Social History Narrative   Not on file   Social Determinants of Health   Financial Resource Strain: Not on file  Food Insecurity: Not on file  Transportation Needs: Not on file  Physical Activity: Not on file  Stress: Not on file  Social Connections: Not on file  Intimate Partner Violence: Not on file    Outpatient Medications Prior to Visit  Medication Sig Dispense Refill   diclofenac Sodium (VOLTAREN) 1 % GEL Apply 4 g topically 4 (four) times daily. 350 g 0   hydrochlorothiazide (MICROZIDE) 12.5 MG capsule TAKE 1 CAPSULE (12.5 MG TOTAL) BY MOUTH DAILY. 90 capsule 3   ibuprofen (ADVIL) 800 MG tablet Take 1 tablet (800 mg total) by mouth 3 (three) times daily. (Patient not taking: Reported on 01/06/2021) 21 tablet 0   tiZANidine (ZANAFLEX) 2 MG tablet Take 1-2 tablets (2-4 mg total) by mouth at bedtime. (Patient not taking: Reported on 01/06/2021) 16  tablet 0   hydrochlorothiazide (HYDRODIURIL) 12.5 MG tablet Take 1 tablet (12.5 mg total) by mouth daily. 90 tablet 3   hydrochlorothiazide (MICROZIDE) 12.5 MG capsule TAKE 1 TABLET (12.5 MG TOTAL) BY MOUTH DAILY. 90 capsule 3   No facility-administered medications prior to visit.    Allergies  Allergen Reactions   Codeine Nausea And Vomiting   Metronidazole Nausea And Vomiting    ROS Review of Systems    Objective:    Physical Exam HENT:     Head: Normocephalic and atraumatic.     Nose: Nose normal.     Mouth/Throat:     Mouth: Mucous membranes are moist.  Cardiovascular:     Rate and Rhythm: Normal rate and regular rhythm.     Pulses: Normal pulses.     Heart sounds: Normal heart sounds.  Pulmonary:     Effort: Pulmonary effort is normal.  Abdominal:     Palpations: Abdomen is soft.     Comments: hypoactive  Musculoskeletal:        General: Normal range of motion.     Cervical back: Normal range of motion.  Skin:    General: Skin is warm and dry.     Capillary Refill: Capillary refill takes less than 2 seconds.  Neurological:     General: No focal deficit present.     Mental Status: She is alert and oriented to person, place, and time.  Psychiatric:     Comments: Anxious     BP (!) 132/99 (BP Location: Right Arm, Patient Position: Sitting, Cuff Size: Normal)   Pulse 87   Temp (!) 97.2 F (36.2 C)   Wt 142 lb (64.4 kg)   LMP 06/30/2013   SpO2 93%   BMI 22.58 kg/m  Wt Readings from Last 3 Encounters:  01/16/21 142 lb (64.4 kg)  07/18/20 157 lb (71.2 kg)  01/18/20 157 lb 9.6 oz (71.5 kg)     Health Maintenance Due  Topic Date Due   Pneumococcal Vaccine 86-23 Years old (1 - PCV) Never done   Zoster Vaccines- Shingrix (1 of 2) Never done   PAP SMEAR-Modifier  06/19/2019   COVID-19 Vaccine (3 - Pfizer risk series) 04/24/2020    There are no preventive care reminders to display for this patient.  Lab Results  Component Value Date   TSH 1.440  01/18/2020   Lab Results  Component Value Date   WBC 6.5 01/18/2020   HGB 15.1 01/18/2020   HCT 47.0 (H) 01/18/2020   MCV 92 01/18/2020   PLT 287 01/18/2020   Lab Results  Component Value Date   NA 138 01/18/2020   K 4.4 01/18/2020   CHLORIDE 107 08/10/2017   CO2 27 08/14/2019  GLUCOSE 80 01/18/2020   BUN 13 01/18/2020   CREATININE 0.99 01/18/2020   BILITOT 0.3 01/18/2020   ALKPHOS 98 01/18/2020   AST 20 01/18/2020   ALT 52 (H) 08/14/2019   PROT 7.5 01/18/2020   ALBUMIN 4.9 01/18/2020   CALCIUM 10.3 (H) 01/18/2020   ANIONGAP 9 08/14/2019   EGFR >60 08/10/2017   Lab Results  Component Value Date   CHOL 163 01/18/2020   Lab Results  Component Value Date   HDL 54 01/18/2020   Lab Results  Component Value Date   LDLCALC 84 01/18/2020   Lab Results  Component Value Date   TRIG 142 01/18/2020   Lab Results  Component Value Date   CHOLHDL 3.0 01/18/2020   No results found for: HGBA1C    Assessment & Plan:   Problem List Items Addressed This Visit       Other   Malignant neoplasm of upper-inner quadrant of left breast in female, estrogen receptor positive ([place]) Attempted to get her an apt with Oncologist is out of town. CA 27.29   Relevant Orders   Cancer antigen 27.29   Other Visit Diagnoses     Essential hypertension    -  Primary Encouraged on going compliance with current medication regimen Encouraged home monitoring and recording BP <130/80 Eating a heart-healthy diet with less salt Encouraged regular physical activity  Recommend Weight loss   Relevant Orders   Comp. Metabolic Panel (12)   Disorder of skeletal system       Relevant Orders   VITAMIN D 25 Hydroxy (Vit-D Deficiency, Fractures)   Sedimentation rate   Mixed hyperlipidemia       Relevant Orders   Lipid panel   Weight loss     Scale was not accurate Weight loss 6 pounds   Relevant Orders   CBC with Differential/Platelet   Iron, TIBC and Ferritin Panel   Vitamin B12    Magnesium   Chronic bilateral low back pain without sciatica    Persistent using topical cream which is effective    Abnormal TSH       Relevant Orders   TSH   Encounter for vitamin deficiency screening       Relevant Orders   Vitamin B12   Screening for blood or protein in urine       Relevant Orders   POCT urinalysis dipstick       No orders of the defined types were placed in this encounter.   Follow-up: Return in about 3 months (around 04/18/2021) for Follow up HTN [id number].    Vevelyn Francois, NP

## 2021-01-17 LAB — COMP. METABOLIC PANEL (12)
AST: 16 IU/L (ref 0–40)
Albumin/Globulin Ratio: 2 (ref 1.2–2.2)
Albumin: 5.3 g/dL — ABNORMAL HIGH (ref 3.8–4.9)
Alkaline Phosphatase: 102 IU/L (ref 44–121)
BUN/Creatinine Ratio: 13 (ref 12–28)
BUN: 12 mg/dL (ref 8–27)
Bilirubin Total: 0.3 mg/dL (ref 0.0–1.2)
Calcium: 10.9 mg/dL — ABNORMAL HIGH (ref 8.7–10.3)
Chloride: 98 mmol/L (ref 96–106)
Creatinine, Ser: 0.95 mg/dL (ref 0.57–1.00)
Globulin, Total: 2.7 g/dL (ref 1.5–4.5)
Glucose: 110 mg/dL — ABNORMAL HIGH (ref 65–99)
Potassium: 3.8 mmol/L (ref 3.5–5.2)
Sodium: 140 mmol/L (ref 134–144)
Total Protein: 8 g/dL (ref 6.0–8.5)
eGFR: 69 mL/min/{1.73_m2} (ref >59)

## 2021-01-17 LAB — CBC WITH DIFFERENTIAL/PLATELET
Basophils Absolute: 0 10*3/uL (ref 0.0–0.2)
Basos: 0 %
EOS (ABSOLUTE): 0 10*3/uL (ref 0.0–0.4)
Eos: 1 %
Hematocrit: 46.5 % (ref 34.0–46.6)
Hemoglobin: 15.4 g/dL (ref 11.1–15.9)
Immature Grans (Abs): 0 10*3/uL (ref 0.0–0.1)
Immature Granulocytes: 0 %
Lymphocytes Absolute: 3.4 10*3/uL — ABNORMAL HIGH (ref 0.7–3.1)
Lymphs: 49 %
MCH: 29.6 pg (ref 26.6–33.0)
MCHC: 33.1 g/dL (ref 31.5–35.7)
MCV: 89 fL (ref 79–97)
Monocytes Absolute: 0.5 10*3/uL (ref 0.1–0.9)
Monocytes: 8 %
Neutrophils Absolute: 2.8 10*3/uL (ref 1.4–7.0)
Neutrophils: 42 %
Platelets: 301 10*3/uL (ref 150–450)
RBC: 5.21 x10E6/uL (ref 3.77–5.28)
RDW: 14.5 % (ref 11.7–15.4)
WBC: 6.8 10*3/uL (ref 3.4–10.8)

## 2021-01-17 LAB — IRON,TIBC AND FERRITIN PANEL
Ferritin: 319 ng/mL — ABNORMAL HIGH (ref 15–150)
Iron Saturation: 27 % (ref 15–55)
Iron: 96 ug/dL (ref 27–159)
Total Iron Binding Capacity: 358 ug/dL (ref 250–450)
UIBC: 262 ug/dL (ref 131–425)

## 2021-01-17 LAB — SEDIMENTATION RATE: Sed Rate: 31 mm/hr (ref 0–40)

## 2021-01-17 LAB — LIPID PANEL
Chol/HDL Ratio: 6.5 ratio — ABNORMAL HIGH (ref 0.0–4.4)
Cholesterol, Total: 297 mg/dL — ABNORMAL HIGH (ref 100–199)
HDL: 46 mg/dL (ref >39)
LDL Chol Calc (NIH): 194 mg/dL — ABNORMAL HIGH (ref 0–99)
Triglycerides: 291 mg/dL — ABNORMAL HIGH (ref 0–149)
VLDL Cholesterol Cal: 57 mg/dL — ABNORMAL HIGH (ref 5–40)

## 2021-01-17 LAB — VITAMIN D 25 HYDROXY (VIT D DEFICIENCY, FRACTURES): Vit D, 25-Hydroxy: 22.6 ng/mL — ABNORMAL LOW (ref 30.0–100.0)

## 2021-01-17 LAB — TSH: TSH: 2.54 u[IU]/mL (ref 0.450–4.500)

## 2021-01-17 LAB — VITAMIN B12: Vitamin B-12: 811 pg/mL (ref 232–1245)

## 2021-01-17 LAB — CANCER ANTIGEN 27.29: CA 27.29: 48 U/mL — ABNORMAL HIGH (ref 0.0–38.6)

## 2021-01-26 ENCOUNTER — Telehealth: Payer: Self-pay | Admitting: Nurse Practitioner

## 2021-01-26 NOTE — Telephone Encounter (Signed)
-----   Message from Vevelyn Francois, NP sent at 01/25/2021  6:29 PM EDT ----- Please make Catherine [name] aware that her labs overall are stable. She does need to start a daily dose of vitamin D 1000-2000 units to help with her slightly lower than normal vitamin D level of 22 goal >30. This will help with bone health.  She would also benefit from a daily cholesterol lowering medication. I recommend Simvastatin or Rosuvastatin. Please let me know if she would like to start this.  Also make her aware that her CA 27.29 is slightly elevated. I have forwarded this to her oncologist to make him aware. They will discuss this more at her follow up visit.  Thanks

## 2021-01-26 NOTE — Telephone Encounter (Signed)
Attempted to give patient results. Advised patient provider wanted to start cholesterol medications, patient stated she did not want to be bothered nor talk to anyone and hung up.

## 2021-02-02 ENCOUNTER — Telehealth: Payer: Self-pay

## 2021-02-02 NOTE — Telephone Encounter (Signed)
Pt only wants you to call for vitamin D and cholesterol pills  She has concerns !! Doesn't want NO else to call

## 2021-02-03 NOTE — Telephone Encounter (Signed)
Catherine [name], said she missed your call yesterday,she was in the store. But could you please give her a call back please. She try to call you back and it was no answer. She sorry she missed your call. Please call her back.

## 2021-02-06 ENCOUNTER — Other Ambulatory Visit: Payer: Self-pay | Admitting: Nurse Practitioner

## 2021-02-06 MED ORDER — ROSUVASTATIN CALCIUM 5 MG PO TABS: 5.0000 mg | ORAL_TABLET | Freq: Every day | ORAL | 3 refills | Status: DC

## 2021-02-12 ENCOUNTER — Telehealth: Payer: Self-pay | Admitting: Oncology

## 2021-02-12 NOTE — Telephone Encounter (Signed)
R/s appt per 7/7 sch msg. Pt aware.  

## 2021-02-13 ENCOUNTER — Inpatient Hospital Stay: Payer: Medicaid Other

## 2021-02-16 ENCOUNTER — Other Ambulatory Visit: Payer: No Typology Code available for payment source

## 2021-02-16 ENCOUNTER — Ambulatory Visit: Payer: No Typology Code available for payment source | Admitting: Oncology

## 2021-02-19 ENCOUNTER — Other Ambulatory Visit: Payer: Self-pay

## 2021-02-19 ENCOUNTER — Inpatient Hospital Stay: Payer: Medicaid Other | Attending: Oncology

## 2021-02-19 DIAGNOSIS — Z170 Estrogen receptor positive status [ER+]: Secondary | ICD-10-CM | POA: Insufficient documentation

## 2021-02-19 DIAGNOSIS — Z923 Personal history of irradiation: Secondary | ICD-10-CM | POA: Insufficient documentation

## 2021-02-19 DIAGNOSIS — Z9012 Acquired absence of left breast and nipple: Secondary | ICD-10-CM | POA: Insufficient documentation

## 2021-02-19 DIAGNOSIS — F3160 Bipolar disorder, current episode mixed, unspecified: Secondary | ICD-10-CM

## 2021-02-19 DIAGNOSIS — Z853 Personal history of malignant neoplasm of breast: Secondary | ICD-10-CM | POA: Diagnosis not present

## 2021-02-19 DIAGNOSIS — F17200 Nicotine dependence, unspecified, uncomplicated: Secondary | ICD-10-CM

## 2021-02-19 DIAGNOSIS — C50212 Malignant neoplasm of upper-inner quadrant of left female breast: Secondary | ICD-10-CM

## 2021-02-19 LAB — CBC WITH DIFFERENTIAL/PLATELET
Abs Immature Granulocytes: 0.01 10*3/uL (ref 0.00–0.07)
Basophils Absolute: 0 10*3/uL (ref 0.0–0.1)
Basophils Relative: 0 %
Eosinophils Absolute: 0 10*3/uL (ref 0.0–0.5)
Eosinophils Relative: 0 %
HCT: 39.7 % (ref 36.0–46.0)
Hemoglobin: 13.4 g/dL (ref 12.0–15.0)
Immature Granulocytes: 0 %
Lymphocytes Relative: 41 %
Lymphs Abs: 3.1 10*3/uL (ref 0.7–4.0)
MCH: 29.8 pg (ref 26.0–34.0)
MCHC: 33.8 g/dL (ref 30.0–36.0)
MCV: 88.2 fL (ref 80.0–100.0)
Monocytes Absolute: 0.5 10*3/uL (ref 0.1–1.0)
Monocytes Relative: 7 %
Neutro Abs: 4 10*3/uL (ref 1.7–7.7)
Neutrophils Relative %: 52 %
Platelets: 257 10*3/uL (ref 150–400)
RBC: 4.5 MIL/uL (ref 3.87–5.11)
RDW: 15.1 % (ref 11.5–15.5)
WBC: 7.7 10*3/uL (ref 4.0–10.5)
nRBC: 0 % (ref 0.0–0.2)

## 2021-02-19 LAB — COMPREHENSIVE METABOLIC PANEL
ALT: 15 U/L (ref 0–44)
AST: 16 U/L (ref 15–41)
Albumin: 4 g/dL (ref 3.5–5.0)
Alkaline Phosphatase: 80 U/L (ref 38–126)
Anion gap: 10 (ref 5–15)
BUN: 15 mg/dL (ref 6–20)
CO2: 29 mmol/L (ref 22–32)
Calcium: 10.1 mg/dL (ref 8.9–10.3)
Chloride: 103 mmol/L (ref 98–111)
Creatinine, Ser: 0.94 mg/dL (ref 0.44–1.00)
GFR, Estimated: >60 mL/min (ref >60)
Glucose, Bld: 103 mg/dL — ABNORMAL HIGH (ref 70–99)
Potassium: 3.4 mmol/L — ABNORMAL LOW (ref 3.5–5.1)
Sodium: 142 mmol/L (ref 135–145)
Total Bilirubin: 0.2 mg/dL — ABNORMAL LOW (ref 0.3–1.2)
Total Protein: 7.3 g/dL (ref 6.5–8.1)

## 2021-02-19 LAB — HEPATITIS B CORE ANTIBODY, IGM: Hep B C IgM: NONREACTIVE

## 2021-02-19 LAB — HEPATITIS C ANTIBODY: HCV Ab: NONREACTIVE

## 2021-02-19 LAB — HEPATITIS B SURFACE ANTIGEN: Hepatitis B Surface Ag: NONREACTIVE

## 2021-02-23 ENCOUNTER — Telehealth: Payer: Self-pay | Admitting: *Deleted

## 2021-02-23 NOTE — Telephone Encounter (Signed)
Pt left message requesting a return call for lab results done last week.  This RN returned call and obtained verified number- this RN left message lab results normal and to call this RN if needed.

## 2021-03-05 ENCOUNTER — Other Ambulatory Visit: Payer: Self-pay

## 2021-03-05 ENCOUNTER — Telehealth: Payer: Self-pay

## 2021-03-05 DIAGNOSIS — I10 Essential (primary) hypertension: Secondary | ICD-10-CM

## 2021-03-05 MED ORDER — HYDROCHLOROTHIAZIDE 12.5 MG PO CAPS: 12.5000 mg | ORAL_CAPSULE | Freq: Every day | ORAL | 3 refills | Status: DC

## 2021-03-05 NOTE — Telephone Encounter (Signed)
Refill sent.

## 2021-03-05 NOTE — Telephone Encounter (Signed)
BP refill 90 day's supply [facility] [address]

## 2021-04-20 ENCOUNTER — Ambulatory Visit: Payer: No Typology Code available for payment source | Admitting: Nurse Practitioner

## 2021-06-05 ENCOUNTER — Ambulatory Visit: Payer: No Typology Code available for payment source | Admitting: Nurse Practitioner

## 2021-06-08 ENCOUNTER — Ambulatory Visit: Payer: No Typology Code available for payment source | Admitting: Nurse Practitioner

## 2021-06-11 ENCOUNTER — Ambulatory Visit: Payer: No Typology Code available for payment source | Admitting: Nurse Practitioner

## 2021-07-09 ENCOUNTER — Ambulatory Visit: Payer: No Typology Code available for payment source | Admitting: Nurse Practitioner

## 2021-07-16 ENCOUNTER — Ambulatory Visit: Payer: No Typology Code available for payment source | Admitting: Nurse Practitioner

## 2021-09-02 ENCOUNTER — Ambulatory Visit: Payer: No Typology Code available for payment source | Admitting: Nurse Practitioner

## 2021-09-10 ENCOUNTER — Ambulatory Visit: Payer: No Typology Code available for payment source | Admitting: Nurse Practitioner

## 2021-09-10 ENCOUNTER — Other Ambulatory Visit: Payer: Self-pay

## 2021-10-01 ENCOUNTER — Ambulatory Visit: Payer: No Typology Code available for payment source | Admitting: Nurse Practitioner

## 2021-10-16 ENCOUNTER — Ambulatory Visit: Payer: No Typology Code available for payment source | Admitting: Nurse Practitioner

## 2021-10-26 ENCOUNTER — Telehealth: Payer: Self-pay | Admitting: Clinical

## 2021-10-26 ENCOUNTER — Other Ambulatory Visit: Payer: Self-pay

## 2021-10-26 ENCOUNTER — Encounter: Payer: Self-pay | Admitting: Nurse Practitioner

## 2021-10-26 ENCOUNTER — Ambulatory Visit (INDEPENDENT_AMBULATORY_CARE_PROVIDER_SITE_OTHER): Payer: No Typology Code available for payment source | Admitting: Nurse Practitioner

## 2021-10-26 VITALS — BP 132/98 | HR 67 | Temp 96.4°F | Ht 66.5 in | Wt 145.6 lb

## 2021-10-26 DIAGNOSIS — G8929 Other chronic pain: Secondary | ICD-10-CM

## 2021-10-26 DIAGNOSIS — M25562 Pain in left knee: Secondary | ICD-10-CM

## 2021-10-26 DIAGNOSIS — F3160 Bipolar disorder, current episode mixed, unspecified: Secondary | ICD-10-CM

## 2021-10-26 DIAGNOSIS — I10 Essential (primary) hypertension: Secondary | ICD-10-CM

## 2021-10-26 DIAGNOSIS — M1990 Unspecified osteoarthritis, unspecified site: Secondary | ICD-10-CM

## 2021-10-26 DIAGNOSIS — E782 Mixed hyperlipidemia: Secondary | ICD-10-CM

## 2021-10-26 DIAGNOSIS — R630 Anorexia: Secondary | ICD-10-CM

## 2021-10-26 LAB — POCT URINALYSIS DIP (CLINITEK)
Bilirubin, UA: NEGATIVE
Glucose, UA: NEGATIVE mg/dL
Ketones, POC UA: NEGATIVE mg/dL
Leukocytes, UA: NEGATIVE
Nitrite, UA: NEGATIVE
POC PROTEIN,UA: NEGATIVE
Spec Grav, UA: >=1.03 — AB (ref 1.010–1.025)
Urobilinogen, UA: 0.2 E.U./dL
pH, UA: 5.5 (ref 5.0–8.0)

## 2021-10-26 MED ORDER — ENLIVE PO LIQD: 240.0000 mL | Freq: Every day | ORAL | 5 refills | Status: AC

## 2021-10-26 MED ORDER — MIRTAZAPINE 7.5 MG PO TABS: 7.5000 mg | ORAL_TABLET | Freq: Every day | ORAL | 3 refills | Status: DC

## 2021-10-26 NOTE — Progress Notes (Signed)
[facility] Patient [facility] [address] [place], [place]  [address] Phone:  [phone]   Fax:  [phone]   Established Patient Office Visit  Subjective:  Patient ID: CHANEA [name], female    DOB: [birth year]  Age: 61 y.o. MRN: [id number]  CC:  Chief Complaint  Patient presents with   Follow-up    Patient is here today for her follow up and and still having issues in her left knee. Patient states that it has been bothering more then her last visit. Patient also states that she has been dealing with some depression and anxiety and was previously on Depakote. Patient states that she doesn't want to do therapy. Patient also states that she would like to know if there are any liquid vitamins because she is loosing weight.    HPI Catherine [name] presents for follow up. She  has a past medical history of Anxiety, Breast cancer (HCC), Breast cancer of upper-inner quadrant of left female breast (HCC) (06/09/2015), Depression, and Hypertension.   She is in for a general follow up. She dos have multiple concerns. She does not like to take a lot of medication. She has a history of cancer and has anxiety as it relates to the potential of recurrence. She has racing thoughts. She has always had sleepless nights. She has a decreased appetite which maybe stress related. She is concern about her current weight. She goal weight is 153. She has had 3 pound weight gain since her previous weight. She is requesting assistance with Enlive supplements. She feels like this will be effective to cover the meals she misses.   She has HTN and is not compliant with her current regimen of HCTZ 12.5 mg. She endorses having the medication insight however forgets to take or cannot bring herself to take it regularly.   She has arthritis in her toes and feet. She does have flare ups and uses Diclofenac and tizanidine sparingly.   Past Medical History:  Diagnosis Date   Anxiety    pt very anxious on phone    Breast cancer [facility])    Breast cancer of upper-inner quadrant of left female breast (HCC) 06/09/2015   Depression    Hypertension     Past Surgical History:  Procedure Laterality Date   BREAST SURGERY     EVACUATION BREAST HEMATOMA Left 07/02/2015   Procedure: EVACUATION HEMATOMA LEFT Mastectomy Site;  Surgeon: Jimmye Norman, MD;  Location: [facility] OR;  Service: General;  Laterality: Left;   MASTECTOMY W/ SENTINEL NODE BIOPSY Left 07/01/2015   Procedure: LEFT MASTECTOMY WITH SENTINEL LYMPH NODE BIOPSY;  Surgeon: Almond Lint, MD;  Location: [facility] OR;  Service: General;  Laterality: Left;   PORT-A-CATH REMOVAL Right 09/04/2015   Procedure: REMOVAL PORT-A-CATH;  Surgeon: Almond Lint, MD;  Location: [facility];  Service: General;  Laterality: Right;   PORTACATH PLACEMENT N/A 07/01/2015   Procedure: INSERTION PORT-A-CATH;  Surgeon: Almond Lint, MD;  Location: [facility] OR;  Service: General;  Laterality: N/A;    Family History  Problem Relation Age of Onset   Hypertension Mother    Hypertension Father    Hypertension Sister    Hypertension Brother     Social History   Socioeconomic History   Marital status: Single    Spouse name: Not on file   Number of children: Not on file   Years of education: Not on file   Highest education level: Not on file  Occupational History  Not on file  Tobacco Use   Smoking status: Every Day    Packs/day: 1.00    Years: 30.00    Pack years: 30.00    Types: Cigarettes   Smokeless tobacco: Never  Vaping Use   Vaping Use: Never used  Substance and Sexual Activity   Alcohol use: No    Alcohol/week: 1.0 standard drink    Types: 1 Glasses of wine per week    Comment: daily   Drug use: No   Sexual activity: Not Currently  Other Topics Concern   Not on file  Social History Narrative   Not on file   Social Determinants of Health   Financial Resource Strain: Not on file  Food Insecurity: Not on file  Transportation  Needs: Not on file  Physical Activity: Not on file  Stress: Not on file  Social Connections: Not on file  Intimate Partner Violence: Not on file    Outpatient Medications Prior to Visit  Medication Sig Dispense Refill   hydrochlorothiazide (MICROZIDE) 12.5 MG capsule Take 1 capsule (12.5 mg total) by mouth daily. 90 capsule 3   rosuvastatin (CRESTOR) 5 MG tablet Take 1 tablet (5 mg total) by mouth daily. 90 tablet 3   diclofenac Sodium (VOLTAREN) 1 % GEL Apply 4 g topically 4 (four) times daily. (Patient not taking: Reported on 10/26/2021) 350 g 0   ibuprofen (ADVIL) 800 MG tablet Take 1 tablet (800 mg total) by mouth 3 (three) times daily. (Patient not taking: Reported on 01/06/2021) 21 tablet 0   tiZANidine (ZANAFLEX) 2 MG tablet Take 1-2 tablets (2-4 mg total) by mouth at bedtime. (Patient not taking: Reported on 01/06/2021) 16 tablet 0   No facility-administered medications prior to visit.    Allergies  Allergen Reactions   Codeine Nausea And Vomiting   Metronidazole Nausea And Vomiting    ROS Review of Systems    Objective:    Physical Exam Constitutional:      Appearance: She is normal weight.  HENT:     Head: Normocephalic and atraumatic.     Nose: Nose normal.     Mouth/Throat:     Mouth: Mucous membranes are moist.  Cardiovascular:     Rate and Rhythm: Normal rate and regular rhythm.     Pulses: Normal pulses.     Heart sounds: Normal heart sounds.  Pulmonary:     Effort: Pulmonary effort is normal.     Breath sounds: Normal breath sounds.  Abdominal:     General: Bowel sounds are normal.     Palpations: Abdomen is soft.  Musculoskeletal:        General: Normal range of motion.     Cervical back: Normal range of motion.     Right lower leg: No edema.     Left lower leg: No edema.  Skin:    General: Skin is warm and dry.     Capillary Refill: Capillary refill takes less than 2 seconds.  Neurological:     General: No focal deficit present.      Mental Status: She is alert and oriented to person, place, and time.  Psychiatric:        Mood and Affect: Mood normal.        Behavior: Behavior normal.        Thought Content: Thought content normal.        Judgment: Judgment normal.    BP (!) 132/98   Pulse 67   Temp (!) 96.4 F (35.8  C)   Ht 5' 6.5" (1.689 m)   Wt 145 lb 9.6 oz (66 kg)   LMP 06/30/2013   SpO2 99%   BMI 23.15 kg/m  Wt Readings from Last 3 Encounters:  10/26/21 145 lb 9.6 oz (66 kg)  01/16/21 142 lb (64.4 kg)  07/18/20 157 lb (71.2 kg)     Health Maintenance Due  Topic Date Due   Zoster Vaccines- Shingrix (1 of 2) Never done   COLONOSCOPY (Pts 45-84yrs Insurance coverage will need to be confirmed)  Never done   TETANUS/TDAP  08/10/2007   PAP SMEAR-Modifier  06/19/2019   COVID-19 Vaccine (4 - Booster) 05/22/2020    There are no preventive care reminders to display for this patient.  Lab Results  Component Value Date   TSH 2.540 01/16/2021   Lab Results  Component Value Date   WBC 7.7 02/19/2021   HGB 13.4 02/19/2021   HCT 39.7 02/19/2021   MCV 88.2 02/19/2021   PLT 257 02/19/2021   Lab Results  Component Value Date   NA 142 02/19/2021   K 3.4 (L) 02/19/2021   CHLORIDE 107 08/10/2017   CO2 29 02/19/2021   GLUCOSE 103 (H) 02/19/2021   BUN 15 02/19/2021   CREATININE 0.94 02/19/2021   BILITOT 0.2 (L) 02/19/2021   ALKPHOS 80 02/19/2021   AST 16 02/19/2021   ALT 15 02/19/2021   PROT 7.3 02/19/2021   ALBUMIN 4.0 02/19/2021   CALCIUM 10.1 02/19/2021   ANIONGAP 10 02/19/2021   EGFR 69 01/16/2021   Lab Results  Component Value Date   CHOL 297 (H) 01/16/2021   Lab Results  Component Value Date   HDL 46 01/16/2021   Lab Results  Component Value Date   LDLCALC 194 (H) 01/16/2021   Lab Results  Component Value Date   TRIG 291 (H) 01/16/2021   Lab Results  Component Value Date   CHOLHDL 6.5 (H) 01/16/2021   No results found for: HGBA1C    Assessment & Plan:   Problem  List Items Addressed This Visit       Other   Mixed bipolar I disorder (HCC) Discussed Medicines that relieve depression Discussed counseling (with a psychiatrist, psychologist, or social worker). She has declined     Relevant Medications   mirtazapine (REMERON) 7.5 MG tablet   Other Visit Diagnoses     Chronic pain of left knee     Relevant Medications   mirtazapine (REMERON) 7.5 MG tablet   Arthritis     Persistent but stable with flare ups    Essential hypertension     Persistent  Encouraged on going compliance with current medication regimen Encouraged home monitoring and recording BP <130/80 Eating a heart-healthy diet with less salt Encouraged regular physical activity  Recommend Weight loss     Relevant Orders   POCT URINALYSIS DIP (CLINITEK) (Completed)   Mixed hyperlipidemia     Stable  Encouraged on going compliance with current medication regimen. I recommend eating a heart-healthy diet; low in fat and cholesterol along with regular exercise.    Decreased appetite     Trial Mirtazapine  CSW to notify patient with samples.   Relevant Medications   mirtazapine (REMERON) 7.5 MG tablet   Nutritional Supplements (ENLIVE) LIQD       Meds ordered this encounter  Medications   mirtazapine (REMERON) 7.5 MG tablet    Sig: Take 1 tablet (7.5 mg total) by mouth at bedtime.    Dispense:  90 tablet  Refill:  3    Order Specific Question:   Supervising Provider    Answer:   Quentin Angst [id number]   Nutritional Supplements (ENLIVE) LIQD    Sig: Take 240 mLs by mouth daily.    Dispense:  7200 mL    Refill:  5    Order Specific Question:   Supervising Provider    Answer:   Quentin Angst [id number]    Follow-up: Return in about 4 weeks (around 11/23/2021) for follow up medication management 40102 appetite , follow up medication management [id number].    Barbette Merino, NP

## 2021-10-26 NOTE — Telephone Encounter (Signed)
[facility] ?Case Management Referral Note ? ?10/26/2021 ?Name: RAEVIN WIERENGA MRN: [id number] DOB: 1960/09/18 ?JAZMIN LEY is a 61 y.o. year old female who sees Vevelyn Francois, NP for primary care. LCSW was consulted to assess patient's needs and assist the patient with [employer] . ? ?Interpreter: No.   Interpreter Name & Language: none ? ?Assessment: Patient experiencing financial difficulty related to low income. She would like Ensure Enlive supplements. ? ?Intervention: Patient would like Enlive supplements but is unable to afford the high cost. CSW called patient to follow up. Discussed [employer] patient assistance application. Patient reported her provider discussed a particular vitamin supplement with her at appointment today and she picked it up at the pharmacy. She would like to see how that works before applying for Ensure assistance.  ? ?SDOH (Social Determinants of Health) assessments performed: No ? ?Review of patient status, including review of consultants reports, relevant laboratory and other test results, and collaboration with appropriate care team members and the patient's provider was performed as part of comprehensive patient evaluation and provision of services.   ? ?Estanislado Emms, LCSW ?[facility] ?[place] ?[phone] ?   ?

## 2021-10-26 NOTE — Patient Instructions (Signed)
Healthy Eating ?Following a healthy eating pattern may help you to achieve and maintain a healthy body weight, reduce the risk of chronic disease, and live a long and productive life. It is important to follow a healthy eating pattern at an appropriate calorie level for your body. Your nutritional needs should be met primarily through food by choosing a variety of nutrient-rich foods. ?What are tips for following this plan? ?Reading food labels ?Read labels and choose the following: ?Reduced or low sodium. ?Juices with 100% fruit juice. ?Foods with low saturated fats and high polyunsaturated and monounsaturated fats. ?Foods with whole grains, such as whole wheat, cracked wheat, brown rice, and wild rice. ?Whole grains that are fortified with folic acid. This is recommended for women who are pregnant or who want to become pregnant. ?Read labels and avoid the following: ?Foods with a lot of added sugars. These include foods that contain brown sugar, corn sweetener, corn syrup, dextrose, fructose, glucose, high-fructose corn syrup, honey, invert sugar, lactose, malt syrup, maltose, molasses, raw sugar, sucrose, trehalose, or turbinado sugar. ?Do not eat more than the following amounts of added sugar per day: ?6 teaspoons (25 g) for women. ?9 teaspoons (38 g) for men. ?Foods that contain processed or refined starches and grains. ?Refined grain products, such as white flour, degermed cornmeal, white bread, and white rice. ?Shopping ?Choose nutrient-rich snacks, such as vegetables, whole fruits, and nuts. Avoid high-calorie and high-sugar snacks, such as potato chips, fruit snacks, and candy. ?Use oil-based dressings and spreads on foods instead of solid fats such as butter, stick margarine, or cream cheese. ?Limit pre-made sauces, mixes, and "instant" products such as flavored rice, instant noodles, and ready-made pasta. ?Try more plant-protein sources, such as tofu, tempeh, black beans, edamame, lentils, nuts, and  seeds. ?Explore eating plans such as the Mediterranean diet or vegetarian diet. ?Cooking ?Use oil to saut? or stir-fry foods instead of solid fats such as butter, stick margarine, or lard. ?Try baking, boiling, grilling, or broiling instead of frying. ?Remove the fatty part of meats before cooking. ?Steam vegetables in water or broth. ?Meal planning ? ?At meals, imagine dividing your plate into fourths: ?One-half of your plate is fruits and vegetables. ?One-fourth of your plate is whole grains. ?One-fourth of your plate is protein, especially lean meats, poultry, eggs, tofu, beans, or nuts. ?Include low-fat dairy as part of your daily diet. ?Lifestyle ?Choose healthy options in all settings, including home, work, school, restaurants, or stores. ?Prepare your food safely: ?Wash your hands after handling raw meats. ?Keep food preparation surfaces clean by regularly washing with hot, soapy water. ?Keep raw meats separate from ready-to-eat foods, such as fruits and vegetables. ?Cook seafood, meat, poultry, and eggs to the recommended internal temperature. ?Store foods at safe temperatures. In general: ?Keep cold foods at 40?F (4.4?C) or below. ?Keep hot foods at 140?F (60?C) or above. ?Keep your freezer at 0?F (-17.8?C) or below. ?Foods are no longer safe to eat when they have been between the temperatures of 40?-140?F (4.4-60?C) for more than 2 hours. ?What foods should I eat? ?Fruits ?Aim to eat 2 cup-equivalents of fresh, canned (in natural juice), or frozen fruits each day. Examples of 1 cup-equivalent of fruit include 1 small apple, 8 large strawberries, 1 cup canned fruit, ? cup dried fruit, or 1 cup 100% juice. ?Vegetables ?Aim to eat 2?-3 cup-equivalents of fresh and frozen vegetables each day, including different varieties and colors. Examples of 1 cup-equivalent of vegetables include 2 medium carrots, 2 cups raw,  leafy greens, 1 cup chopped vegetable (raw or cooked), or 1 medium baked potato. ?Grains ?Aim to  eat 6 ounce-equivalents of whole grains each day. Examples of 1 ounce-equivalent of grains include 1 slice of bread, 1 cup ready-to-eat cereal, 3 cups popcorn, or ? cup cooked rice, pasta, or cereal. ?Meats and other proteins ?Aim to eat 5-6 ounce-equivalents of protein each day. Examples of 1 ounce-equivalent of protein include 1 egg, 1/2 cup nuts or seeds, or 1 tablespoon (16 g) peanut butter. A cut of meat or fish that is the size of a deck of cards is about 3-4 ounce-equivalents. ?Of the protein you eat each week, try to have at least 8 ounces come from seafood. This includes salmon, trout, herring, and anchovies. ?Dairy ?Aim to eat 3 cup-equivalents of fat-free or low-fat dairy each day. Examples of 1 cup-equivalent of dairy include 1 cup (240 mL) milk, 8 ounces (250 g) yogurt, 1? ounces (44 g) natural cheese, or 1 cup (240 mL) fortified soy milk. ?Fats and oils ?Aim for about 5 teaspoons (21 g) per day. Choose monounsaturated fats, such as canola and olive oils, avocados, peanut butter, and most nuts, or polyunsaturated fats, such as sunflower, corn, and soybean oils, walnuts, pine nuts, sesame seeds, sunflower seeds, and flaxseed. ?Beverages ?Aim for six 8-oz glasses of water per day. Limit coffee to three to five 8-oz cups per day. ?Limit caffeinated beverages that have added calories, such as soda and energy drinks. ?Limit alcohol intake to no more than 1 drink a day for nonpregnant women and 2 drinks a day for men. One drink equals 12 oz of beer (355 mL), 5 oz of wine (148 mL), or 1? oz of hard liquor (44 mL). ?Seasoning and other foods ?Avoid adding excess amounts of salt to your foods. Try flavoring foods with herbs and spices instead of salt. ?Avoid adding sugar to foods. ?Try using oil-based dressings, sauces, and spreads instead of solid fats. ?This information is based on general [place] nutrition guidelines. For more information, visit [employer]. Exact amounts may vary based on your nutrition  needs. ?Summary ?A healthy eating plan may help you to maintain a healthy weight, reduce the risk of chronic diseases, and stay active throughout your life. ?Plan your meals. Make sure you eat the right portions of a variety of nutrient-rich foods. ?Try baking, boiling, grilling, or broiling instead of frying. ?Choose healthy options in all settings, including home, work, school, restaurants, or stores. ?This information is not intended to replace advice given to you by your health care provider. Make sure you discuss any questions you have with your health care provider. ?Document Revised: 03/24/2021 Document Reviewed: 03/24/2021 ?Elsevier Patient Education ? [place]. ? ?

## 2021-10-28 ENCOUNTER — Other Ambulatory Visit (HOSPITAL_COMMUNITY): Payer: Self-pay

## 2022-04-20 ENCOUNTER — Other Ambulatory Visit: Payer: Self-pay | Admitting: Nurse Practitioner

## 2022-04-20 DIAGNOSIS — I10 Essential (primary) hypertension: Secondary | ICD-10-CM

## 2022-07-05 ENCOUNTER — Ambulatory Visit: Payer: Self-pay | Admitting: Nurse Practitioner

## 2022-07-20 ENCOUNTER — Ambulatory Visit: Payer: Self-pay | Admitting: Nurse Practitioner

## 2022-08-17 ENCOUNTER — Ambulatory Visit: Payer: Self-pay | Admitting: Nurse Practitioner

## 2022-08-31 ENCOUNTER — Ambulatory Visit: Payer: Self-pay | Admitting: Nurse Practitioner

## 2022-09-07 ENCOUNTER — Ambulatory Visit: Payer: Self-pay | Admitting: Nurse Practitioner

## 2022-09-22 ENCOUNTER — Ambulatory Visit: Payer: Self-pay | Admitting: Nurse Practitioner

## 2022-10-05 ENCOUNTER — Encounter: Payer: Self-pay | Admitting: Nurse Practitioner

## 2022-10-05 ENCOUNTER — Ambulatory Visit (INDEPENDENT_AMBULATORY_CARE_PROVIDER_SITE_OTHER): Payer: No Typology Code available for payment source | Admitting: Nurse Practitioner

## 2022-10-05 VITALS — BP 135/85 | HR 69 | Temp 97.3°F | Ht 66.5 in | Wt 143.8 lb

## 2022-10-05 DIAGNOSIS — Z716 Tobacco abuse counseling: Secondary | ICD-10-CM

## 2022-10-05 DIAGNOSIS — F3160 Bipolar disorder, current episode mixed, unspecified: Secondary | ICD-10-CM

## 2022-10-05 DIAGNOSIS — C50212 Malignant neoplasm of upper-inner quadrant of left female breast: Secondary | ICD-10-CM

## 2022-10-05 DIAGNOSIS — Z170 Estrogen receptor positive status [ER+]: Secondary | ICD-10-CM

## 2022-10-05 DIAGNOSIS — I10 Essential (primary) hypertension: Secondary | ICD-10-CM

## 2022-10-05 DIAGNOSIS — Z131 Encounter for screening for diabetes mellitus: Secondary | ICD-10-CM

## 2022-10-05 DIAGNOSIS — Z1211 Encounter for screening for malignant neoplasm of colon: Secondary | ICD-10-CM

## 2022-10-05 DIAGNOSIS — E782 Mixed hyperlipidemia: Secondary | ICD-10-CM

## 2022-10-05 NOTE — Assessment & Plan Note (Signed)
[facility] Office Visit from 10/05/2022 in [place]  PHQ-9 Total Score 21     Currently not on medication She declined any fever to psychiatrist She denies SI, HI

## 2022-10-05 NOTE — Assessment & Plan Note (Signed)
She has lost follow-up with oncology She declines any complaints today Stated that she would like oncology to contact her to schedule an appointment for follow-up, I will put in a referral today

## 2022-10-05 NOTE — Patient Instructions (Signed)
It is important that you exercise regularly at least 30 minutes 5 times a week as tolerated  Think about what you will eat, plan ahead. Choose " clean, green, fresh or frozen" over canned, processed or packaged foods which are more sugary, salty and fatty. 70 to 75% of food eaten should be vegetables and fruit. Three meals at set times with snacks allowed between meals, but they must be fruit or vegetables. Aim to eat over a 12 hour period , example 7 am to 7 pm, and STOP after  your last meal of the day. Drink water,generally about 64 ounces per day, no other drink is as healthy. Fruit juice is best enjoyed in a healthy way, by EATING the fruit.  Thanks for choosing [name] we consider it a privelige to serve you.

## 2022-10-05 NOTE — Assessment & Plan Note (Signed)
She has Crestor ordered but she has not been taking the medication Fasting lipid panel ordered today I discussed the need to be on a statin to help decrease her risk of MI, stroke but she stated that she does not like taking medications The 10-year ASCVD risk score (Arnett DK, et al., 2019) is: 24.3%   Values used to calculate the score:     Age: 62 years     Sex: Female     Is Non-Hispanic African American: Yes     Diabetic: No     Tobacco smoker: Yes     Systolic Blood Pressure: [id number] mmHg     Is BP treated: Yes     HDL Cholesterol: 46 mg/dL     Total Cholesterol: 297 mg/dL
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
## 2022-10-05 NOTE — Assessment & Plan Note (Signed)
Smokes about 1 pack/day  Asked about quitting: confirms that he/she currently smokes cigarettes Advise to quit smoking: Educated about QUITTING to reduce the risk of cancer, cardio and cerebrovascular disease. Assess willingness: Unwilling to quit at this time, not working on cutting back. Assist with counseling and pharmacotherapy: Counseled for 5 minutes and literature provided. Arrange for follow up: follow up in 4 months and continue to offer help.

## 2022-10-05 NOTE — Assessment & Plan Note (Signed)
BP Readings from Last 3 Encounters:  10/05/22 135/85  10/26/21 (!) 132/98  01/16/21 (!) 132/99  Currently on hydrochlorothiazide 12.5 mg daily Continue current medication DASH diet advised engage in regular moderate exercises at least 150 minutes weekly Checking CMP today Follow-up in 4 months

## 2022-10-06 ENCOUNTER — Other Ambulatory Visit: Payer: Self-pay | Admitting: Nurse Practitioner

## 2022-10-06 DIAGNOSIS — E782 Mixed hyperlipidemia: Secondary | ICD-10-CM

## 2022-10-06 LAB — CBC WITH DIFFERENTIAL/PLATELET
Basophils Absolute: 0 10*3/uL (ref 0.0–0.2)
Basos: 0 %
EOS (ABSOLUTE): 0 10*3/uL (ref 0.0–0.4)
Eos: 0 %
Hematocrit: 42.2 % (ref 34.0–46.6)
Hemoglobin: 14.1 g/dL (ref 11.1–15.9)
Immature Grans (Abs): 0 10*3/uL (ref 0.0–0.1)
Immature Granulocytes: 0 %
Lymphocytes Absolute: 3.7 10*3/uL — ABNORMAL HIGH (ref 0.7–3.1)
Lymphs: 53 %
MCH: 29.6 pg (ref 26.6–33.0)
MCHC: 33.4 g/dL (ref 31.5–35.7)
MCV: 89 fL (ref 79–97)
Monocytes Absolute: 0.5 10*3/uL (ref 0.1–0.9)
Monocytes: 7 %
Neutrophils Absolute: 2.9 10*3/uL (ref 1.4–7.0)
Neutrophils: 40 %
Platelets: 255 10*3/uL (ref 150–450)
RBC: 4.77 x10E6/uL (ref 3.77–5.28)
RDW: 14.2 % (ref 11.7–15.4)
WBC: 7.3 10*3/uL (ref 3.4–10.8)

## 2022-10-06 LAB — LIPID PANEL
Chol/HDL Ratio: 4.8 ratio — ABNORMAL HIGH (ref 0.0–4.4)
Cholesterol, Total: 250 mg/dL — ABNORMAL HIGH (ref 100–199)
HDL: 52 mg/dL (ref >39)
LDL Chol Calc (NIH): 165 mg/dL — ABNORMAL HIGH (ref 0–99)
Triglycerides: 182 mg/dL — ABNORMAL HIGH (ref 0–149)
VLDL Cholesterol Cal: 33 mg/dL (ref 5–40)

## 2022-10-06 LAB — CMP14+EGFR
ALT: 20 IU/L (ref 0–32)
AST: 23 IU/L (ref 0–40)
Albumin/Globulin Ratio: 1.9 (ref 1.2–2.2)
Albumin: 4.8 g/dL (ref 3.9–4.9)
Alkaline Phosphatase: 99 IU/L (ref 44–121)
BUN/Creatinine Ratio: 9 — ABNORMAL LOW (ref 12–28)
BUN: 9 mg/dL (ref 8–27)
Bilirubin Total: 0.3 mg/dL (ref 0.0–1.2)
CO2: 25 mmol/L (ref 20–29)
Calcium: 10.7 mg/dL — ABNORMAL HIGH (ref 8.7–10.3)
Chloride: 101 mmol/L (ref 96–106)
Creatinine, Ser: 1 mg/dL (ref 0.57–1.00)
Globulin, Total: 2.5 g/dL (ref 1.5–4.5)
Glucose: 94 mg/dL (ref 70–99)
Potassium: 4 mmol/L (ref 3.5–5.2)
Sodium: 140 mmol/L (ref 134–144)
Total Protein: 7.3 g/dL (ref 6.0–8.5)
eGFR: 64 mL/min/{1.73_m2} (ref >59)

## 2022-10-06 LAB — HEMOGLOBIN A1C
Est. average glucose Bld gHb Est-mCnc: 128 mg/dL
Hgb A1c MFr Bld: 6.1 % — ABNORMAL HIGH (ref 4.8–5.6)

## 2022-10-06 MED ORDER — ROSUVASTATIN CALCIUM 5 MG PO TABS: 5.0000 mg | ORAL_TABLET | Freq: Every day | ORAL | 1 refills | Status: DC

## 2022-10-06 NOTE — Progress Notes (Signed)
Prediabetes avoid sugar sweets soda  Your cholesterol level is still high, to help prevent risk of heart attack, stroke I would encourage you to take your Crestor 5 mg daily, avoid fatty fried foods  Calcium level is high please avoid taking calcium supplements.  We will recheck your labs at your next visit  Nurse please schedule an appointment in 2 months to recheck lipid panel and calcium level

## 2022-10-11 ENCOUNTER — Telehealth: Payer: Self-pay | Admitting: Adult Health

## 2022-10-11 NOTE — Telephone Encounter (Signed)
Per 3/4 IB, called but both numbers listed not available, will send calendar

## 2022-10-21 ENCOUNTER — Inpatient Hospital Stay: Payer: Medicaid Other | Attending: Adult Health | Admitting: Adult Health

## 2022-10-21 NOTE — Progress Notes (Deleted)
[facility] Follow up:    Catherine Rival, FNP [address] [place], [place] [address]   DIAGNOSIS: Cancer Staging  Malignant neoplasm of upper-inner quadrant of left breast in female, estrogen receptor positive ([place]) Staging form: Breast, AJCC 7th Edition - Clinical: Stage IIB (T2, N1, M0) - Signed by Chauncey Cruel, MD on 06/11/2015   SUMMARY OF ONCOLOGIC HISTORY: [place] woman status post left breast upper inner quadrant biopsy 06/05/2015 for a clinical T2 NX, stage II or III invasive ductal carcinoma, estrogen and progesterone receptor positive, with an MIB-1 of 20%, and equivocal HER-2 amplification             (a) the patient refused biopsy of a suspicious left axillary lymph node             (b) bone scan 02/11/2016 showed no evidence of bony metastases.   (1) status post left mastectomy and sentinel lymph node sampling 07/01/2015 for a pT3 pN0, stage IIB invasive ductal carcinoma, grade 2, repeat estrogen and progesterone receptor positive, HER-2 not amplified by FISH, with an MIB-1 of 30%.             (a) a total of 4 left sentinel lymph nodes removed   (2) Oncotype DX score of 16 predicts a risk of recurrence outside the breast within 10 years of 10% if the patient's only systemic therapy is tamoxifen for 5 years. It also predicts no significant benefit from chemotherapy.   (3) adjuvant radiation 10/06/15-11/14/15  Site/dose:    Left chest wall / 50.4 Gray @ 1.8 Gray per fraction x 28 fractions    (4) tamoxifen taken from 11/07/15 to 12/17/15. Discontinued because of vaginal irritation and itchiness.   (5) anastrozole prescribed 01/07/16, never started by patient    (5) bipolar disorder: The patient refuses medication at this time   (6) tobacco abuse disorder: Discussed in detail  CURRENT THERAPY:  INTERVAL HISTORY: Catherine [name] 62 y.o. female returns for    Patient Active Problem List   Diagnosis Date Noted   Essential  hypertension 10/05/2022   Tobacco abuse counseling 10/05/2022   Tobacco dependence 10/29/2016   Need for hepatitis C screening test 10/29/2016   Malignant neoplasm of upper-inner quadrant of left breast in female, estrogen receptor positive ([place]) 06/09/2015   BREAST PAIN 05/22/2008   POPLITEAL CYST, LEFT 05/22/2008   Mixed bipolar I disorder ([place]) 03/09/2007   TOBACCO ABUSE 03/09/2007   ELEVATED BLOOD PRESSURE WITHOUT DIAGNOSIS OF HYPERTENSION 03/09/2007   Mixed hyperlipidemia 12/19/2006   Internal derangement of knee 10/06/2006    is allergic to codeine and metronidazole.  MEDICAL HISTORY: Past Medical History:  Diagnosis Date   Anxiety    pt very anxious on phone   Breast cancer [facility])    Breast cancer of upper-inner quadrant of left female breast ([place]) 06/09/2015   Depression    Hypertension     SURGICAL HISTORY: Past Surgical History:  Procedure Laterality Date   BREAST SURGERY     EVACUATION BREAST HEMATOMA Left 07/02/2015   Procedure: EVACUATION HEMATOMA LEFT Mastectomy Site;  Surgeon: Judeth Horn, MD;  Location: [place];  Service: General;  Laterality: Left;   MASTECTOMY W/ SENTINEL NODE BIOPSY Left 07/01/2015   Procedure: LEFT MASTECTOMY WITH SENTINEL LYMPH NODE BIOPSY;  Surgeon: [name], MD;  Location: [place];  Service: General;  Laterality: Left;   PORT-A-CATH REMOVAL Right 09/04/2015   Procedure: REMOVAL PORT-A-CATH;  Surgeon: [name], MD;  Location: [place];  Service: General;  Laterality: Right;   PORTACATH PLACEMENT N/A 07/01/2015   Procedure: INSERTION PORT-A-CATH;  Surgeon: [name], MD;  [facility];  Service: General;  Laterality: N/A;    SOCIAL HISTORY: Social History   Socioeconomic History   Marital status: Single    Spouse name: Not on file   Number of children: 2   Years of education: Not on file   Highest education level: Not on file  Occupational History   Not on file  Tobacco Use   Smoking status: Every Day     Packs/day: 1.00    Years: 30.00    Additional pack years: 0.00    Total pack years: 30.00    Types: Cigarettes   Smokeless tobacco: Never  Vaping Use   Vaping Use: Never used  Substance and Sexual Activity   Alcohol use: No    Alcohol/week: 1.0 standard drink of alcohol    Types: 1 Glasses of wine per week    Comment: daily   Drug use: No   Sexual activity: Not Currently  Other Topics Concern   Not on file  Social History Narrative   Lives with his younger son.    Social Determinants of Health   Financial Resource Strain: Not on file  Food Insecurity: Not on file  Transportation Needs: Not on file  Physical Activity: Not on file  Stress: Not on file  Social Connections: Not on file  Intimate Partner Violence: Not on file    FAMILY HISTORY: Family History  Problem Relation Age of Onset   Hypertension Mother    Hypertension Father    Hypertension Sister    Hypertension Brother     Review of Systems - Oncology    PHYSICAL EXAMINATION  ECOG PERFORMANCE STATUS: {CHL ONC ECOG [id number]  There were no vitals filed for this visit.  Physical Exam  LABORATORY DATA:  CBC    Component Value Date/Time   WBC 7.3 10/05/2022 0852   WBC 7.7 02/19/2021 0747   RBC 4.77 10/05/2022 0852   RBC 4.50 02/19/2021 0747   HGB 14.1 10/05/2022 0852   HGB 13.7 08/10/2017 0846   HCT 42.2 10/05/2022 0852   HCT 41.8 08/10/2017 0846   PLT 255 10/05/2022 0852   MCV 89 10/05/2022 0852   MCV 89.3 08/10/2017 0846   MCH 29.6 10/05/2022 0852   MCH 29.8 02/19/2021 0747   MCHC 33.4 10/05/2022 0852   MCHC 33.8 02/19/2021 0747   RDW 14.2 10/05/2022 0852   RDW 15.8 (H) 08/10/2017 0846   LYMPHSABS 3.7 (H) 10/05/2022 0852   LYMPHSABS 2.6 08/10/2017 0846   MONOABS 0.5 02/19/2021 0747   MONOABS 0.6 08/10/2017 0846   EOSABS 0.0 10/05/2022 0852   BASOSABS 0.0 10/05/2022 0852   BASOSABS 0.0 08/10/2017 0846    CMP     Component Value Date/Time   NA 140 10/05/2022 0852   NA  142 08/10/2017 0846   K 4.0 10/05/2022 0852   K 4.3 08/10/2017 0846   CL 101 10/05/2022 0852   CO2 25 10/05/2022 0852   CO2 27 08/10/2017 0846   GLUCOSE 94 10/05/2022 0852   GLUCOSE 103 (H) 02/19/2021 0747   GLUCOSE 88 08/10/2017 0846   BUN 9 10/05/2022 0852   BUN 12.0 08/10/2017 0846   CREATININE 1.00 10/05/2022 0852   CREATININE 0.84 08/14/2019 0846   CREATININE 0.9 08/10/2017 0846   CALCIUM 10.7 (H) 10/05/2022 0852   CALCIUM 10.2 08/10/2017 0846  PROT 7.3 10/05/2022 0852   PROT 7.5 08/10/2017 0846   ALBUMIN 4.8 10/05/2022 0852   ALBUMIN 4.3 08/10/2017 0846   AST 23 10/05/2022 0852   AST 33 08/14/2019 0846   AST 15 08/10/2017 0846   ALT 20 10/05/2022 0852   ALT 52 (H) 08/14/2019 0846   ALT 19 08/10/2017 0846   ALKPHOS 99 10/05/2022 0852   ALKPHOS 122 08/10/2017 0846   BILITOT 0.3 10/05/2022 0852   BILITOT 0.4 08/14/2019 0846   BILITOT 0.22 08/10/2017 0846   GFRNONAA >60 02/19/2021 0747   GFRNONAA >60 08/14/2019 0846   GFRNONAA 70 03/25/2017 1015   GFRAA 72 01/18/2020 0911   GFRAA >60 08/14/2019 0846   GFRAA 80 03/25/2017 1015       PENDING LABS:   RADIOGRAPHIC STUDIES:  No results found.   PATHOLOGY:     ASSESSMENT and THERAPY PLAN:   No problem-specific Assessment & Plan notes found for this encounter.   No orders of the defined types were placed in this encounter.   All questions were answered. The patient knows to call the clinic with any problems, questions or concerns. We can certainly see the patient much sooner if necessary. This note was electronically signed. Scot Dock, NP 10/21/2022

## 2022-11-05 LAB — COLOGUARD

## 2022-11-10 ENCOUNTER — Other Ambulatory Visit: Payer: Self-pay | Admitting: Nurse Practitioner

## 2022-11-10 DIAGNOSIS — F3160 Bipolar disorder, current episode mixed, unspecified: Secondary | ICD-10-CM

## 2022-11-10 DIAGNOSIS — R630 Anorexia: Secondary | ICD-10-CM

## 2022-11-10 MED ORDER — MIRTAZAPINE 7.5 MG PO TABS: 7.5000 mg | ORAL_TABLET | Freq: Every day | ORAL | 1 refills | Status: DC

## 2022-12-16 LAB — COLOGUARD

## 2023-01-06 DIAGNOSIS — Z1211 Encounter for screening for malignant neoplasm of colon: Secondary | ICD-10-CM | POA: Diagnosis not present

## 2023-01-13 LAB — COLOGUARD: COLOGUARD: NEGATIVE

## 2023-01-18 ENCOUNTER — Telehealth: Payer: Self-pay | Admitting: Nurse Practitioner

## 2023-01-18 NOTE — Telephone Encounter (Signed)
Pt wants you specifically wants you to call her about her Color guard results'

## 2023-01-19 NOTE — Telephone Encounter (Signed)
Pt was advised [facility] 

## 2023-02-01 ENCOUNTER — Ambulatory Visit: Payer: Self-pay | Admitting: Nurse Practitioner

## 2023-07-15 ENCOUNTER — Other Ambulatory Visit: Payer: Self-pay

## 2023-07-15 DIAGNOSIS — I10 Essential (primary) hypertension: Secondary | ICD-10-CM

## 2023-07-15 MED ORDER — HYDROCHLOROTHIAZIDE 12.5 MG PO CAPS: 12.5000 mg | ORAL_CAPSULE | Freq: Every day | ORAL | 0 refills | Status: DC

## 2023-08-26 ENCOUNTER — Other Ambulatory Visit: Payer: Self-pay

## 2023-09-06 ENCOUNTER — Other Ambulatory Visit: Payer: No Typology Code available for payment source

## 2023-09-06 DIAGNOSIS — E782 Mixed hyperlipidemia: Secondary | ICD-10-CM

## 2023-09-07 ENCOUNTER — Other Ambulatory Visit: Payer: Self-pay | Admitting: Nurse Practitioner

## 2023-09-07 DIAGNOSIS — E782 Mixed hyperlipidemia: Secondary | ICD-10-CM

## 2023-09-07 LAB — LIPID PANEL
Chol/HDL Ratio: 6.4 {ratio} — ABNORMAL HIGH (ref 0.0–4.4)
Cholesterol, Total: 300 mg/dL — ABNORMAL HIGH (ref 100–199)
HDL: 47 mg/dL (ref >39)
LDL Chol Calc (NIH): 207 mg/dL — ABNORMAL HIGH (ref 0–99)
Triglycerides: 235 mg/dL — ABNORMAL HIGH (ref 0–149)
VLDL Cholesterol Cal: 46 mg/dL — ABNORMAL HIGH (ref 5–40)

## 2023-09-07 LAB — CALCIUM: Calcium: 10.6 mg/dL — ABNORMAL HIGH (ref 8.7–10.3)

## 2023-09-07 MED ORDER — ATORVASTATIN CALCIUM 40 MG PO TABS: 40.0000 mg | ORAL_TABLET | Freq: Every day | ORAL | 0 refills | Status: AC

## 2023-09-08 ENCOUNTER — Telehealth: Payer: Self-pay | Admitting: Nurse Practitioner

## 2023-09-08 NOTE — Telephone Encounter (Signed)
Copied from CRM [id number]. Topic: Clinical - Lab/Test Results >> Sep 08, 2023  2:42 PM Catherine [name] wrote: Reason for CRM: Provided results to patient. She stated that she will make an appt when she is ready to come and be seen, and prefers not to see Dr. Geoffery Spruce. She is requesting that her lab results be mailed to the home address on file. If this can't be done, she would like to be contacted.

## 2023-09-16 ENCOUNTER — Other Ambulatory Visit: Payer: Self-pay

## 2023-09-16 DIAGNOSIS — I10 Essential (primary) hypertension: Secondary | ICD-10-CM

## 2023-09-16 MED ORDER — HYDROCHLOROTHIAZIDE 12.5 MG PO CAPS: 12.5000 mg | ORAL_CAPSULE | Freq: Every day | ORAL | 1 refills | Status: DC

## 2023-09-19 ENCOUNTER — Other Ambulatory Visit: Payer: Self-pay | Admitting: Nurse Practitioner

## 2023-09-19 DIAGNOSIS — F3160 Bipolar disorder, current episode mixed, unspecified: Secondary | ICD-10-CM

## 2023-09-19 DIAGNOSIS — R630 Anorexia: Secondary | ICD-10-CM

## 2023-09-19 MED ORDER — MIRTAZAPINE 7.5 MG PO TABS: 7.5000 mg | ORAL_TABLET | Freq: Every day | ORAL | 1 refills | Status: AC

## 2023-09-19 NOTE — Telephone Encounter (Signed)
 Copied from CRM [id number]. Topic: Clinical - Medication Refill >> Sep 19, 2023  3:57 PM Bronson Canny wrote: Most Recent Primary Care Visit:  Provider: [facility]  Department: [facility]  Visit Type: LAB VISIT  Date: 09/06/2023  Medication: mirtazapine  (REMERON ) 7.5 MG tablet [[id number]]  Has the patient contacted their pharmacy? Yes (Agent: If no, request that the patient contact the pharmacy for the refill. If patient does not wish to contact the pharmacy document the reason why and proceed with request.) (Agent: If yes, when and what did the pharmacy advise?)  Is this the correct pharmacy for this prescription? Yes If no, delete pharmacy and type the correct one.  This is the patient's preferred pharmacy:  [facility] [phone] - [place], [place] - [address] AT [facility] [address] [place] [address] Phone: [phone] Fax: [phone]   Has the prescription been filled recently? No  Is the patient out of the medication? Yes  Has the patient been seen for an appointment in the last year OR does the patient have an upcoming appointment? Yes  Can we respond through MyChart? Yes  Agent: Please be advised that Rx refills may take up to 3 business days. We ask that you follow-up with your pharmacy.

## 2023-09-19 NOTE — Telephone Encounter (Signed)
 Copied from CRM [id number]. Topic: Clinical - Prescription Issue >> Sep 19, 2023  3:52 PM Bronson Canny wrote: Reason for CRM: mirtazapine  (REMERON ) 7.5 MG tablet [[id number]] Patient states she has been taking medication for an extended period of time. Patient needs refill before withdrawal symptoms begins.

## 2023-10-17 ENCOUNTER — Other Ambulatory Visit: Payer: Self-pay | Admitting: Nurse Practitioner

## 2023-10-17 DIAGNOSIS — I10 Essential (primary) hypertension: Secondary | ICD-10-CM

## 2023-10-26 NOTE — Telephone Encounter (Signed)
 Copied from CRM [id number]. Topic: General - Call Back - No Documentation >> Oct 26, 2023 10:23 AM Nyra Capes wrote: Reason for CRM: patient calling in about a missed call, patient is inquiring about the call. Patient DOES NOT  have Voice mail.

## 2023-11-03 ENCOUNTER — Telehealth: Payer: Self-pay

## 2023-11-03 ENCOUNTER — Other Ambulatory Visit: Payer: Self-pay

## 2023-11-03 DIAGNOSIS — I10 Essential (primary) hypertension: Secondary | ICD-10-CM

## 2023-11-03 MED ORDER — HYDROCHLOROTHIAZIDE 12.5 MG PO CAPS: 12.5000 mg | ORAL_CAPSULE | Freq: Every day | ORAL | 1 refills | Status: AC

## 2023-11-03 NOTE — Telephone Encounter (Signed)
 Medication sent.

## 2023-12-02 ENCOUNTER — Encounter: Payer: Self-pay | Admitting: Nurse Practitioner

## 2023-12-09 ENCOUNTER — Encounter: Payer: Self-pay | Admitting: Nurse Practitioner
